# Patient Record
Sex: Female | Born: 1965
Health system: Southern US, Community
[De-identification: ages and names within clinical notes are randomized; demographics above are authoritative.]

## PROBLEM LIST (undated history)

## (undated) DIAGNOSIS — I10 Essential (primary) hypertension: Secondary | ICD-10-CM

## (undated) DIAGNOSIS — D573 Sickle-cell trait: Secondary | ICD-10-CM

## (undated) HISTORY — DX: Sickle-cell trait: D57.3

## (undated) HISTORY — PX: DIAGNOSTIC LAPAROSCOPY: SUR761

## (undated) HISTORY — PX: ABDOMINAL HYSTERECTOMY: SHX81

---

## 1993-12-20 HISTORY — PX: TUBAL LIGATION: SHX77

## 2006-12-01 ENCOUNTER — Ambulatory Visit: Payer: Self-pay | Admitting: Obstetrics & Gynecology

## 2006-12-01 ENCOUNTER — Encounter: Payer: Self-pay | Admitting: Obstetrics & Gynecology

## 2006-12-22 ENCOUNTER — Ambulatory Visit: Payer: Self-pay | Admitting: Obstetrics & Gynecology

## 2009-08-10 ENCOUNTER — Emergency Department: Payer: Self-pay | Admitting: Emergency Medicine

## 2011-05-04 NOTE — Assessment & Plan Note (Signed)
NAME:  Stephanie Jacobs, Stephanie Jacobs              ACCOUNT NO.:  [id number]   MEDICAL RECORD NO.:  [id number]          PATIENT TYPE:  POB   LOCATION:  [facility]         FACILITY:  [facility]   PHYSICIAN:  Elsie Lincoln, MD      DATE OF BIRTH:  1966/08/02   DATE OF SERVICE:  12/01/2006                                  CLINIC NOTE   The patient is a 45 year old para 4 female who presents for evaluation  of abnormal uterine bleeding in the past year but it has gotten worse  over the past 2 months.  The patient had regular menses up until this  point and started to have more frequent long periods of bleeding.  The  past 2 months, the patient had worsening of her symptoms.  On 10/10, she  had a period from the 10th to approximately the 14th.  In November, she  started early November and ended early December.  The patient does not  state any cramping, nausea, vomiting or change in urinary habits.   PAST MEDICAL HISTORY:  1. Hypertension but the patient does not take her medications for it.      She used to be on Toprol XL 50 mg a day; however, when she started      to feel better and after she had a negative stress test and MRI of      her heart, she decided she was not taking it.  Today, her blood      pressure is 170/106 and a recheck was 160/108.  The patient needs      to see her MD immediately for this and we will give her a      prescription for Toprol XL 50 to take until she talks to her      doctor.  2. She also as history of borderline diabetes but she lost some weight      and she said her last test was normal.   PAST SURGICAL HISTORY:  Bilateral tubal ligation.   GYN HISTORY:  Four vaginal deliveries.  No STDs.  No fibroids.  No  cysts.  Never had a transvaginal ultrasound.  She had __________  13  years ago for abnormal Pap smear and Pap smears have been normal since.   FAMILY HISTORY:  Negative for familial GYN cancer or breast cancer.   ALLERGIES:  Denies latex or medication  allergies.   REVIEW OF SYSTEMS:  Negative.   LABS TODAY:  UBT is negative.   PHYSICAL EXAMINATION:  VITAL SIGNS:  Blood pressure 170/106, recheck  160/108, weight 201, pulse 71.  GENERAL:  Well developed, well nourished adult in no apparent distress.  ABDOMEN:  Soft, slightly obese, nontender.  No rebound or guarding.  GENITALIA:  Tanner 5.  Vagina pink.  Normal rugae.  No blood or  discharge.  Bladder and urethra well-suspended and supported.  Cervix  closed, nontender.  Uterus approximately 8 weeks size, mobile, anterior.  Adnexa:  No masses nontender.  Perineum intact.   ASSESSMENT/PLAN:  A 45 year old female with menorrhagia.  1. The patient is at an age where pre-cancer and cancer of the uterus      need to  be ruled out, so endometrial biopsy was done today.  Good      tissue was seen in the pathology specimen.  UBT negative.  2. Uncontrolled hypertension.  Toprol XL given.  The patient to call      her doctor today, immediately.  No refills were given on this      medication.  3. Pap smear done as her last one was done in 11/06.  4. Return to clinic in 2 weeks for results.           ______________________________  Elsie Lincoln, MD     KL/MEDQ  D:  12/01/2006  T:  12/01/2006  Job:  [id number]

## 2011-05-07 NOTE — Assessment & Plan Note (Signed)
Stephanie Jacobs, Stephanie Jacobs              ACCOUNT NO.:  [id number]   MEDICAL RECORD NO.:  [id number]          PATIENT TYPE:  POB   LOCATION:  [facility]         FACILITY:  [facility]   PHYSICIAN:  Carolanne Grumbling, M.D.   DATE OF BIRTH:  07-26-1966   DATE OF SERVICE:  12/22/2006                                  CLINIC NOTE   A 45 year old para 4 presents for results of workup for abnormal uterine  bleeding.  She had an endometrial biopsy done at her last office visit  that showed benign proliferative endometrium, no hyperplasia or  malignancy.  Pap smear was also negative.  Since her office visit, she  had another period, December 26th through December 31st, that was  normal, but it was her second one of the month.   PHYSICAL EXAMINATION:  VITAL SIGNS:  Today on exam, patient was found to  have a blood pressure of 156/106, pulse 74.  GENERAL:  She is a well-developed, well-nourished female in no apparent  distress.  ABDOMEN:  Obese.  GU:  Normal external genitalia, cervix multiparous.  GC/CT collected.   IMPRESSION:  1. Menorrhagia with normal endometrial biopsy.  Patient was counseled      that her options included nonestrogen methods, including Depo-      Provera or a Mirena IUD.  Patient would like to proceed with      getting the Mirena.  She is to come back in approximately 2 weeks      for the insertion.  Handout was given and all her questions were      answered.  2. Hypertension.  Patient is being seen by a primary care physician.      She is on lisinopril and hydrochlorothiazide.  3. Patient was discussed with Dr. Penne Lash who agrees with the      assessment and plan.           ______________________________  Carolanne Grumbling, M.D.     TW/MEDQ  D:  12/22/2006  T:  12/22/2006  Job:  [id number]

## 2012-03-05 ENCOUNTER — Emergency Department: Payer: Self-pay | Admitting: Emergency Medicine

## 2012-03-05 LAB — TSH: Thyroid Stimulating Horm: 5.01 u[IU]/mL — ABNORMAL HIGH

## 2012-03-05 LAB — URINALYSIS, COMPLETE
Bacteria: NONE SEEN
Bilirubin,UR: NEGATIVE
Glucose,UR: NEGATIVE mg/dL (ref 0–75)
Ketone: NEGATIVE
Leukocyte Esterase: NEGATIVE
RBC,UR: <1 /HPF (ref 0–5)
Squamous Epithelial: NONE SEEN

## 2012-03-05 LAB — CK TOTAL AND CKMB (NOT AT ARMC): CK, Total: 239 U/L — ABNORMAL HIGH (ref 21–215)

## 2012-03-05 LAB — PROTIME-INR
INR: 0.9
Prothrombin Time: 12.1 secs (ref 11.5–14.7)

## 2012-03-05 LAB — COMPREHENSIVE METABOLIC PANEL
Albumin: 4.7 g/dL (ref 3.4–5.0)
Alkaline Phosphatase: 66 U/L (ref 50–136)
Calcium, Total: 8.9 mg/dL (ref 8.5–10.1)
Co2: 23 mmol/L (ref 21–32)
EGFR (Non-African Amer.): >60
Glucose: 90 mg/dL (ref 65–99)
SGPT (ALT): 44 U/L

## 2012-03-05 LAB — PREGNANCY, URINE: Pregnancy Test, Urine: NEGATIVE m[IU]/mL

## 2014-11-18 ENCOUNTER — Ambulatory Visit: Payer: Self-pay | Admitting: Family Medicine

## 2015-03-11 LAB — CBC AND DIFFERENTIAL
HCT: 42 % (ref 36–46)
Hemoglobin: 14.7 g/dL (ref 12.0–16.0)
PLATELETS: 303 10*3/uL (ref 150–399)
WBC: 10 10^3/mL

## 2015-03-11 LAB — HEMOGLOBIN A1C: HEMOGLOBIN A1C: 5.2 % (ref 4.0–6.0)

## 2015-03-11 LAB — HEPATIC FUNCTION PANEL
ALT: 30 U/L (ref 7–35)
AST: 35 U/L (ref 13–35)

## 2015-04-08 LAB — BASIC METABOLIC PANEL
BUN: 7 mg/dL (ref 4–21)
CREATININE: 0.7 mg/dL (ref <=1.1)
GLUCOSE: 94 mg/dL
Potassium: 3.9 mmol/L (ref 3.4–5.3)
Sodium: 137 mmol/L (ref 137–147)

## 2015-05-07 DIAGNOSIS — R739 Hyperglycemia, unspecified: Secondary | ICD-10-CM | POA: Insufficient documentation

## 2015-05-07 DIAGNOSIS — R945 Abnormal results of liver function studies: Secondary | ICD-10-CM | POA: Insufficient documentation

## 2015-05-07 DIAGNOSIS — F419 Anxiety disorder, unspecified: Secondary | ICD-10-CM | POA: Insufficient documentation

## 2015-05-07 DIAGNOSIS — R7989 Other specified abnormal findings of blood chemistry: Secondary | ICD-10-CM | POA: Insufficient documentation

## 2015-05-07 DIAGNOSIS — Z7189 Other specified counseling: Secondary | ICD-10-CM | POA: Insufficient documentation

## 2015-05-07 DIAGNOSIS — I10 Essential (primary) hypertension: Secondary | ICD-10-CM | POA: Insufficient documentation

## 2015-05-23 ENCOUNTER — Other Ambulatory Visit: Payer: Self-pay | Admitting: Family Medicine

## 2015-05-23 DIAGNOSIS — I10 Essential (primary) hypertension: Secondary | ICD-10-CM

## 2015-07-09 ENCOUNTER — Other Ambulatory Visit: Payer: Self-pay | Admitting: Family Medicine

## 2015-07-09 ENCOUNTER — Ambulatory Visit: Payer: Self-pay | Admitting: Family Medicine

## 2015-07-09 DIAGNOSIS — I10 Essential (primary) hypertension: Secondary | ICD-10-CM

## 2015-07-09 NOTE — Telephone Encounter (Signed)
Last OV 03/2015;  She missed today's appointment.   Thanks,   -Mickel Baas

## 2015-08-20 ENCOUNTER — Emergency Department
Admission: EM | Admit: 2015-08-20 | Discharge: 2015-08-20 | Discharge status: Against Medical Advice | Payer: BLUE CROSS/BLUE SHIELD | Attending: Emergency Medicine | Admitting: Emergency Medicine

## 2015-08-20 ENCOUNTER — Encounter: Payer: Self-pay | Admitting: *Deleted

## 2015-08-20 DIAGNOSIS — R040 Epistaxis: Secondary | ICD-10-CM | POA: Diagnosis present

## 2015-08-20 NOTE — ED Notes (Addendum)
Pt states while in the bathroom tonight, her nose started bleeding from both nares.  Pt reports sinus pressure and congestion this week.  cig smoker.   Pt has nose clip in place.  No bleeding presently in triage.   Blood pressure elevated.  Pt reports no bp meds for past 2 days.  No headache.  Alert.  Speech clear.

## 2015-08-20 NOTE — ED Notes (Signed)
Pt ambulatory to STAT without difficulty or distress; reports nosebleed today; currently bleeding for 15min, bilat; st recent ?sinus congestion/allergies; noseclamp applied

## 2015-09-12 ENCOUNTER — Telehealth: Payer: Self-pay | Admitting: Family Medicine

## 2015-09-12 NOTE — Telephone Encounter (Signed)
Left message requesting pt make an OV. Renaldo Fiddler, CMA

## 2015-09-12 NOTE — Telephone Encounter (Signed)
Pt is calling to request a lab slip.  Pt states she is having cramps in her leg, toes and sides. Pt also states her urine is dark. Pt is asking if this is coming from her medication? FD#[phone]/[id number]

## 2018-01-19 ENCOUNTER — Ambulatory Visit (INDEPENDENT_AMBULATORY_CARE_PROVIDER_SITE_OTHER): Payer: BLUE CROSS/BLUE SHIELD | Admitting: Physician Assistant

## 2018-01-19 ENCOUNTER — Other Ambulatory Visit: Payer: Self-pay | Admitting: Physician Assistant

## 2018-01-19 ENCOUNTER — Encounter: Payer: Self-pay | Admitting: Physician Assistant

## 2018-01-19 VITALS — BP 164/102 | HR 72 | Temp 98.5°F | Resp 16 | Ht 65.0 in | Wt 206.0 lb

## 2018-01-19 DIAGNOSIS — Z13220 Encounter for screening for lipoid disorders: Secondary | ICD-10-CM

## 2018-01-19 DIAGNOSIS — I10 Essential (primary) hypertension: Secondary | ICD-10-CM

## 2018-01-19 DIAGNOSIS — Z130 Encounter for screening for diseases of the blood and blood-forming organs and certain disorders involving the immune mechanism: Secondary | ICD-10-CM | POA: Diagnosis not present

## 2018-01-19 DIAGNOSIS — R739 Hyperglycemia, unspecified: Secondary | ICD-10-CM

## 2018-01-19 DIAGNOSIS — Z1329 Encounter for screening for other suspected endocrine disorder: Secondary | ICD-10-CM

## 2018-01-19 MED ORDER — AMLODIPINE BESYLATE 5 MG PO TABS: 5.0000 mg | ORAL_TABLET | Freq: Every day | ORAL | 0 refills | Status: DC

## 2018-01-19 NOTE — Patient Instructions (Signed)

## 2018-01-19 NOTE — Progress Notes (Signed)
Patient: Stephanie Jacobs Female    DOB: 29-May-1966   52 y.o.   MRN: [id number] Visit Date: 01/20/2018  Today's Provider: Trinna Post, PA-C   Chief Complaint  Patient presents with  . Hypertension   Subjective:    Stephanie Jacobs is a 52 y/o woman here today for follow up. Last visit was 03/2015 with Dr. Venia Minks. She is living in [place] with her granddaughter and younger son. She has four kids, all grown. One daughter with MS.  Works as Marine scientist at [employer].  She has smoked 1 pack of cigarettes per day since age 52, total 70 years. Drinks glass of wine per night.   Last PAP listed in chart as 2015 though I cannot find lab report.   She has a history of HTN. She was previously on 10 mg amlodipine QD and Lisinopril-HCTZ 12-25 mg QD for this. Last visit 03/2015 where, per chart review in [place], Dr. Venia Minks advised her to continue medications. The patient relates that she "resolved" her hypertension and was discontinued from her medications which is why she hasn't been taking them.  She reports regular but heavy periods. Had ultrasound in 2008 to check for fibroids and there were none.  Hypertension  This is a chronic problem. The problem has been resolved since onset. The problem is controlled (Pt reports her BP is usually 120's/80's at home.). Pertinent negatives include no anxiety, blurred vision, chest pain, headaches, malaise/fatigue, neck pain, orthopnea, palpitations, peripheral edema, PND, shortness of breath or sweats.       Allergies  Allergen Reactions  . Tape      Current Outpatient Medications:  .  albuterol (PROVENTIL HFA;VENTOLIN HFA) 108 (90 BASE) MCG/ACT inhaler, Inhale into the lungs., Disp: , Rfl:  .  amLODipine (NORVASC) 5 MG tablet, Take 1 tablet (5 mg total) by mouth daily., Disp: 90 tablet, Rfl: 0 .  aspirin 81 MG tablet, Take by mouth., Disp: , Rfl:  .  lisinopril-hydrochlorothiazide (PRINZIDE,ZESTORETIC) 10-12.5 MG per  tablet, Take by mouth., Disp: , Rfl:   Review of Systems  Constitutional: Negative.  Negative for malaise/fatigue.  Eyes: Negative for blurred vision.  Respiratory: Negative for shortness of breath.   Cardiovascular: Negative for chest pain, palpitations, orthopnea and PND.  Musculoskeletal: Negative for neck pain.  Neurological: Negative for dizziness, light-headedness and headaches.   Family History  Problem Relation Age of Onset  . Hypertension Mother   . Diabetes Mother   . Sickle cell anemia Brother   . Sickle cell trait Brother    Past Surgical History:  Procedure Laterality Date  . TUBAL LIGATION  1995    Social History   Tobacco Use  . Smoking status: Current Every Day Smoker    Packs/day: 0.50    Types: Cigarettes  . Smokeless tobacco: Never Used  Substance Use Topics  . Alcohol use: Yes    Alcohol/week: 4.2 oz    Types: 7 Glasses of wine per week    Comment: One glass of red wine in the evenings.   Objective:   BP (!) 164/102 (BP Location: Right Arm, Patient Position: Sitting, Cuff Size: Normal)   Pulse 72   Temp 98.5 F (36.9 C) (Oral)   Resp 16   Ht 5\' 5"  (1.651 m)   Wt 206 lb (93.4 kg)   BMI 34.28 kg/m  Vitals:   01/19/18 1529  BP: (!) 164/102  Pulse: 72  Resp: 16  Temp: 98.5  F (36.9 C)  TempSrc: Oral  Weight: 206 lb (93.4 kg)  Height: 5\' 5"  (1.651 m)     Physical Exam  Constitutional: She appears well-developed and well-nourished.  Cardiovascular: Normal rate and regular rhythm.  Pulmonary/Chest: Effort normal and breath sounds normal.  Abdominal: Soft. Bowel sounds are normal.  Skin: Skin is warm and dry.  Psychiatric: She has a normal mood and affect. Her behavior is normal.        Assessment & Plan:     1. Hypertension, unspecified type  Hypertensive today. Last visit with Dr. Venia Minks 03/2015 BP was 140/100 and instructions were to continue medications and follow up. Patient not released from care for hypertension. Recommend  restarting medications and getting labwork. Will likely need multiple medications to control. Needs to stop smoking, increase exercise and focus on weight reduction. Return 1 mo to check BP, complete CPE w/ PAP.  - Comprehensive Metabolic Panel (CMET) - amLODipine (NORVASC) 5 MG tablet; Take 1 tablet (5 mg total) by mouth daily.  Dispense: 90 tablet; Refill: 0  2. Blood glucose elevated   3. Thyroid disorder screening  - TSH  4. Screening for deficiency anemia  - CBC with Differential  5. Lipid screening  - Lipid Profile  6. Hyperglycemia  - HgB A1c  Return in about 1 month (around 02/16/2018) for CPE w PAP.  The entirety of the information documented in the History of Present Illness, Review of Systems and Physical Exam were personally obtained by me. Portions of this information were initially documented by Ashley Royalty, CMA and reviewed by me for thoroughness and accuracy.        Trinna Post, PA-C  [facility]

## 2018-01-20 ENCOUNTER — Telehealth: Payer: Self-pay | Admitting: Emergency Medicine

## 2018-01-20 LAB — CBC WITH DIFFERENTIAL/PLATELET
Basophils Absolute: 0 10*3/uL (ref 0.0–0.2)
Basos: 0 %
EOS (ABSOLUTE): 0.2 10*3/uL (ref 0.0–0.4)
Eos: 2 %
Hematocrit: 43.3 % (ref 34.0–46.6)
Hemoglobin: 14.3 g/dL (ref 11.1–15.9)
Immature Grans (Abs): 0 10*3/uL (ref 0.0–0.1)
Immature Granulocytes: 0 %
Lymphocytes Absolute: 2.4 10*3/uL (ref 0.7–3.1)
Lymphs: 26 %
MCH: 30.1 pg (ref 26.6–33.0)
MCHC: 33 g/dL (ref 31.5–35.7)
MCV: 91 fL (ref 79–97)
Monocytes Absolute: 0.7 10*3/uL (ref 0.1–0.9)
Monocytes: 7 %
Neutrophils Absolute: 6.2 10*3/uL (ref 1.4–7.0)
Neutrophils: 65 %
Platelets: 293 10*3/uL (ref 150–379)
RBC: 4.75 x10E6/uL (ref 3.77–5.28)
RDW: 15.4 % (ref 12.3–15.4)
WBC: 9.4 10*3/uL (ref 3.4–10.8)

## 2018-01-20 LAB — COMPREHENSIVE METABOLIC PANEL
ALT: 38 IU/L — ABNORMAL HIGH (ref 0–32)
AST: 37 IU/L (ref 0–40)
Albumin/Globulin Ratio: 1.7 (ref 1.2–2.2)
Albumin: 4.8 g/dL (ref 3.5–5.5)
Alkaline Phosphatase: 61 IU/L (ref 39–117)
BUN/Creatinine Ratio: 11 (ref 9–23)
BUN: 8 mg/dL (ref 6–24)
Bilirubin Total: 0.7 mg/dL (ref 0.0–1.2)
CO2: 23 mmol/L (ref 20–29)
Calcium: 9.3 mg/dL (ref 8.7–10.2)
Chloride: 101 mmol/L (ref 96–106)
Creatinine, Ser: 0.73 mg/dL (ref 0.57–1.00)
GFR calc Af Amer: 112 mL/min/{1.73_m2} (ref >59)
GFR calc non Af Amer: 97 mL/min/{1.73_m2} (ref >59)
Globulin, Total: 2.9 g/dL (ref 1.5–4.5)
Glucose: 90 mg/dL (ref 65–99)
Potassium: 3.8 mmol/L (ref 3.5–5.2)
Sodium: 139 mmol/L (ref 134–144)
Total Protein: 7.7 g/dL (ref 6.0–8.5)

## 2018-01-20 LAB — LIPID PANEL
Chol/HDL Ratio: 2.8 ratio (ref 0.0–4.4)
Cholesterol, Total: 182 mg/dL (ref 100–199)
HDL: 65 mg/dL (ref >39)
LDL Calculated: 101 mg/dL — ABNORMAL HIGH (ref 0–99)
Triglycerides: 82 mg/dL (ref 0–149)
VLDL Cholesterol Cal: 16 mg/dL (ref 5–40)

## 2018-01-20 LAB — TSH: TSH: 1.62 u[IU]/mL (ref 0.450–4.500)

## 2018-01-20 LAB — HEMOGLOBIN A1C
Est. average glucose Bld gHb Est-mCnc: 105 mg/dL
Hgb A1c MFr Bld: 5.3 % (ref 4.8–5.6)

## 2018-01-20 NOTE — Telephone Encounter (Signed)
LMTCB

## 2018-01-20 NOTE — Telephone Encounter (Signed)
-----   Message from Trinna Post, [place] sent at 01/20/2018  1:26 PM EST ----- CBC and CMET normal. Cholesterol well controlled, A1c normal. TSH normal.

## 2018-01-23 ENCOUNTER — Telehealth: Payer: Self-pay

## 2018-01-23 NOTE — Telephone Encounter (Signed)
Tried calling patient and number was busy. Will try again later.

## 2018-01-23 NOTE — Telephone Encounter (Signed)
Advised patient of results.  

## 2018-01-23 NOTE — Telephone Encounter (Signed)
-----   Message from Trinna Post, [place] sent at 01/20/2018  1:26 PM EST ----- CBC and CMET normal. Cholesterol well controlled, A1c normal. TSH normal.

## 2018-02-22 ENCOUNTER — Ambulatory Visit (INDEPENDENT_AMBULATORY_CARE_PROVIDER_SITE_OTHER): Payer: BLUE CROSS/BLUE SHIELD | Admitting: Physician Assistant

## 2018-02-22 ENCOUNTER — Encounter: Payer: Self-pay | Admitting: Physician Assistant

## 2018-02-22 VITALS — BP 162/92 | HR 90 | Temp 98.6°F | Resp 16 | Ht 63.0 in | Wt 205.0 lb

## 2018-02-22 DIAGNOSIS — Z1239 Encounter for other screening for malignant neoplasm of breast: Secondary | ICD-10-CM

## 2018-02-22 DIAGNOSIS — I10 Essential (primary) hypertension: Secondary | ICD-10-CM

## 2018-02-22 DIAGNOSIS — Z0001 Encounter for general adult medical examination with abnormal findings: Secondary | ICD-10-CM

## 2018-02-22 DIAGNOSIS — Z720 Tobacco use: Secondary | ICD-10-CM

## 2018-02-22 DIAGNOSIS — Z0000 Encounter for general adult medical examination without abnormal findings: Secondary | ICD-10-CM

## 2018-02-22 DIAGNOSIS — Z1211 Encounter for screening for malignant neoplasm of colon: Secondary | ICD-10-CM | POA: Diagnosis not present

## 2018-02-22 DIAGNOSIS — Z124 Encounter for screening for malignant neoplasm of cervix: Secondary | ICD-10-CM

## 2018-02-22 DIAGNOSIS — F1010 Alcohol abuse, uncomplicated: Secondary | ICD-10-CM

## 2018-02-22 MED ORDER — BUPROPION HCL ER (SR) 150 MG PO TB12: ORAL_TABLET | ORAL | 0 refills | Status: DC

## 2018-02-22 MED ORDER — AMLODIPINE BESYLATE 10 MG PO TABS: 10.0000 mg | ORAL_TABLET | Freq: Every day | ORAL | 3 refills | Status: DC

## 2018-02-22 NOTE — Progress Notes (Signed)
Patient: Stephanie Jacobs, Female    DOB: August 04, 1966, 52 y.o.   MRN: [id number] Visit Date: 02/23/2018  Today's Provider: Trinna Post, PA-C   Chief Complaint  Patient presents with  . Annual Exam   Subjective:     Annual physical exam Stephanie Jacobs is a 52 y.o. female who presents today for health maintenance and complete physical. She feels well. She reports exercising not regularly. She reports she is sleeping well.  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 She is in a relationship. She has grown children.  She is smoking currently and is interested in quitting. Has tried the patch and Chantix previously without success. She is interested in Wellbutrin today.   She is due for a mammogram and PAP today but she has started her period.   She is wondering about 81 mg ASA daily.   She drinks three glasses of wine daily.   Hypertension, follow-up:  BP Readings from Last 3 Encounters:  02/22/18 (!) 162/92  01/19/18 (!) 164/102  08/20/15 (!) 176/110    She was last seen for hypertension 2 months ago.  BP at that visit was 164/102. Management since that visit includes restarting amlodipine 5mg  daily. She reports good compliance with treatment. She is not having side effects.  She is not exercising. She is adherent to low salt diet.   Outside blood pressures are not being checked. She is experiencing none.  Patient denies exertional chest pressure/discomfort, lower extremity edema and palpitations.   Cardiovascular risk factors include obesity (BMI >= 30 kg/m2) and smoking/ tobacco exposure.      Weight trend: stable Wt Readings from Last 3 Encounters:  02/22/18 205 lb (93 kg)  01/19/18 206 lb (93.4 kg)  08/20/15 193 lb (87.5 kg)    Current diet: well balanced    Review of Systems  Constitutional: Negative.   HENT: Negative.   Eyes: Negative.   Respiratory: Negative.   Cardiovascular: Negative.     Gastrointestinal: Negative.   Endocrine: Negative.   Genitourinary: Negative.   Musculoskeletal: Negative.   Skin: Negative.   Allergic/Immunologic: Negative.   Neurological: Negative.   Hematological: Negative.   Psychiatric/Behavioral: Negative.     Social History      She  reports that she has been smoking cigarettes.  She has been smoking about 0.50 packs per day. she has never used smokeless tobacco. She reports that she drinks about 4.2 oz of alcohol per week. She reports that she does not use drugs.       Social History   Socioeconomic History  . Marital status: Single    Spouse name: None  . Number of children: None  . Years of education: None  . Highest education level: None  Social Needs  . Financial resource strain: None  . Food insecurity - worry: None  . Food insecurity - inability: None  . Transportation needs - medical: None  . Transportation needs - non-medical: None  Occupational History  . None  Tobacco Use  . Smoking status: Current Every Day Smoker    Packs/day: 0.50    Types: Cigarettes  . Smokeless tobacco: Never Used  Substance and Sexual Activity  . Alcohol use: Yes    Alcohol/week: 4.2 oz    Types: 7 Glasses of wine per week    Comment: One glass of red wine in the evenings.  . Drug use: No  .  Sexual activity: No  Other Topics Concern  . None  Social History Narrative  . None    Past Medical History:  Diagnosis Date  . Sickle cell trait [facility])      Patient Active Problem List   Diagnosis Date Noted  . Anxiety 05/07/2015  . Abnormal LFTs 05/07/2015  . Essential (primary) hypertension 05/07/2015  . Blood glucose elevated 05/07/2015  . Counseling on substance use and abuse 05/07/2015    Past Surgical History:  Procedure Laterality Date  . TUBAL LIGATION  1995    Family History        Family Status  Relation Name Status  . Mother  Alive  . Father  Alive  . Brother  Deceased  . Brother  (Not Specified)        Her family  history includes Diabetes in her mother; Hypertension in her mother; Sickle cell anemia in her brother; Sickle cell trait in her brother.      Allergies  Allergen Reactions  . Tape      Current Outpatient Medications:  .  albuterol (PROVENTIL HFA;VENTOLIN HFA) 108 (90 BASE) MCG/ACT inhaler, Inhale into the lungs., Disp: , Rfl:  .  aspirin 81 MG tablet, Take by mouth., Disp: , Rfl:  .  amLODipine (NORVASC) 10 MG tablet, Take 1 tablet (10 mg total) by mouth daily., Disp: 90 tablet, Rfl: 3 .  buPROPion (WELLBUTRIN SR) 150 MG 12 hr tablet, Take one pill daily for three days. On day 4, take one pill twice daily for remainder of treatment, Disp: 180 tablet, Rfl: 0 .  lisinopril-hydrochlorothiazide (PRINZIDE,ZESTORETIC) 10-12.5 MG per tablet, Take by mouth., Disp: , Rfl:    Patient Care Team: Paulene Floor as PCP - General (Physician Assistant)      Objective:   Vitals: BP (!) 162/92 (BP Location: Right Arm, Patient Position: Sitting, Cuff Size: Large)   Pulse 90   Temp 98.6 F (37 C)   Resp 16   Ht 5\' 3"  (1.6 m)   Wt 205 lb (93 kg)   SpO2 96%   BMI 36.31 kg/m    Vitals:   02/22/18 1408  BP: (!) 162/92  Pulse: 90  Resp: 16  Temp: 98.6 F (37 C)  SpO2: 96%  Weight: 205 lb (93 kg)  Height: 5\' 3"  (1.6 m)     Physical Exam  Constitutional: She is oriented to person, place, and time. She appears well-developed and well-nourished.  Cardiovascular: Normal rate and regular rhythm.  Pulmonary/Chest: Effort normal and breath sounds normal.  Abdominal: Soft. Bowel sounds are normal.  Neurological: She is alert and oriented to person, place, and time.  Skin: Skin is warm and dry.  Psychiatric: She has a normal mood and affect. Her behavior is normal.     Depression Screen PHQ 2/9 Scores 02/22/2018  PHQ - 2 Score 0      Assessment & Plan:     Routine Health Maintenance and Physical Exam  Exercise Activities and Dietary recommendations Goals    None        There is no immunization history on file for this patient.  Health Maintenance  Topic Date Due  . HIV Screening  12/07/1983  . TETANUS/TDAP  12/07/1987  . INFLUENZA VACCINE  07/20/2018 (Originally 07/20/2017)  . PAP SMEAR  01/18/2019     Discussed health benefits of physical activity, and encouraged her to engage in regular exercise appropriate for her age and condition.    1. Annual physical exam  2. Breast cancer screening  Deferred breast and PAP to follow up visit due to menstrual cycle.  - MM Digital Screening; Future  3. Cervical cancer screening  See #2  4. Hypertension, unspecified type  BP slightly decreased on amlodipine. Will increase to 10 mg and see back next week. Anticipate having to add back Lisinopril- HCTZ.  - amLODipine (NORVASC) 10 MG tablet; Take 1 tablet (10 mg total) by mouth daily.  Dispense: 90 tablet; Refill: 3  5. Tobacco abuse  I have counseled >74min on smoking cessation.  - buPROPion (WELLBUTRIN SR) 150 MG 12 hr tablet; Take one pill daily for three days. On day 4, take one pill twice daily for remainder of treatment  Dispense: 180 tablet; Refill: 0  6. Excessive drinking alcohol  Counseled that serving of alcohol is 5 oz wine daily for women.  7. Colon cancer screening  She will be 52 in December.  - Ambulatory referral to Gastroenterology  Return in about 2 weeks (around 03/08/2018) for HTN, breast exam, PAP.  The entirety of the information documented in the History of Present Illness, Review of Systems and Physical Exam were personally obtained by me. Portions of this information were initially documented by Jacqualine Code, CMA and reviewed by me for thoroughness and accuracy.      Trinna Post, PA-C  [facility]

## 2018-02-23 NOTE — Patient Instructions (Signed)

## 2018-03-10 ENCOUNTER — Encounter: Payer: Self-pay | Admitting: Physician Assistant

## 2018-03-10 ENCOUNTER — Ambulatory Visit (INDEPENDENT_AMBULATORY_CARE_PROVIDER_SITE_OTHER): Payer: BLUE CROSS/BLUE SHIELD | Admitting: Physician Assistant

## 2018-03-10 VITALS — BP 144/96 | HR 84 | Temp 98.3°F | Resp 16 | Wt 208.0 lb

## 2018-03-10 DIAGNOSIS — Z124 Encounter for screening for malignant neoplasm of cervix: Secondary | ICD-10-CM

## 2018-03-10 DIAGNOSIS — I10 Essential (primary) hypertension: Secondary | ICD-10-CM

## 2018-03-10 MED ORDER — LISINOPRIL-HYDROCHLOROTHIAZIDE 10-12.5 MG PO TABS: 1.0000 | ORAL_TABLET | Freq: Every day | ORAL | 3 refills | Status: DC

## 2018-03-10 NOTE — Patient Instructions (Signed)

## 2018-03-10 NOTE — Progress Notes (Signed)
Patient: Stephanie Jacobs Female    DOB: 03/10/1966   52 y.o.   MRN: [id number] Visit Date: 03/10/2018  Today's Provider: Trinna Post, PA-C   Chief Complaint  Patient presents with  . Hypertension   Subjective:    Hypertension  This is a chronic problem. Pertinent negatives include no anxiety, blurred vision, chest pain, headaches, malaise/fatigue, neck pain, orthopnea, palpitations, peripheral edema, PND, shortness of breath or sweats. There are no associated agents to hypertension.       Allergies  Allergen Reactions  . Tape      Current Outpatient Medications:  .  amLODipine (NORVASC) 10 MG tablet, Take 1 tablet (10 mg total) by mouth daily., Disp: 90 tablet, Rfl: 3 .  albuterol (PROVENTIL HFA;VENTOLIN HFA) 108 (90 BASE) MCG/ACT inhaler, Inhale into the lungs., Disp: , Rfl:  .  aspirin 81 MG tablet, Take by mouth., Disp: , Rfl:  .  buPROPion (WELLBUTRIN SR) 150 MG 12 hr tablet, Take one pill daily for three days. On day 4, take one pill twice daily for remainder of treatment (Patient not taking: Reported on 03/10/2018), Disp: 180 tablet, Rfl: 0 .  lisinopril-hydrochlorothiazide (PRINZIDE,ZESTORETIC) 10-12.5 MG per tablet, Take by mouth., Disp: , Rfl:   Review of Systems  Constitutional: Negative for malaise/fatigue.  Eyes: Negative for blurred vision.  Respiratory: Negative for shortness of breath.   Cardiovascular: Negative for chest pain, palpitations, orthopnea and PND.  Musculoskeletal: Negative for neck pain.  Neurological: Negative for headaches.    Social History   Tobacco Use  . Smoking status: Current Every Day Smoker    Packs/day: 0.50    Types: Cigarettes  . Smokeless tobacco: Never Used  Substance Use Topics  . Alcohol use: Yes    Alcohol/week: 4.2 oz    Types: 7 Glasses of wine per week    Comment: One glass of red wine in the evenings.   Objective:   BP (!) 144/96 (BP Location: Right Arm, Patient Position: Sitting, Cuff Size: Large)    Pulse 84   Temp 98.3 F (36.8 C) (Oral)   Resp 16   Wt 208 lb (94.3 kg)   LMP 02/24/2018   BMI 36.85 kg/m  Vitals:   03/10/18 1408  BP: (!) 144/96  Pulse: 84  Resp: 16  Temp: 98.3 F (36.8 C)  TempSrc: Oral  Weight: 208 lb (94.3 kg)     Physical Exam  Cardiovascular: Normal rate and regular rhythm.  Pulmonary/Chest: Breath sounds normal. Right breast exhibits no inverted nipple, no mass, no nipple discharge, no skin change and no tenderness. Left breast exhibits no inverted nipple, no mass, no nipple discharge, no skin change and no tenderness. No breast swelling, tenderness, discharge or bleeding. Breasts are symmetrical.  Genitourinary: No labial fusion. There is no rash, tenderness, lesion or injury on the right labia. There is no rash, tenderness, lesion or injury on the left labia. Uterus is not deviated, not enlarged, not fixed and not tender. Cervix exhibits no motion tenderness, no discharge and no friability. Right adnexum displays no mass, no tenderness and no fullness. Left adnexum displays no mass, no tenderness and no fullness. No erythema, tenderness or bleeding in the vagina. No foreign body in the vagina. No signs of injury around the vagina. No vaginal discharge found.        Assessment & Plan:     1. Essential (primary) hypertension  HTN improved but still uncontrolled. Will have her add medication below,  which she was previously on. F/u 1 mo for BP check and labs.  - lisinopril-hydrochlorothiazide (PRINZIDE,ZESTORETIC) 10-12.5 MG tablet; Take 1 tablet by mouth daily.  Dispense: 90 tablet; Refill: 3  2. Cervical cancer screening  Repeat 5 years if normal. Reminded to schedule mammogram.  - Pap IG and HPV (high risk) DNA detection  Return in about 1 month (around 04/10/2018) for HTN.  The entirety of the information documented in the History of Present Illness, Review of Systems and Physical Exam were personally obtained by me. Portions of this information  were initially documented by Ashley Royalty, CMA and reviewed by me for thoroughness and accuracy.        Trinna Post, PA-C  [facility]

## 2018-03-15 LAB — PAP IG AND HPV HIGH-RISK
HPV, high-risk: NEGATIVE
PAP Smear Comment: 0

## 2018-03-16 ENCOUNTER — Telehealth: Payer: Self-pay

## 2018-03-16 NOTE — Telephone Encounter (Signed)
Pt advised.   Thanks,   -Laura  

## 2018-03-16 NOTE — Telephone Encounter (Signed)
-----   Message from Trinna Post, [place] sent at 03/15/2018  4:42 PM EDT ----- PAP normal, HPV negative. Repeat 5 years.

## 2018-03-16 NOTE — Telephone Encounter (Signed)
LMTCB 030/28/2019  Thanks,   -Mickel Baas

## 2018-04-05 ENCOUNTER — Emergency Department: Payer: BLUE CROSS/BLUE SHIELD

## 2018-04-05 ENCOUNTER — Telehealth: Payer: Self-pay | Admitting: Physician Assistant

## 2018-04-05 ENCOUNTER — Emergency Department
Admission: EM | Admit: 2018-04-05 | Discharge: 2018-04-05 | Disposition: A | Discharge status: Home / Self Care | Payer: BLUE CROSS/BLUE SHIELD | Attending: Emergency Medicine | Admitting: Emergency Medicine

## 2018-04-05 ENCOUNTER — Encounter: Payer: Self-pay | Admitting: Physician Assistant

## 2018-04-05 ENCOUNTER — Encounter: Payer: Self-pay | Admitting: Emergency Medicine

## 2018-04-05 ENCOUNTER — Telehealth: Payer: Self-pay

## 2018-04-05 ENCOUNTER — Other Ambulatory Visit: Payer: Self-pay

## 2018-04-05 DIAGNOSIS — R6884 Jaw pain: Secondary | ICD-10-CM | POA: Diagnosis not present

## 2018-04-05 DIAGNOSIS — Z5321 Procedure and treatment not carried out due to patient leaving prior to being seen by health care provider: Secondary | ICD-10-CM | POA: Insufficient documentation

## 2018-04-05 DIAGNOSIS — R0789 Other chest pain: Secondary | ICD-10-CM | POA: Insufficient documentation

## 2018-04-05 DIAGNOSIS — M79602 Pain in left arm: Secondary | ICD-10-CM | POA: Diagnosis not present

## 2018-04-05 DIAGNOSIS — R079 Chest pain, unspecified: Secondary | ICD-10-CM | POA: Diagnosis not present

## 2018-04-05 HISTORY — DX: Essential (primary) hypertension: I10

## 2018-04-05 LAB — BASIC METABOLIC PANEL
Anion gap: 9 (ref 5–15)
BUN: 10 mg/dL (ref 6–20)
CHLORIDE: 99 mmol/L — AB (ref 101–111)
CO2: 27 mmol/L (ref 22–32)
Calcium: 9.3 mg/dL (ref 8.9–10.3)
Creatinine, Ser: 0.58 mg/dL (ref 0.44–1.00)
GFR calc non Af Amer: >60 mL/min (ref >60)
Glucose, Bld: 99 mg/dL (ref 65–99)
POTASSIUM: 3.5 mmol/L (ref 3.5–5.1)
Sodium: 135 mmol/L (ref 135–145)

## 2018-04-05 LAB — CBC
HEMATOCRIT: 41.8 % (ref 35.0–47.0)
Hemoglobin: 14.4 g/dL (ref 12.0–16.0)
MCH: 30.9 pg (ref 26.0–34.0)
MCHC: 34.5 g/dL (ref 32.0–36.0)
MCV: 89.6 fL (ref 80.0–100.0)
Platelets: 284 10*3/uL (ref 150–440)
RBC: 4.66 MIL/uL (ref 3.80–5.20)
RDW: 14.5 % (ref 11.5–14.5)
WBC: 8.2 10*3/uL (ref 3.6–11.0)

## 2018-04-05 LAB — TROPONIN I: Troponin I: <0.03 ng/mL (ref <0.03)

## 2018-04-05 NOTE — ED Triage Notes (Signed)
Pt here with c/o left sided jaw pain that woke her up last pm, pain radiated down her left arm and into her left breast, denies cardiac history, appears in no distress at this time.

## 2018-04-05 NOTE — Telephone Encounter (Signed)
error 

## 2018-04-05 NOTE — ED Notes (Signed)
Pt reports that she needs to leave to pick up her granddaughter up from school. Pt encouraged to stay. Pt states that she is not able to because she needs to pick up her granddaughter from school.

## 2018-04-05 NOTE — ED Triage Notes (Signed)
Pt denies any chest pain at this time, denies jaw or arm pain. Pt states she took 325mg  asa last night with episode, and pain subsided, does have hx of htn.

## 2018-04-05 NOTE — Telephone Encounter (Signed)
Patient called saying that she woke up with pain in her left jaw early this morning that radiates down the left side of her neck and to her left arm. She reports that she took an aspirin around 3am, went back to sleep and reports that she had chest pain when she woke up. She reports that she checked her BP and it was 156/113 in her right arm, and 151/111 in her left arm. Patient reports that she did take her BP medication this morning. Per our provider, patient needs to go to the ER for evaluation. Patient verbalized understanding.

## 2018-04-12 ENCOUNTER — Ambulatory Visit
Admission: RE | Admit: 2018-04-12 | Discharge: 2018-04-12 | Disposition: A | Discharge status: Home / Self Care | Payer: BLUE CROSS/BLUE SHIELD | Source: Ambulatory Visit | Attending: Physician Assistant | Admitting: Physician Assistant

## 2018-04-12 DIAGNOSIS — Z1231 Encounter for screening mammogram for malignant neoplasm of breast: Secondary | ICD-10-CM | POA: Insufficient documentation

## 2018-04-12 DIAGNOSIS — Z1239 Encounter for other screening for malignant neoplasm of breast: Secondary | ICD-10-CM

## 2018-04-14 ENCOUNTER — Ambulatory Visit (INDEPENDENT_AMBULATORY_CARE_PROVIDER_SITE_OTHER): Payer: BLUE CROSS/BLUE SHIELD | Admitting: Physician Assistant

## 2018-04-14 ENCOUNTER — Other Ambulatory Visit: Payer: Self-pay | Admitting: Physician Assistant

## 2018-04-14 VITALS — BP 146/86 | HR 86 | Temp 98.7°F | Resp 16 | Wt 205.0 lb

## 2018-04-14 DIAGNOSIS — I10 Essential (primary) hypertension: Secondary | ICD-10-CM | POA: Diagnosis not present

## 2018-04-14 DIAGNOSIS — N6489 Other specified disorders of breast: Secondary | ICD-10-CM

## 2018-04-14 DIAGNOSIS — R928 Other abnormal and inconclusive findings on diagnostic imaging of breast: Secondary | ICD-10-CM

## 2018-04-14 MED ORDER — LISINOPRIL-HYDROCHLOROTHIAZIDE 20-12.5 MG PO TABS: 1.0000 | ORAL_TABLET | Freq: Every day | ORAL | 0 refills | Status: DC

## 2018-04-14 NOTE — Patient Instructions (Signed)

## 2018-04-14 NOTE — Progress Notes (Signed)
Patient: Stephanie Jacobs Female    DOB: August 03, 1966   52 y.o.   MRN: [id number] Visit Date: 04/14/2018  Today's Provider: Trinna Post, PA-C   Chief Complaint  Patient presents with  . Hypertension   Subjective:    HPI  Hypertension, follow-up:  BP Readings from Last 3 Encounters:  04/14/18 (!) 146/86  03/10/18 (!) 144/96  02/22/18 (!) 162/92    She was last seen for hypertension 1 months ago.  BP at that visit was 144/96. Management since that visit includes add lisinopril/Hctz 10/12.5 . She reports good compliance with treatment. She is not having side effects.  She is not exercising. She is adherent to low salt diet.   Outside blood pressures are not being checked.  Patient denies chest pain, chest pressure/discomfort, claudication, dyspnea, exertional chest pressure/discomfort, fatigue, irregular heart beat, lower extremity edema, near-syncope, orthopnea, palpitations, paroxysmal nocturnal dyspnea, syncope and tachypnea.   Wt Readings from Last 3 Encounters:  04/14/18 205 lb (93 kg)  03/10/18 208 lb (94.3 kg)  02/22/18 205 lb (93 kg)   She was seen in the ER recently for jaw pain but left before treatment was completed. EKG shows some slight LVH and atrial enlargement. CMET unremarkable but for slightly low chloride at 99.  ------------------------------------------------------------------------    Allergies  Allergen Reactions  . Tape      Current Outpatient Medications:  .  amLODipine (NORVASC) 10 MG tablet, Take 1 tablet (10 mg total) by mouth daily., Disp: 90 tablet, Rfl: 3 .  aspirin 81 MG tablet, Take by mouth., Disp: , Rfl:  .  lisinopril-hydrochlorothiazide (PRINZIDE,ZESTORETIC) 10-12.5 MG tablet, Take 1 tablet by mouth daily., Disp: 90 tablet, Rfl: 3 .  Multiple Vitamin (MULTIVITAMIN) tablet, Take 1 tablet by mouth daily., Disp: , Rfl:  .  albuterol (PROVENTIL HFA;VENTOLIN HFA) 108 (90 BASE) MCG/ACT inhaler, Inhale into the lungs., Disp: ,  Rfl:  .  buPROPion (WELLBUTRIN SR) 150 MG 12 hr tablet, Take one pill daily for three days. On day 4, take one pill twice daily for remainder of treatment (Patient not taking: Reported on 03/10/2018), Disp: 180 tablet, Rfl: 0  Review of Systems  Constitutional: Negative.   HENT: Negative.   Eyes: Negative.   Respiratory: Negative.   Cardiovascular: Negative.   Gastrointestinal: Negative.   Endocrine: Negative.   Genitourinary: Negative.   Musculoskeletal: Positive for arthralgias.  Skin: Negative.   Allergic/Immunologic: Negative.   Neurological: Negative.   Hematological: Negative.   Psychiatric/Behavioral: Negative.     Social History   Tobacco Use  . Smoking status: Current Every Day Smoker    Packs/day: 0.50    Types: Cigarettes  . Smokeless tobacco: Never Used  Substance Use Topics  . Alcohol use: Yes    Alcohol/week: 4.2 oz    Types: 7 Glasses of wine per week    Comment: One glass of red wine in the evenings.   Objective:   BP (!) 146/86 (BP Location: Left Arm, Patient Position: Sitting, Cuff Size: Large)   Pulse 86   Temp 98.7 F (37.1 C) (Oral)   Resp 16   Wt 205 lb (93 kg)   LMP 03/22/2018 (Exact Date)   BMI 36.31 kg/m  Vitals:   04/14/18 1416  BP: (!) 146/86  Pulse: 86  Resp: 16  Temp: 98.7 F (37.1 C)  TempSrc: Oral  Weight: 205 lb (93 kg)     Physical Exam  Constitutional: She is oriented to person, place,  and time. She appears well-developed and well-nourished.  Cardiovascular: Normal rate and regular rhythm.  Pulmonary/Chest: Effort normal and breath sounds normal.  Neurological: She is alert and oriented to person, place, and time.  Skin: Skin is warm and dry.  Psychiatric: She has a normal mood and affect. Her behavior is normal.        Assessment & Plan:     1. Essential (primary) hypertension  Uncontrolled HTN today, though it is improving. EKG reviewed with patient in office, counseled about changes to heart and importance of  controlling high blood pressure. Will increase Lisinopril to 20 mg but she does not want to increase HCTZ because she thinks she may be having cramping due to it. Cmet 1 wk ago normal but for chloride of 99.   - lisinopril-hydrochlorothiazide (ZESTORETIC) 20-12.5 MG tablet; Take 1 tablet by mouth daily.  Dispense: 90 tablet; Refill: 0  Return in about 2 months (around 06/14/2018) for HTN.  The entirety of the information documented in the History of Present Illness, Review of Systems and Physical Exam were personally obtained by me. Portions of this information were initially documented by [place], [place] and reviewed by me for thoroughness and accuracy.            Trinna Post, PA-C  [facility]

## 2018-04-19 ENCOUNTER — Ambulatory Visit
Admission: RE | Admit: 2018-04-19 | Discharge: 2018-04-19 | Disposition: A | Discharge status: Home / Self Care | Payer: BLUE CROSS/BLUE SHIELD | Source: Ambulatory Visit | Attending: Physician Assistant | Admitting: Physician Assistant

## 2018-04-19 DIAGNOSIS — R928 Other abnormal and inconclusive findings on diagnostic imaging of breast: Secondary | ICD-10-CM | POA: Diagnosis not present

## 2018-04-19 DIAGNOSIS — N6489 Other specified disorders of breast: Secondary | ICD-10-CM | POA: Diagnosis not present

## 2018-04-20 ENCOUNTER — Other Ambulatory Visit: Payer: Self-pay | Admitting: Physician Assistant

## 2018-04-20 DIAGNOSIS — R928 Other abnormal and inconclusive findings on diagnostic imaging of breast: Secondary | ICD-10-CM | POA: Diagnosis not present

## 2018-04-20 DIAGNOSIS — N6489 Other specified disorders of breast: Secondary | ICD-10-CM | POA: Diagnosis not present

## 2018-04-26 DIAGNOSIS — H40003 Preglaucoma, unspecified, bilateral: Secondary | ICD-10-CM | POA: Diagnosis not present

## 2018-05-02 ENCOUNTER — Ambulatory Visit
Admission: RE | Admit: 2018-05-02 | Discharge: 2018-05-02 | Disposition: A | Discharge status: Home / Self Care | Payer: BLUE CROSS/BLUE SHIELD | Source: Ambulatory Visit | Attending: Physician Assistant | Admitting: Physician Assistant

## 2018-05-02 DIAGNOSIS — N6489 Other specified disorders of breast: Secondary | ICD-10-CM | POA: Diagnosis not present

## 2018-05-02 DIAGNOSIS — R928 Other abnormal and inconclusive findings on diagnostic imaging of breast: Secondary | ICD-10-CM | POA: Diagnosis not present

## 2018-05-02 DIAGNOSIS — N6031 Fibrosclerosis of right breast: Secondary | ICD-10-CM | POA: Insufficient documentation

## 2018-05-02 HISTORY — PX: BREAST BIOPSY: SHX20

## 2018-05-03 LAB — SURGICAL PATHOLOGY

## 2018-05-16 ENCOUNTER — Telehealth: Payer: Self-pay | Admitting: Diagnostic Radiology

## 2018-05-16 NOTE — Progress Notes (Signed)
canceled

## 2018-05-26 ENCOUNTER — Ambulatory Visit (INDEPENDENT_AMBULATORY_CARE_PROVIDER_SITE_OTHER): Payer: BLUE CROSS/BLUE SHIELD | Admitting: Physician Assistant

## 2018-05-26 ENCOUNTER — Encounter: Payer: Self-pay | Admitting: Physician Assistant

## 2018-05-26 VITALS — BP 136/88 | HR 76 | Temp 98.3°F | Resp 16 | Wt 205.0 lb

## 2018-05-26 DIAGNOSIS — Z720 Tobacco use: Secondary | ICD-10-CM

## 2018-05-26 DIAGNOSIS — I10 Essential (primary) hypertension: Secondary | ICD-10-CM

## 2018-05-26 DIAGNOSIS — Z23 Encounter for immunization: Secondary | ICD-10-CM

## 2018-05-26 MED ORDER — VARENICLINE TARTRATE 0.5 MG X 11 & 1 MG X 42 PO MISC: ORAL | 0 refills | Status: DC

## 2018-05-26 MED ORDER — VARENICLINE TARTRATE 1 MG PO TABS: 1.0000 mg | ORAL_TABLET | Freq: Two times a day (BID) | ORAL | 0 refills | Status: AC

## 2018-05-26 MED ORDER — LOSARTAN POTASSIUM 50 MG PO TABS
50.0000 mg | ORAL_TABLET | Freq: Every day | ORAL | 0 refills | Status: DC
Start: 2018-05-26 — End: 2018-08-25

## 2018-05-26 NOTE — Patient Instructions (Signed)

## 2018-05-26 NOTE — Progress Notes (Signed)
Patient: Stephanie Jacobs Female    DOB: 05/03/1966   52 y.o.   MRN: [id number] Visit Date: 05/26/2018 Today's Provider: Trinna Post, PA-C   Chief Complaint  Patient presents with  . Hypertension    Two month follow up   Subjective:   Continues to have leg cramping. Has dry cough at night. Continues to smoke a pack a day. Did not finish wellbutrin prescription.   Hypertension  This is a chronic problem. The problem has been gradually improving since onset. The problem is controlled. Pertinent negatives include no anxiety, blurred vision, chest pain, headaches, malaise/fatigue, neck pain, orthopnea, palpitations, peripheral edema, PND, shortness of breath or sweats. There are no associated agents to hypertension. Past treatments include diuretics and ACE inhibitors. The current treatment provides moderate improvement. There are no compliance problems.    BP Readings from Last 3 Encounters:  05/26/18 136/88  04/14/18 (!) 146/86  03/10/18 (!) 144/96        Allergies  Allergen Reactions  . Tape      Current Outpatient Medications:  .  albuterol (PROVENTIL HFA;VENTOLIN HFA) 108 (90 BASE) MCG/ACT inhaler, Inhale into the lungs., Disp: , Rfl:  .  amLODipine (NORVASC) 10 MG tablet, Take 1 tablet (10 mg total) by mouth daily., Disp: 90 tablet, Rfl: 3 .  aspirin 81 MG tablet, Take by mouth., Disp: , Rfl:  .  lisinopril-hydrochlorothiazide (ZESTORETIC) 20-12.5 MG tablet, Take 1 tablet by mouth daily., Disp: 90 tablet, Rfl: 0 .  Multiple Vitamin (MULTIVITAMIN) tablet, Take 1 tablet by mouth daily., Disp: , Rfl:  .  buPROPion (WELLBUTRIN SR) 150 MG 12 hr tablet, Take one pill daily for three days. On day 4, take one pill twice daily for remainder of treatment (Patient not taking: Reported on 05/26/2018), Disp: 180 tablet, Rfl: 0  Review of Systems  Constitutional: Negative.  Negative for malaise/fatigue.  Eyes: Negative for blurred vision.  Respiratory: Positive for cough (Dry  cough at night.). Negative for apnea, choking, chest tightness, shortness of breath, wheezing and stridor.   Cardiovascular: Negative for chest pain, palpitations, orthopnea, leg swelling and PND.  Gastrointestinal: Negative.   Musculoskeletal: Positive for myalgias (Pt report having muscle cramping occasionally. ). Negative for arthralgias, back pain, gait problem, joint swelling, neck pain and neck stiffness.  Neurological: Negative for dizziness, light-headedness and headaches.    Social History   Tobacco Use  . Smoking status: Current Every Day Smoker    Packs/day: 0.50    Types: Cigarettes  . Smokeless tobacco: Never Used  Substance Use Topics  . Alcohol use: Yes    Alcohol/week: 4.2 oz    Types: 7 Glasses of wine per week    Comment: One glass of red wine in the evenings.   Objective:   BP 136/88 (BP Location: Left Arm, Patient Position: Sitting, Cuff Size: Large)   Pulse 76   Temp 98.3 F (36.8 C) (Oral)   Resp 16   Wt 205 lb (93 kg)   LMP 05/22/2018   BMI 36.31 kg/m  Vitals:   05/26/18 0849  BP: 136/88  Pulse: 76  Resp: 16  Temp: 98.3 F (36.8 C)  TempSrc: Oral  Weight: 205 lb (93 kg)     Physical Exam  Constitutional: She is oriented to person, place, and time. She appears well-developed and well-nourished.  Cardiovascular: Normal rate and regular rhythm.  Pulmonary/Chest: Effort normal and breath sounds normal.  Neurological: She is alert and oriented to person,  place, and time.  Skin: Skin is warm and dry.  Psychiatric: She has a normal mood and affect. Her behavior is normal.        Assessment & Plan:     1. Essential (primary) hypertension  Will switch to losartan.  - losartan (COZAAR) 50 MG tablet; Take 1 tablet (50 mg total) by mouth daily.  Dispense: 90 tablet; Refill: 0  2. Tobacco abuse  Have counseled patient >3 min on smoking cessation. Will prescribe Chantix.   - varenicline (CHANTIX PAK) 0.5 MG X 11 & 1 MG X 42 tablet; One 0.5 mg  tablet by mouth 1x daily for 3 days, then one 0.5 mg tablet twice daily for 4 days, then increase to one 1 mg tablet 2x daily.  Dispense: 53 tablet; Refill: 0 - varenicline (CHANTIX CONTINUING MONTH PAK) 1 MG tablet; Take 1 tablet (1 mg total) by mouth 2 (two) times daily.  Dispense: 120 tablet; Refill: [place], PA-C  [facility]

## 2018-05-26 NOTE — Addendum Note (Signed)
Addended by: Ashley Royalty E on: 05/26/2018 10:45 AM   Modules accepted: Orders

## 2018-08-25 ENCOUNTER — Ambulatory Visit: Payer: BLUE CROSS/BLUE SHIELD | Admitting: Physician Assistant

## 2018-08-25 ENCOUNTER — Encounter: Payer: Self-pay | Admitting: Physician Assistant

## 2018-08-25 VITALS — BP 140/100 | HR 76 | Temp 98.6°F | Resp 16 | Wt 203.0 lb

## 2018-08-25 DIAGNOSIS — Z720 Tobacco use: Secondary | ICD-10-CM | POA: Diagnosis not present

## 2018-08-25 DIAGNOSIS — I10 Essential (primary) hypertension: Secondary | ICD-10-CM

## 2018-08-25 MED ORDER — VARENICLINE TARTRATE 0.5 MG X 11 & 1 MG X 42 PO MISC: ORAL | 0 refills | Status: DC

## 2018-08-25 MED ORDER — LOSARTAN POTASSIUM 100 MG PO TABS: 100.0000 mg | ORAL_TABLET | Freq: Every day | ORAL | 0 refills | Status: DC

## 2018-08-25 NOTE — Patient Instructions (Signed)

## 2018-08-25 NOTE — Progress Notes (Signed)
Patient: Stephanie Jacobs Female    DOB: [birth year]   52 y.o.   MRN: [id number] Visit Date: 08/25/2018  Today's Provider: Trinna Post, PA-C   Chief Complaint  Patient presents with  . Follow-up    hypertension   Subjective:    HPI  Hypertension, follow-up:  BP Readings from Last 3 Encounters:  08/25/18 (!) 140/100  05/26/18 136/88  04/14/18 (!) 146/86    She was last seen for hypertension 3 months ago.  BP at that visit was 136/88. Management changes since that visit include changing from Lisinopril-HCTZ to Losartan due to muscle cramps.  She reports excellent compliance with treatment. She is not having side effects.  She is not exercising. She is adherent to low salt diet.   Outside blood pressures are not checked. She is experiencing none.  Patient denies chest pain and lower extremity edema.   Cardiovascular risk factors include hypertension and smoking/ tobacco exposure.  Use of agents associated with hypertension: none. Continues to smoke 1/2 pack per day. Did not fill chantix because her insurance did not pay for it. She has failed wellbutrin in the past.   REFUSES flu shot today.  Weight trend: stable Wt Readings from Last 3 Encounters:  08/25/18 203 lb (92.1 kg)  05/26/18 205 lb (93 kg)  04/14/18 205 lb (93 kg)    Current diet: in general, a "healthy" diet    ------------------------------------------------------------------------      Allergies  Allergen Reactions  . Tape      Current Outpatient Medications:  .  albuterol (PROVENTIL HFA;VENTOLIN HFA) 108 (90 BASE) MCG/ACT inhaler, Inhale into the lungs., Disp: , Rfl:  .  aspirin 81 MG tablet, Take by mouth., Disp: , Rfl:  .  Multiple Vitamin (MULTIVITAMIN) tablet, Take 1 tablet by mouth daily., Disp: , Rfl:  .  losartan (COZAAR) 100 MG tablet, Take 1 tablet (100 mg total) by mouth daily., Disp: 90 tablet, Rfl: 0 .  varenicline (CHANTIX PAK) 0.5 MG X 11 & 1 MG X 42 tablet, One 0.5  mg tablet by mouth 1x daily for 3 days, then one 0.5 mg tablet twice daily for 4 days, then increase to one 1 mg tablet 2x daily., Disp: 53 tablet, Rfl: 0  Review of Systems  Constitutional: Negative.   HENT: Negative.   Respiratory: Negative.   Cardiovascular: Negative.     Social History   Tobacco Use  . Smoking status: Current Every Day Smoker    Packs/day: 0.50    Types: Cigarettes  . Smokeless tobacco: Never Used  Substance Use Topics  . Alcohol use: Yes    Alcohol/week: 7.0 standard drinks    Types: 7 Glasses of wine per week    Comment: One glass of red wine in the evenings.   Objective:   BP (!) 140/100 (BP Location: Left Arm, Patient Position: Sitting, Cuff Size: Large)   Pulse 76   Temp 98.6 F (37 C) (Oral)   Resp 16   Wt 203 lb (92.1 kg)   SpO2 97%   BMI 35.96 kg/m  Vitals:   08/25/18 0848  BP: (!) 140/100  Pulse: 76  Resp: 16  Temp: 98.6 F (37 C)  TempSrc: Oral  SpO2: 97%  Weight: 203 lb (92.1 kg)     Physical Exam  Constitutional: She is oriented to person, place, and time. She appears well-developed and well-nourished.  Cardiovascular: Normal rate and regular rhythm.  Pulmonary/Chest: Effort normal and breath sounds  normal.  Neurological: She is alert and oriented to person, place, and time.  Skin: Skin is warm and dry.  Psychiatric: She has a normal mood and affect. Her behavior is normal.        Assessment & Plan:     1. Essential (primary) hypertension  Increase from 50 mg to 100 mg as below. Follow up 3 months with labwork at that time. Has lost some weight by modifying her diet.   - losartan (COZAAR) 100 MG tablet; Take 1 tablet (100 mg total) by mouth daily.  Dispense: 90 tablet; Refill: 0  2. Tobacco abuse  I have counseled pt 3-10 minutes about tobacco cessation and methods to do so. Agreeable to chantix, this will likely require a PA which pharmacy should send to [place]. Have emphasized the importance of smoking cessation in  controlling her blood pressure.   - varenicline (CHANTIX PAK) 0.5 MG X 11 & 1 MG X 42 tablet; One 0.5 mg tablet by mouth 1x daily for 3 days, then one 0.5 mg tablet twice daily for 4 days, then increase to one 1 mg tablet 2x daily.  Dispense: 53 tablet; Refill: 0  Return in about 3 months (around 11/24/2018) for HTn.  The entirety of the information documented in the History of Present Illness, Review of Systems and Physical Exam were personally obtained by me. Portions of this information were initially documented by Lynford Humphrey, CMA and reviewed by me for thoroughness and accuracy.         Trinna Post, PA-C  [facility]

## 2018-08-30 ENCOUNTER — Telehealth: Payer: Self-pay

## 2018-08-30 MED ORDER — AMLODIPINE BESYLATE 5 MG PO TABS: 5.0000 mg | ORAL_TABLET | Freq: Every day | ORAL | 1 refills | Status: DC

## 2018-08-30 NOTE — Telephone Encounter (Signed)
Add amlodipine 5mg  once a day #30,rf x 1. Schedule follow up Adriana 4 weeks.

## 2018-08-30 NOTE — Telephone Encounter (Signed)
  Pt called stating she has seen Adriana 08/25/2018 for a blood pressure follow up.  Pt's losartan was increased to 100mg  a day.  She reports her blood pressures have been running high:    182/124 08/30/2018 180/120 08/29/2018 203/124 08/28/2018 184/97  08/27/2018  She denies chest pain, shortness of breath, confusion, dizziness.  She does complain of a "Slight" headache today pt says it was about a 4/10.  She took some Asprin and her headache is completely gone now.    Pharmacy [place] Contact Number: [phone]  Thanks,   -Mickel Baas

## 2018-08-30 NOTE — Telephone Encounter (Signed)
Pt advised.  RX sent to [facility] [address] scheduled for 10/13/2018  Thanks,   -Mickel Baas

## 2018-09-04 ENCOUNTER — Emergency Department
Admission: EM | Admit: 2018-09-04 | Discharge: 2018-09-04 | Disposition: A | Discharge status: Home / Self Care | Payer: BLUE CROSS/BLUE SHIELD | Attending: Emergency Medicine | Admitting: Emergency Medicine

## 2018-09-04 ENCOUNTER — Emergency Department: Payer: BLUE CROSS/BLUE SHIELD

## 2018-09-04 ENCOUNTER — Encounter: Payer: Self-pay | Admitting: Emergency Medicine

## 2018-09-04 DIAGNOSIS — I10 Essential (primary) hypertension: Secondary | ICD-10-CM | POA: Insufficient documentation

## 2018-09-04 DIAGNOSIS — R079 Chest pain, unspecified: Secondary | ICD-10-CM | POA: Diagnosis not present

## 2018-09-04 DIAGNOSIS — R0789 Other chest pain: Secondary | ICD-10-CM | POA: Diagnosis not present

## 2018-09-04 DIAGNOSIS — Z79899 Other long term (current) drug therapy: Secondary | ICD-10-CM | POA: Insufficient documentation

## 2018-09-04 DIAGNOSIS — F17210 Nicotine dependence, cigarettes, uncomplicated: Secondary | ICD-10-CM | POA: Insufficient documentation

## 2018-09-04 LAB — BASIC METABOLIC PANEL
Anion gap: 12 (ref 5–15)
BUN: 8 mg/dL (ref 6–20)
CALCIUM: 9.2 mg/dL (ref 8.9–10.3)
CO2: 26 mmol/L (ref 22–32)
Chloride: 97 mmol/L — ABNORMAL LOW (ref 98–111)
Creatinine, Ser: 0.69 mg/dL (ref 0.44–1.00)
Glucose, Bld: 106 mg/dL — ABNORMAL HIGH (ref 70–99)
POTASSIUM: 4 mmol/L (ref 3.5–5.1)
SODIUM: 135 mmol/L (ref 135–145)

## 2018-09-04 LAB — CBC
HEMATOCRIT: 45.1 % (ref 35.0–47.0)
HEMOGLOBIN: 15.7 g/dL (ref 12.0–16.0)
MCH: 31.6 pg (ref 26.0–34.0)
MCHC: 34.8 g/dL (ref 32.0–36.0)
MCV: 90.9 fL (ref 80.0–100.0)
Platelets: 318 10*3/uL (ref 150–440)
RBC: 4.96 MIL/uL (ref 3.80–5.20)
RDW: 13.3 % (ref 11.5–14.5)
WBC: 11.5 10*3/uL — AB (ref 3.6–11.0)

## 2018-09-04 LAB — TROPONIN I

## 2018-09-04 NOTE — Discharge Instructions (Addendum)
Please seek medical attention for any high fevers, chest pain, shortness of breath, change in behavior, persistent vomiting, bloody stool or any other new or concerning symptoms.  

## 2018-09-04 NOTE — ED Triage Notes (Signed)
Patient presents to the ED with mild persistent left sided chest pressure that began around 9am.  Patient reports taking aspirin pta.  Patient states her doctor switched her medications from lisinopril/hctz to losartan due to cough.  Patient's blood pressure was very high over the weekend.

## 2018-09-04 NOTE — ED Provider Notes (Signed)
[facility] Emergency Department Provider Note    ____________________________________________   I have reviewed the triage vital signs and the nursing notes.   HISTORY  Chief Complaint Chest Pain   History limited by: Not Limited   HPI Stephanie Jacobs is a 52 y.o. female who presents to the emergency department today because of concerns for chest pain.  It started this morning.  Located on the left side.  She describes it as a mild pressure.  It did not radiate.  It was not accompanied by shortness of breath or diaphoresis.  The patient states that the time of my examination it has improved.  She denies any unusual activity.  She does say that she has been working with her primary care doctor to get her blood pressure under control.  Primary care doctor has added on a new medication.   Per medical record review patient has a history of HTN  Past Medical History:  Diagnosis Date  . Hypertension   . Sickle cell trait [facility])     Patient Active Problem List   Diagnosis Date Noted  . Anxiety 05/07/2015  . Abnormal LFTs 05/07/2015  . Essential (primary) hypertension 05/07/2015  . Blood glucose elevated 05/07/2015  . Counseling on substance use and abuse 05/07/2015    Past Surgical History:  Procedure Laterality Date  . BREAST BIOPSY Right 05/02/2018   Affirm Bx- path pending - coil clip  . TUBAL LIGATION  1995    Prior to Admission medications   Medication Sig Start Date End Date Taking? Authorizing Provider  albuterol (PROVENTIL HFA;VENTOLIN HFA) 108 (90 BASE) MCG/ACT inhaler Inhale into the lungs. 11/18/14   [provider]  amLODipine (NORVASC) 5 MG tablet Take 1 tablet (5 mg total) by mouth daily. 08/30/18   Birdie Sons, MD  aspirin 81 MG tablet Take by mouth.    [provider]  losartan (COZAAR) 100 MG tablet Take 1 tablet (100 mg total) by mouth daily. 08/25/18   Trinna Post, PA-C  Multiple Vitamin (MULTIVITAMIN)  tablet Take 1 tablet by mouth daily.    [provider]  varenicline (CHANTIX PAK) 0.5 MG X 11 & 1 MG X 42 tablet One 0.5 mg tablet by mouth 1x daily for 3 days, then one 0.5 mg tablet twice daily for 4 days, then increase to one 1 mg tablet 2x daily. 08/25/18   Trinna Post, PA-C    Allergies Tape  Family History  Problem Relation Age of Onset  . Hypertension Mother   . Diabetes Mother   . Sickle cell anemia Brother   . Sickle cell trait Brother   . Breast cancer Neg Hx     Social History Social History   Tobacco Use  . Smoking status: Current Every Day Smoker    Packs/day: 0.50    Types: Cigarettes  . Smokeless tobacco: Never Used  Substance Use Topics  . Alcohol use: Yes    Alcohol/week: 7.0 standard drinks    Types: 7 Glasses of wine per week    Comment: One glass of red wine in the evenings.  . Drug use: No    Review of Systems Constitutional: No fever/chills Eyes: No visual changes. ENT: No sore throat. Cardiovascular: Positive for chest pain. Respiratory: Denies shortness of breath. Gastrointestinal: No abdominal pain.  No nausea, no vomiting.  No diarrhea.   Genitourinary: Negative for dysuria. Musculoskeletal: Negative for back pain. Skin: Negative for rash. Neurological: Negative for headaches, focal weakness  or numbness.  ____________________________________________   PHYSICAL EXAM:  VITAL SIGNS: ED Triage Vitals  Enc Vitals Group     BP 09/04/18 1020 (!) 164/97     Pulse Rate 09/04/18 1020 (!) 101     Resp 09/04/18 1020 18     Temp 09/04/18 1020 98.7 F (37.1 C)     Temp Source 09/04/18 1020 Oral     SpO2 09/04/18 1020 96 %     Weight 09/04/18 1022 203 lb (92.1 kg)     Height 09/04/18 1022 5\' 3"  (1.6 m)     Head Circumference --      Peak Flow --      Pain Score 09/04/18 1022 3   Constitutional: Alert and oriented.  Eyes: Conjunctivae are normal.  ENT      Head: Normocephalic and atraumatic.      Nose: No  congestion/rhinnorhea.      Mouth/Throat: Mucous membranes are moist.      Neck: No stridor. Hematological/Lymphatic/Immunilogical: No cervical lymphadenopathy. Cardiovascular: Normal rate, regular rhythm.  No murmurs, rubs, or gallops.  Respiratory: Normal respiratory effort without tachypnea nor retractions. Breath sounds are clear and equal bilaterally. No wheezes/rales/rhonchi. Gastrointestinal: Soft and non tender. No rebound. No guarding.  Genitourinary: Deferred Musculoskeletal: Normal range of motion in all extremities. No lower extremity edema. Neurologic:  Normal speech and language. No gross focal neurologic deficits are appreciated.  Skin:  Skin is warm, dry and intact. No rash noted. Psychiatric: Mood and affect are normal. Speech and behavior are normal. Patient exhibits appropriate insight and judgment.  ____________________________________________    LABS (pertinent positives/negatives)  Trop <0.03 CBC wbc 11.5, hgb 15.7, plt 318 BMP wnl except cl 97, glu 106  ____________________________________________   EKG  I, Nance Pear, attending physician, personally viewed and interpreted this EKG  EKG Time: 1018 Rate: 99 Rhythm: normal sinus rhythm Axis: left axis deviation Intervals: qtc 456 QRS: LVH, narrow ST changes: no st elevation Impression: normal ekg   ____________________________________________    RADIOLOGY  CXR No acute disease  ____________________________________________   PROCEDURES  Procedures  ____________________________________________   INITIAL IMPRESSION / ASSESSMENT AND PLAN / ED COURSE  Pertinent labs & imaging results that were available during my care of the patient were reviewed by me and considered in my medical decision making (see chart for details).   Patient presents to the emergency department today because of concerns for chest pain.  The time my exam the patient felt better and physical exam was benign.   Differential would include pneumonia, pneumothorax, ACS, PE, dissection, esophagitis, costochondritis amongst other etiologies.  Patient's troponin was negative.  Patient is a low risk heart score.  At this point I doubt PE given lack of respiratory symptoms or leg swelling.  Doubt dissection given improvement.  Did discuss with patient results of testing.  Discussed importance of primary care follow-up. __________________________________________   FINAL CLINICAL IMPRESSION(S) / ED DIAGNOSES  Final diagnoses:  Nonspecific chest pain  Hypertension, unspecified type     Note: This dictation was prepared with Dragon dictation. Any transcriptional errors that result from this process are unintentional     Nance Pear, MD 09/04/18 1431

## 2018-09-10 ENCOUNTER — Other Ambulatory Visit: Payer: Self-pay | Admitting: Physician Assistant

## 2018-09-10 DIAGNOSIS — I10 Essential (primary) hypertension: Secondary | ICD-10-CM

## 2018-09-11 NOTE — Telephone Encounter (Signed)
[facility] day supply was [facility]

## 2018-10-13 ENCOUNTER — Encounter: Payer: Self-pay | Admitting: Physician Assistant

## 2018-10-13 ENCOUNTER — Ambulatory Visit (INDEPENDENT_AMBULATORY_CARE_PROVIDER_SITE_OTHER): Payer: BLUE CROSS/BLUE SHIELD | Admitting: Physician Assistant

## 2018-10-13 ENCOUNTER — Other Ambulatory Visit: Payer: Self-pay

## 2018-10-13 VITALS — BP 169/104 | HR 74 | Temp 98.0°F | Ht 63.0 in | Wt 199.6 lb

## 2018-10-13 DIAGNOSIS — I10 Essential (primary) hypertension: Secondary | ICD-10-CM | POA: Diagnosis not present

## 2018-10-13 MED ORDER — AMLODIPINE BESYLATE 10 MG PO TABS: 10.0000 mg | ORAL_TABLET | Freq: Every day | ORAL | 0 refills | Status: DC

## 2018-10-13 NOTE — Progress Notes (Signed)
Patient: Stephanie Jacobs Female    DOB: January 17, 1966   52 y.o.   MRN: [id number] Visit Date: 10/13/2018  Today's Provider: Trinna Post, PA-C   Chief Complaint  Patient presents with  . Follow-up    elevasted blood pressure  . Gastroesophageal Reflux   Subjective:       Hypertension, follow-up:  BP Readings from Last 3 Encounters:  10/13/18 (!) 169/104  09/04/18 (!) 142/98  08/25/18 (!) 140/100    She was last seen for hypertension 3 months ago. At that time, she was on 10 mg amlodipine QD and losartan 50 mg QD. Her Losartan was increased to 100 mg QD and she was instructed to follow up in one month. In the interim, she had been checking her blood pressure one week after her increase in losartan and reported her blood pressure had not come down. She works at [employer] and reports a Designer, jewellery there told her that Lisinopril was the best medication for African Americans with HTN and that she should go back to taking her Lisinopril-HCTZ 20-12.5 mg daily. So the patient reports she stops BOTH of her amlodipine and her losartatn and is currently taking the Lisinopril-HCTZ 20-12.5mg  which had previously been discontinued. Then, the nurse practitioner sent her to the emergency room because she was having chest pain and her BP was high. Her workup was negative but for some LVH on EKG which is likely due to her HTN. She also reports using Robitussin D in the past few days.   She continues to smoke. She is currently taking Chantix and tolerating it well but reports not quitting on her target quit date. Reports next target quit date is 10/21/2018.    Weight trend: decreasing steadily Wt Readings from Last 3 Encounters:  10/13/18 199 lb 9.6 oz (90.5 kg)  09/04/18 203 lb (92.1 kg)  08/25/18 203 lb (92.1 kg)   ------------------------------------------------------------------------      Allergies  Allergen Reactions  . Tape      Current Outpatient  Medications:  .  albuterol (PROVENTIL HFA;VENTOLIN HFA) 108 (90 BASE) MCG/ACT inhaler, Inhale into the lungs., Disp: , Rfl:  .  aspirin 81 MG tablet, Take by mouth., Disp: , Rfl:  .  lisinopril-hydrochlorothiazide (PRINZIDE,ZESTORETIC) 20-12.5 MG tablet, Take 1 tablet by mouth daily., Disp: , Rfl:  .  Multiple Vitamin (MULTIVITAMIN) tablet, Take 1 tablet by mouth daily., Disp: , Rfl:  .  varenicline (CHANTIX PAK) 0.5 MG X 11 & 1 MG X 42 tablet, One 0.5 mg tablet by mouth 1x daily for 3 days, then one 0.5 mg tablet twice daily for 4 days, then increase to one 1 mg tablet 2x daily., Disp: 53 tablet, Rfl: 0 .  amLODipine (NORVASC) 5 MG tablet, Take 1 tablet (5 mg total) by mouth daily. (Patient not taking: Reported on 10/13/2018), Disp: 30 tablet, Rfl: 1 .  losartan (COZAAR) 100 MG tablet, Take 1 tablet (100 mg total) by mouth daily. (Patient not taking: Reported on 10/13/2018), Disp: 90 tablet, Rfl: 3  Review of Systems   ROS negative except for pertinent in HPI.   Social History   Tobacco Use  . Smoking status: Current Every Day Smoker    Packs/day: 0.50    Types: Cigarettes  . Smokeless tobacco: Never Used  Substance Use Topics  . Alcohol use: Yes    Alcohol/week: 7.0 standard drinks    Types: 7 Glasses of wine per week    Comment:  One glass of red wine in the evenings.   Objective:   BP (!) 169/104 (BP Location: Right Arm, Patient Position: Sitting, Cuff Size: Large)   Pulse 74   Temp 98 F (36.7 C)   Ht 5\' 3"  (1.6 m)   Wt 199 lb 9.6 oz (90.5 kg)   SpO2 98%   BMI 35.36 kg/m  Vitals:   10/13/18 1403  BP: (!) 169/104  Pulse: 74  Temp: 98 F (36.7 C)  SpO2: 98%  Weight: 199 lb 9.6 oz (90.5 kg)  Height: 5\' 3"  (1.6 m)     Physical Exam  Constitutional: She is oriented to person, place, and time. She appears well-developed and well-nourished.  Cardiovascular: Normal rate and regular rhythm.  Pulmonary/Chest: Effort normal and breath sounds normal.  Neurological: She is  alert and oriented to person, place, and time.  Skin: Skin is warm and dry.  Psychiatric: She has a normal mood and affect. Her behavior is normal.        Assessment & Plan:     1. Essential (primary) hypertension  Her HTN is much worse than last time. Counseled that one week was likely not enough time to allow losartan to work. Additionally, unsure why she discontinued BOTH of her hypertensive medications to switch to Lisinopril-HCTZ but it is clearly not effective for her. Will have her do 5 mg amlodipine daily for one week and then 10 mg daily onward. Will have her do 50 mg losartan daily for one week in addition to amlodipine and then 100 mg losartan daily onward. Her final regimen should include amlodipine 10 mg daily and losartan 100 mg daily. She should be cautious about taking medical advice from people who are not actively involved in her treatment plan. See her in one month.   - amLODipine (NORVASC) 10 MG tablet; Take 1 tablet (10 mg total) by mouth daily.  Dispense: 90 tablet; Refill: 0  The entirety of the information documented in the History of Present Illness, Review of Systems and Physical Exam were personally obtained by me. Portions of this information were initially documented by Lynford Humphrey, CMA and reviewed by me for thoroughness and accuracy.        Trinna Post, PA-C  [facility]

## 2018-10-13 NOTE — Patient Instructions (Signed)
Take 5 mg amlodipine daily for one week, then 10 mg amlodipine daily onward Take 50 mg Losartan daily for one week, then 100 mg losartan daily onward   Hypertension Hypertension is another name for high blood pressure. High blood pressure forces your heart to work harder to pump blood. This can cause problems over time. There are two numbers in a blood pressure reading. There is a top number (systolic) over a bottom number (diastolic). It is best to have a blood pressure below 120/80. Healthy choices can help lower your blood pressure. You may need medicine to help lower your blood pressure if:  Your blood pressure cannot be lowered with healthy choices.  Your blood pressure is higher than 130/80.  Follow these instructions at home: Eating and drinking  If directed, follow the DASH eating plan. This diet includes: ? Filling half of your plate at each meal with fruits and vegetables. ? Filling one quarter of your plate at each meal with whole grains. Whole grains include whole wheat pasta, brown rice, and whole grain bread. ? Eating or drinking low-fat dairy products, such as skim milk or low-fat yogurt. ? Filling one quarter of your plate at each meal with low-fat (lean) proteins. Low-fat proteins include fish, skinless chicken, eggs, beans, and tofu. ? Avoiding fatty meat, cured and processed meat, or chicken with skin. ? Avoiding premade or processed food.  Eat less than 1,500 mg of salt (sodium) a day.  Limit alcohol use to no more than 1 drink a day for nonpregnant women and 2 drinks a day for men. One drink equals 12 oz of beer, 5 oz of wine, or 1 oz of hard liquor. Lifestyle  Work with your doctor to stay at a healthy weight or to lose weight. Ask your doctor what the best weight is for you.  Get at least 30 minutes of exercise that causes your heart to beat faster (aerobic exercise) most days of the week. This may include walking, swimming, or biking.  Get at least 30 minutes  of exercise that strengthens your muscles (resistance exercise) at least 3 days a week. This may include lifting weights or pilates.  Do not use any products that contain nicotine or tobacco. This includes cigarettes and e-cigarettes. If you need help quitting, ask your doctor.  Check your blood pressure at home as told by your doctor.  Keep all follow-up visits as told by your doctor. This is important. Medicines  Take over-the-counter and prescription medicines only as told by your doctor. Follow directions carefully.  Do not skip doses of blood pressure medicine. The medicine does not work as well if you skip doses. Skipping doses also puts you at risk for problems.  Ask your doctor about side effects or reactions to medicines that you should watch for. Contact a doctor if:  You think you are having a reaction to the medicine you are taking.  You have headaches that keep coming back (recurring).  You feel dizzy.  You have swelling in your ankles.  You have trouble with your vision. Get help right away if:  You get a very bad headache.  You start to feel confused.  You feel weak or numb.  You feel faint.  You get very bad pain in your: ? Chest. ? Belly (abdomen).  You throw up (vomit) more than once.  You have trouble breathing. Summary  Hypertension is another name for high blood pressure.  Making healthy choices can help lower blood pressure. If  your blood pressure cannot be controlled with healthy choices, you may need to take medicine. This information is not intended to replace advice given to you by your health care provider. Make sure you discuss any questions you have with your health care provider. Document Released: 05/24/2008 Document Revised: 11/03/2016 Document Reviewed: 11/03/2016 Elsevier Interactive Patient Education  [employer].

## 2018-11-13 ENCOUNTER — Ambulatory Visit (INDEPENDENT_AMBULATORY_CARE_PROVIDER_SITE_OTHER): Payer: BLUE CROSS/BLUE SHIELD | Admitting: Physician Assistant

## 2018-11-13 ENCOUNTER — Encounter: Payer: Self-pay | Admitting: Physician Assistant

## 2018-11-13 VITALS — BP 151/85 | HR 80 | Temp 98.4°F | Resp 16 | Ht 63.0 in | Wt 198.8 lb

## 2018-11-13 DIAGNOSIS — I10 Essential (primary) hypertension: Secondary | ICD-10-CM | POA: Diagnosis not present

## 2018-11-13 DIAGNOSIS — R29818 Other symptoms and signs involving the nervous system: Secondary | ICD-10-CM | POA: Diagnosis not present

## 2018-11-13 MED ORDER — LOSARTAN POTASSIUM-HCTZ 100-25 MG PO TABS: 1.0000 | ORAL_TABLET | Freq: Every day | ORAL | 0 refills | Status: DC

## 2018-11-13 NOTE — Progress Notes (Signed)
Established Patient Office Visit  Subjective:  Patient ID: Stephanie Jacobs, female    DOB: [birth year]  Age: 51 y.o. MRN: [id number]  CC:  Chief Complaint  Patient presents with  . Follow-up    HPI Stephanie Jacobs presents for   Hypertension, follow-up:  BP Readings from Last 3 Encounters:  11/13/18 (!) 151/85  10/13/18 (!) 169/104  09/04/18 (!) 142/98    She was last seen for hypertension 2 weeks ago.  BP at that visit was medication 169/104. Management changes since that visit include medication change. She is currently taking 10 mg amldoipine and 100 mg losartan daily.  She reports excellent compliance with treatment. She is not having side effects.  She is not exercising. She is adherent to low salt diet.   Outside blood pressures are elevated. She is experiencing none.  Patient denies chest pain and lower extremity edema.   Cardiovascular risk factors include hypertension, obesity (BMI >= 30 kg/m2) and smoking/ tobacco exposure.  Use of agents associated with hypertension: none.  Reports home readings are 140's/90s.  Weight trend: stable Wt Readings from Last 3 Encounters:  11/13/18 198 lb 12.8 oz (90.2 kg)  10/13/18 199 lb 9.6 oz (90.5 kg)  09/04/18 203 lb (92.1 kg)    Current diet: in general, a "healthy" diet    Suspected Sleep Apnea: Reports her daughter tells he she snores. She also has significant daytime fatigue and can doze off at any minute.   Tobacco Abuse: She continues to take chantix.  ------------------------------------------------------------------------    Past Medical History:  Diagnosis Date  . Hypertension   . Sickle cell trait [facility])     Past Surgical History:  Procedure Laterality Date  . BREAST BIOPSY Right 05/02/2018   Affirm Bx- path pending - coil clip  . TUBAL LIGATION  1995    Family History  Problem Relation Age of Onset  . Hypertension Mother   . Diabetes Mother   . Sickle cell anemia Brother   . Sickle cell  trait Brother   . Breast cancer Neg Hx     Social History   Socioeconomic History  . Marital status: Single    Spouse name: Not on file  . Number of children: Not on file  . Years of education: Not on file  . Highest education level: Not on file  Occupational History  . Not on file  Social Needs  . Financial resource strain: Not on file  . Food insecurity:    Worry: Not on file    Inability: Not on file  . Transportation needs:    Medical: Not on file    Non-medical: Not on file  Tobacco Use  . Smoking status: Current Every Day Smoker    Packs/day: 0.50    Types: Cigarettes  . Smokeless tobacco: Never Used  Substance and Sexual Activity  . Alcohol use: Yes    Alcohol/week: 7.0 standard drinks    Types: 7 Glasses of wine per week    Comment: One glass of red wine in the evenings.  . Drug use: No  . Sexual activity: Never  Lifestyle  . Physical activity:    Days per week: Not on file    Minutes per session: Not on file  . Stress: Not on file  Relationships  . Social connections:    Talks on phone: Not on file    Gets together: Not on file    Attends religious service: Not on file    Active  member of club or organization: Not on file    Attends meetings of clubs or organizations: Not on file    Relationship status: Not on file  . Intimate partner violence:    Fear of current or ex partner: Not on file    Emotionally abused: Not on file    Physically abused: Not on file    Forced sexual activity: Not on file  Other Topics Concern  . Not on file  Social History Narrative  . Not on file    Outpatient Medications Prior to Visit  Medication Sig Dispense Refill  . albuterol (PROVENTIL HFA;VENTOLIN HFA) 108 (90 BASE) MCG/ACT inhaler Inhale into the lungs.    [name] amLODipine (NORVASC) 10 MG tablet Take 1 tablet (10 mg total) by mouth daily. 90 tablet 0  . aspirin 81 MG tablet Take by mouth.    . losartan (COZAAR) 100 MG tablet Take 100 mg by mouth daily.  0  .  Multiple Vitamin (MULTIVITAMIN) tablet Take 1 tablet by mouth daily.    . varenicline (CHANTIX PAK) 0.5 MG X 11 & 1 MG X 42 tablet One 0.5 mg tablet by mouth 1x daily for 3 days, then one 0.5 mg tablet twice daily for 4 days, then increase to one 1 mg tablet 2x daily. 53 tablet 0  . lisinopril-hydrochlorothiazide (PRINZIDE,ZESTORETIC) 20-12.5 MG tablet Take 1 tablet by mouth daily.     No facility-administered medications prior to visit.     Allergies  Allergen Reactions  . Tape     ROS Review of Systems  Constitutional: Negative.   Respiratory: Negative.   Cardiovascular: Negative.       Objective:    Physical Exam  Constitutional: She is oriented to person, place, and time. She appears well-developed and well-nourished.  Cardiovascular: Normal rate and regular rhythm.  Pulmonary/Chest: Effort normal and breath sounds normal.  Neurological: She is alert and oriented to person, place, and time.  Skin: Skin is warm and dry.  Psychiatric: She has a normal mood and affect. Her behavior is normal.    BP (!) 151/85   Pulse 80   Temp 98.4 F (36.9 C) (Oral)   Resp 16   Ht 5\' 3"  (1.6 m)   Wt 198 lb 12.8 oz (90.2 kg)   SpO2 99%   BMI 35.22 kg/m  Wt Readings from Last 3 Encounters:  11/13/18 198 lb 12.8 oz (90.2 kg)  10/13/18 199 lb 9.6 oz (90.5 kg)  09/04/18 203 lb (92.1 kg)   Results of the Epworth flowsheet 11/13/2018  Sitting and reading 3  Watching TV 3  Sitting, inactive in a public place (e.g. a theatre or a meeting) 3  As a passenger in a car for an hour without a break 0  Lying down to rest in the afternoon when circumstances permit 3  Sitting and talking to someone 0  Sitting quietly after a lunch without alcohol 0  In a car, while stopped for a few minutes in traffic 0  Total score 12     Health Maintenance Due  Topic Date Due  . HIV Screening  12/07/1983    There are no preventive care reminders to display for this patient.  Lab Results  Component  Value Date   TSH 1.620 01/19/2018   Lab Results  Component Value Date   WBC 11.5 (H) 09/04/2018   HGB 15.7 09/04/2018   HCT 45.1 09/04/2018   MCV 90.9 09/04/2018   PLT 318 09/04/2018   Lab  Results  Component Value Date   NA 135 09/04/2018   K 4.0 09/04/2018   CO2 26 09/04/2018   GLUCOSE 106 (H) 09/04/2018   BUN 8 09/04/2018   CREATININE 0.69 09/04/2018   BILITOT 0.7 01/19/2018   ALKPHOS 61 01/19/2018   AST 37 01/19/2018   ALT 38 (H) 01/19/2018   PROT 7.7 01/19/2018   ALBUMIN 4.8 01/19/2018   CALCIUM 9.2 09/04/2018   ANIONGAP 12 09/04/2018   Lab Results  Component Value Date   CHOL 182 01/19/2018   Lab Results  Component Value Date   HDL 65 01/19/2018   Lab Results  Component Value Date   LDLCALC 101 (H) 01/19/2018   Lab Results  Component Value Date   TRIG 82 01/19/2018   Lab Results  Component Value Date   CHOLHDL 2.8 01/19/2018   Lab Results  Component Value Date   HGBA1C 5.3 01/19/2018      Assessment & Plan:  1. Essential (primary) hypertension  There has been a lot of back and forth with her medications. Prior to recent change in medications, she was historically on Lisinoprl 10-12.5 mg daily and amlodipine 10 mg daily and her BP was the lowest it has been to date. She reported she had leg cramps and thus the HCTZ was stopped, though there was no documented electrolyte imbalance during that time. Suggest adding HCTZ 25 mg to her Losartan 100 mg daily, in addition to amlodipine 10 mg daily. Will also have her do sleep study as I suspect she has sleep apnea which may be impeding control of BP.  - losartan-hydrochlorothiazide (HYZAAR) 100-25 MG tablet; Take 1 tablet by mouth daily.  Dispense: 90 tablet; Refill: 0  2. Suspected sleep apnea  - Split night study; Future   Follow-up: Return in about 6 weeks (around 12/25/2018).   The entirety of the information documented in the History of Present Illness, Review of Systems and Physical Exam were  personally obtained by me. Portions of this information were initially documented by Lynford Humphrey, CMA and reviewed by me for thoroughness and accuracy.   Trinna Post, PA-C

## 2018-11-13 NOTE — Patient Instructions (Signed)

## 2018-11-28 ENCOUNTER — Telehealth: Payer: Self-pay | Admitting: Physician Assistant

## 2018-11-28 NOTE — Telephone Encounter (Signed)
Order for split night sleep study faxed to [facility] Phone [phone]

## 2018-11-29 ENCOUNTER — Telehealth: Payer: Self-pay | Admitting: Physician Assistant

## 2018-11-29 DIAGNOSIS — I10 Essential (primary) hypertension: Secondary | ICD-10-CM

## 2018-11-29 MED ORDER — HYDROCHLOROTHIAZIDE 25 MG PO TABS: 25.0000 mg | ORAL_TABLET | Freq: Every day | ORAL | 0 refills | Status: DC

## 2018-11-29 MED ORDER — LOSARTAN POTASSIUM 100 MG PO TABS: 100.0000 mg | ORAL_TABLET | Freq: Every day | ORAL | 0 refills | Status: DC

## 2018-11-29 NOTE — Telephone Encounter (Signed)
Can you call and let patient know I sent in Rx separately due to back order?

## 2018-11-29 NOTE — Telephone Encounter (Signed)
[facility] sent a fax stating that losartan-hydrochlorothiazide (HYZAAR) 100-25 MG tablet is on back order.  If appropriate, please sent prescription as two separate Rx of losartan 100mg  tablet daily and HCTZ 25mg  tablet daily.  Please advise

## 2018-11-30 NOTE — Telephone Encounter (Signed)
Patient was advised.  

## 2018-12-05 ENCOUNTER — Other Ambulatory Visit: Payer: Self-pay | Admitting: Physician Assistant

## 2018-12-05 DIAGNOSIS — I10 Essential (primary) hypertension: Secondary | ICD-10-CM

## 2018-12-08 ENCOUNTER — Ambulatory Visit: Payer: Self-pay | Admitting: Physician Assistant

## 2018-12-16 ENCOUNTER — Other Ambulatory Visit: Payer: Self-pay | Admitting: Physician Assistant

## 2018-12-16 DIAGNOSIS — I10 Essential (primary) hypertension: Secondary | ICD-10-CM

## 2018-12-18 MED ORDER — HYDROCHLOROTHIAZIDE 25 MG PO TABS: 25.0000 mg | ORAL_TABLET | Freq: Every day | ORAL | 0 refills | Status: DC

## 2018-12-18 NOTE — Telephone Encounter (Signed)
PCP will be in this afternoon and can review this request

## 2019-01-15 ENCOUNTER — Ambulatory Visit: Payer: Self-pay | Admitting: Physician Assistant

## 2019-01-26 ENCOUNTER — Ambulatory Visit (INDEPENDENT_AMBULATORY_CARE_PROVIDER_SITE_OTHER): Payer: BLUE CROSS/BLUE SHIELD | Admitting: Physician Assistant

## 2019-01-26 ENCOUNTER — Encounter: Payer: Self-pay | Admitting: Physician Assistant

## 2019-01-26 VITALS — BP 142/82 | HR 72 | Temp 98.7°F | Resp 16 | Ht 63.0 in | Wt 199.0 lb

## 2019-01-26 DIAGNOSIS — Z1211 Encounter for screening for malignant neoplasm of colon: Secondary | ICD-10-CM | POA: Diagnosis not present

## 2019-01-26 DIAGNOSIS — M170 Bilateral primary osteoarthritis of knee: Secondary | ICD-10-CM

## 2019-01-26 DIAGNOSIS — R29818 Other symptoms and signs involving the nervous system: Secondary | ICD-10-CM | POA: Diagnosis not present

## 2019-01-26 DIAGNOSIS — B351 Tinea unguium: Secondary | ICD-10-CM

## 2019-01-26 DIAGNOSIS — I10 Essential (primary) hypertension: Secondary | ICD-10-CM | POA: Diagnosis not present

## 2019-01-26 MED ORDER — DICLOFENAC SODIUM 1 % TD GEL: 4.0000 g | Freq: Four times a day (QID) | TRANSDERMAL | 0 refills | Status: DC

## 2019-01-26 NOTE — Progress Notes (Signed)
Patient: Stephanie Jacobs Female    DOB: 05-May-1966   53 y.o.   MRN: [id number] Visit Date: 02/01/2019  Today's Provider: Trinna Post, PA-C   Chief Complaint  Patient presents with  . Hypertension   Subjective:     HPI  Hypertension, follow-up:  BP Readings from Last 3 Encounters:  01/26/19 (!) 142/82  11/13/18 (!) 151/85  10/13/18 (!) 169/104    She was last seen for hypertension 2 months ago.  BP at that visit was 151/85. Management since that visit includes starting Losartan 100mg  and HCTZ 25mg , in addition to amlodipine 10mg  daily . She reports good compliance with treatment. She is not having side effects.  She is not exercising. She is adherent to low salt diet.   Outside blood pressures are not being checked. She is experiencing none.  Patient denies exertional chest pressure/discomfort, lower extremity edema and palpitations.    Weight trend: stable Wt Readings from Last 3 Encounters:  01/26/19 199 lb (90.3 kg)  11/13/18 198 lb 12.8 oz (90.2 kg)  10/13/18 199 lb 9.6 oz (90.5 kg)    Current diet: well balanced  Tobacco Abuse: Continue to smoke 0.5 pack daily. She has tried and failed wellbutrin. She has been using chantix intermittently. Has reduced somewhat from 1 pack to 0.5 packs.   Toenail Fungus: Reports here toenails are hardened and yellowed. She has tried OTC antifungal cream for this without success.  Suspected Sleep Apnea: She was unable to schedule this due to location.   Allergies  Allergen Reactions  . Tape      Current Outpatient Medications:  .  albuterol (PROVENTIL HFA;VENTOLIN HFA) 108 (90 BASE) MCG/ACT inhaler, Inhale into the lungs., Disp: , Rfl:  .  amLODipine (NORVASC) 10 MG tablet, Take 1 tablet (10 mg total) by mouth daily., Disp: 90 tablet, Rfl: 0 .  aspirin 81 MG tablet, Take by mouth., Disp: , Rfl:  .  hydrochlorothiazide (HYDRODIURIL) 25 MG tablet, Take 1 tablet (25 mg total) by mouth daily., Disp: 90 tablet,  Rfl: 0 .  losartan (COZAAR) 100 MG tablet, Take 100 mg by mouth daily., Disp: , Rfl: 0 .  Multiple Vitamin (MULTIVITAMIN) tablet, Take 1 tablet by mouth daily., Disp: , Rfl:  .  varenicline (CHANTIX PAK) 0.5 MG X 11 & 1 MG X 42 tablet, One 0.5 mg tablet by mouth 1x daily for 3 days, then one 0.5 mg tablet twice daily for 4 days, then increase to one 1 mg tablet 2x daily., Disp: 53 tablet, Rfl: 0 .  diclofenac sodium (VOLTAREN) 1 % GEL, Apply 4 g topically 4 (four) times daily., Disp: 100 g, Rfl: 0  Review of Systems  Constitutional: Negative.   HENT: Negative.   Respiratory: Negative for cough.   Cardiovascular: Negative for chest pain, palpitations and leg swelling.  Musculoskeletal: Negative for arthralgias and joint swelling.  Allergic/Immunologic: Negative for environmental allergies.  Neurological: Negative for dizziness, tremors, syncope, weakness, light-headedness and headaches.    Social History   Tobacco Use  . Smoking status: Current Every Day Smoker    Packs/day: 0.50    Types: Cigarettes  . Smokeless tobacco: Never Used  Substance Use Topics  . Alcohol use: Yes    Alcohol/week: 7.0 standard drinks    Types: 7 Glasses of wine per week    Comment: One glass of red wine in the evenings.      Objective:   BP (!) 142/82 (BP Location: Left Arm,  Patient Position: Sitting, Cuff Size: Normal)   Pulse 72   Temp 98.7 F (37.1 C)   Resp 16   Ht 5\' 3"  (1.6 m)   Wt 199 lb (90.3 kg)   SpO2 97%   BMI 35.25 kg/m  Vitals:   01/26/19 1557  BP: (!) 142/82  Pulse: 72  Resp: 16  Temp: 98.7 F (37.1 C)  SpO2: 97%  Weight: 199 lb (90.3 kg)  Height: 5\' 3"  (1.6 m)     Physical Exam Constitutional:      Appearance: Normal appearance.  Cardiovascular:     Rate and Rhythm: Normal rate and regular rhythm.     Heart sounds: Normal heart sounds.  Pulmonary:     Effort: Pulmonary effort is normal.     Breath sounds: Normal breath sounds.  Skin:    General: Skin is warm and  dry.  Neurological:     Mental Status: She is alert and oriented to person, place, and time. Mental status is at baseline.  Psychiatric:        Mood and Affect: Mood normal.        Behavior: Behavior normal.         Assessment & Plan    1. Essential (primary) hypertension  Her blood pressure is better though not controlled. She wants to work on lifestyle and reducing smoking before medication.   - CBC with Differential - Comprehensive Metabolic Panel (CMET)  2. Colon cancer screening  - Cologuard  3. Suspected sleep apnea  Will try home sleep study.   - Home sleep test  4. Onychomycosis  Her toenail are hypertrophic. I have checked her liver enzymes which are elevated and so I will defer antifungals until her enzymes have resolved. I want her to repeat her cmet and eliminate alcohol and tylenol. Consider antifungal if enzymes resolve.   5. Osteoarthritis of both knees, unspecified osteoarthritis type  - diclofenac sodium (VOLTAREN) 1 % GEL; Apply 4 g topically 4 (four) times daily.  Dispense: 100 g; Refill: 0  The entirety of the information documented in the History of Present Illness, Review of Systems and Physical Exam were personally obtained by me. Portions of this information were initially documented by Lynford Humphrey, CMA and reviewed by me for thoroughness and accuracy.   Return in about 3 months (around 04/26/2019) for htn .  I have spent 25 minutes with this patient, >50% of which was spent on counseling and coordination of care.      Trinna Post, PA-C  [facility]

## 2019-01-27 LAB — CBC WITH DIFFERENTIAL/PLATELET
Basophils Absolute: 0 10*3/uL (ref 0.0–0.2)
Basos: 0 %
EOS (ABSOLUTE): 0.2 10*3/uL (ref 0.0–0.4)
Eos: 2 %
Hematocrit: 41.6 % (ref 34.0–46.6)
Hemoglobin: 14.6 g/dL (ref 11.1–15.9)
Immature Grans (Abs): 0 10*3/uL (ref 0.0–0.1)
Immature Granulocytes: 0 %
Lymphocytes Absolute: 2.7 10*3/uL (ref 0.7–3.1)
Lymphs: 27 %
MCH: 30.7 pg (ref 26.6–33.0)
MCHC: 35.1 g/dL (ref 31.5–35.7)
MCV: 87 fL (ref 79–97)
Monocytes Absolute: 0.7 10*3/uL (ref 0.1–0.9)
Monocytes: 7 %
Neutrophils Absolute: 6.5 10*3/uL (ref 1.4–7.0)
Neutrophils: 64 %
Platelets: 352 10*3/uL (ref 150–450)
RBC: 4.76 x10E6/uL (ref 3.77–5.28)
RDW: 13.3 % (ref 11.7–15.4)
WBC: 10.1 10*3/uL (ref 3.4–10.8)

## 2019-01-27 LAB — COMPREHENSIVE METABOLIC PANEL
ALT: 95 IU/L — ABNORMAL HIGH (ref 0–32)
AST: 86 IU/L — ABNORMAL HIGH (ref 0–40)
Albumin/Globulin Ratio: 1.7 (ref 1.2–2.2)
Albumin: 4.8 g/dL (ref 3.8–4.8)
Alkaline Phosphatase: 60 IU/L (ref 39–117)
BUN/Creatinine Ratio: 20 (ref 9–23)
BUN: 15 mg/dL (ref 6–24)
Bilirubin Total: 0.7 mg/dL (ref 0.0–1.2)
CO2: 22 mmol/L (ref 20–29)
Calcium: 9.7 mg/dL (ref 8.7–10.2)
Chloride: 96 mmol/L (ref 96–106)
Creatinine, Ser: 0.76 mg/dL (ref 0.57–1.00)
GFR calc Af Amer: 106 mL/min/{1.73_m2} (ref >59)
GFR calc non Af Amer: 92 mL/min/{1.73_m2} (ref >59)
Globulin, Total: 2.8 g/dL (ref 1.5–4.5)
Glucose: 82 mg/dL (ref 65–99)
Potassium: 3.8 mmol/L (ref 3.5–5.2)
Sodium: 137 mmol/L (ref 134–144)
Total Protein: 7.6 g/dL (ref 6.0–8.5)

## 2019-01-29 ENCOUNTER — Telehealth: Payer: Self-pay

## 2019-01-29 DIAGNOSIS — R748 Abnormal levels of other serum enzymes: Secondary | ICD-10-CM

## 2019-01-29 NOTE — Telephone Encounter (Signed)
Patient advised as below. Patient verbalizes understanding and is in agreement with treatment plan.  

## 2019-01-29 NOTE — Telephone Encounter (Signed)
-----   Message from Trinna Post, [place] sent at 01/27/2019  9:16 AM EST ----- CBC normal but liver enzymes elevated. Would not want to start anti-fungal until liver enzymes return to normal. She should eliminate any alcohol and tylenol and recheck in one month. If at that time enzymes are normal, we can start medication. Please order future CMET under dx code elevated liver enzymes. She can come in for lab only visit. She can also cancel her 6 week follow up as that was for antifungal.

## 2019-02-01 NOTE — Patient Instructions (Signed)

## 2019-02-08 ENCOUNTER — Telehealth: Payer: Self-pay | Admitting: Physician Assistant

## 2019-02-08 NOTE — Telephone Encounter (Signed)
Order for home sleep study faxed to [facility]

## 2019-02-15 ENCOUNTER — Encounter: Payer: Self-pay | Admitting: Physician Assistant

## 2019-02-15 LAB — PULMONARY FUNCTION TEST

## 2019-02-16 ENCOUNTER — Encounter: Payer: Self-pay | Admitting: Physician Assistant

## 2019-02-21 ENCOUNTER — Telehealth: Payer: Self-pay

## 2019-02-21 NOTE — Telephone Encounter (Signed)
No answer/unable to LM-Just wanted to let patient know Rx for cpap machine was signed and faxed today.

## 2019-03-06 ENCOUNTER — Encounter: Payer: Self-pay | Admitting: Physician Assistant

## 2019-03-06 ENCOUNTER — Other Ambulatory Visit: Payer: Self-pay

## 2019-03-06 ENCOUNTER — Ambulatory Visit (INDEPENDENT_AMBULATORY_CARE_PROVIDER_SITE_OTHER): Payer: BLUE CROSS/BLUE SHIELD | Admitting: Physician Assistant

## 2019-03-06 VITALS — BP 146/101 | HR 84 | Temp 98.5°F | Resp 16 | Wt 199.8 lb

## 2019-03-06 DIAGNOSIS — R945 Abnormal results of liver function studies: Secondary | ICD-10-CM

## 2019-03-06 DIAGNOSIS — F1010 Alcohol abuse, uncomplicated: Secondary | ICD-10-CM

## 2019-03-06 DIAGNOSIS — I10 Essential (primary) hypertension: Secondary | ICD-10-CM

## 2019-03-06 DIAGNOSIS — R7989 Other specified abnormal findings of blood chemistry: Secondary | ICD-10-CM

## 2019-03-06 NOTE — Progress Notes (Signed)
Patient: Stephanie Jacobs Female    DOB: May 20, 1966   53 y.o.   MRN: [id number] Visit Date: 03/09/2019  Today's Provider: Trinna Post, PA-C   Chief Complaint  Patient presents with  . Hypertension   Subjective:     HPI   Hypertension, follow-up:  BP Readings from Last 3 Encounters:  03/06/19 (!) 146/101  01/26/19 (!) 142/82  11/13/18 (!) 151/85    She was last seen for hypertension 1 months ago.  BP at that visit was 142/82. Management changes since that visit include none. She reports excellent compliance with treatment. Reports losartan was on back order so she ran out for two weeks and just started taking this over the past three days. She is also taking HCTZ 25 mg daily and amlodipine 10 mg daily.  She is not having side effects.  She is exercising. She is adherent to low salt diet.   Outside blood pressures are systolic ranging from 811-914 and diastolic ranging from 78-29. She is experiencing none.  Patient denies chest pain, chest pressure/discomfort, claudication, dyspnea, exertional chest pressure/discomfort, fatigue, irregular heart beat, lower extremity edema, near-syncope, orthopnea, palpitations, paroxysmal nocturnal dyspnea, syncope and tachypnea.   Cardiovascular risk factors include hypertension and tobacco use Use of agents associated with hypertension:aspirin 81mg .     Weight trend: stable Wt Readings from Last 3 Encounters:  03/06/19 199 lb 12.8 oz (90.6 kg)  01/26/19 199 lb (90.3 kg)  11/13/18 198 lb 12.8 oz (90.2 kg)    Current diet: in general, a "healthy" diet    Tobacco Abuse:   Tried chantix, this has not worked for her.   Alcohol Abuse  Last visit, patient expressed concern about toenail fungus. We checked liver function prior to initiating antifungal and liver enzymes were elevated 2-3 X's upper limit of normal. Labs as below. Patient was instructed to stop alcohol use and Tylenol use. Today patient reports she has not  eliminated alcohol consumption but has limited drinks to 2 a day. On 02/22/2018 patient reported her alcohol consumption was three glasses per day. She was counseled that this is excessive and that one glass a day is the limit for women. Patient reports today that she has actually been drinking one bottle of wine daily since 2011. She relates this to anxiety caused by her children. Expresses shame in relation to her alcohol consumption. Says she did not want to be labeled an alcoholic. Reports she has spoken with her friends who said she should cut down but did not deem her alcohol consumption excessive. 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 Denies history of DUI or falls related to intoxication.   CMP Latest Ref Rng & Units 03/06/2019 01/26/2019 09/04/2018  Glucose 65 - 99 mg/dL 89 82 106(H)  BUN 6 - 24 mg/dL 7 15 8   Creatinine 0.57 - 1.00 mg/dL 0.63 0.76 0.69  Sodium 134 - 144 mmol/L 135 137 135  Potassium 3.5 - 5.2 mmol/L 4.1 3.8 4.0  Chloride 96 - 106 mmol/L 95(L) 96 97(L)  CO2 20 - 29 mmol/L 23 22 26   Calcium 8.7 - 10.2 mg/dL 9.6 9.7 9.2  Total Protein 6.0 - 8.5 g/dL 7.7 7.6 -  Total Bilirubin 0.0 - 1.2 mg/dL 0.9 0.7 -  Alkaline Phos 39 - 117 IU/L 61 60 -  AST 0 - 40 IU/L 68(H) 86(H) -  ALT 0 - 32 IU/L 50(H) 95(H) -    1 bottle of  wine a day since 2011   ------------------------------------------------------------------------   Allergies  Allergen Reactions  . Tape      Current Outpatient Medications:  .  albuterol (PROVENTIL HFA;VENTOLIN HFA) 108 (90 BASE) MCG/ACT inhaler, Inhale into the lungs., Disp: , Rfl:  .  amLODipine (NORVASC) 10 MG tablet, Take 1 tablet (10 mg total) by mouth daily., Disp: 90 tablet, Rfl: 0 .  aspirin 81 MG tablet, Take by mouth., Disp: , Rfl:  .  diclofenac sodium (VOLTAREN) 1 % GEL, Apply 4 g topically 4 (four) times daily., Disp: 100 g, Rfl: 0 .  hydrochlorothiazide (HYDRODIURIL) 25 MG tablet, Take 1 tablet (25 mg total) by mouth  daily., Disp: 90 tablet, Rfl: 0 .  losartan (COZAAR) 100 MG tablet, Take 100 mg by mouth daily., Disp: , Rfl: 0 .  Multiple Vitamin (MULTIVITAMIN) tablet, Take 1 tablet by mouth daily., Disp: , Rfl:   Review of Systems  Social History   Tobacco Use  . Smoking status: Current Every Day Smoker    Packs/day: 0.50    Types: Cigarettes  . Smokeless tobacco: Never Used  Substance Use Topics  . Alcohol use: Yes    Alcohol/week: 7.0 standard drinks    Types: 7 Glasses of wine per week    Comment: One glass of red wine in the evenings.      Objective:   BP (!) 146/101   Pulse 84   Temp 98.5 F (36.9 C) (Oral)   Resp 16   Wt 199 lb 12.8 oz (90.6 kg)   BMI 35.39 kg/m  Vitals:   03/06/19 1548  BP: (!) 146/101  Pulse: 84  Resp: 16  Temp: 98.5 F (36.9 C)  TempSrc: Oral  Weight: 199 lb 12.8 oz (90.6 kg)     Physical Exam Constitutional:      Appearance: Normal appearance.  Cardiovascular:     Rate and Rhythm: Normal rate and regular rhythm.     Heart sounds: Normal heart sounds.  Pulmonary:     Effort: Pulmonary effort is normal.     Breath sounds: Normal breath sounds.  Skin:    General: Skin is warm and dry.  Neurological:     Mental Status: She is alert and oriented to person, place, and time. Mental status is at baseline.  Psychiatric:        Mood and Affect: Mood normal.        Behavior: Behavior normal.         Assessment & Plan    1. Essential (primary) hypertension  Uncontrolled. Patient would not like to adjust medication at this time. Patient still smokes. F/u 2 months.   2. Abnormal LFTs  On recheck, have trended down but remain elevated. Suspect this is secondary to alcohol use. Defer antifungal until enzymes have normalized.   - Comprehensive Metabolic Panel (CMET)  3. Alcohol abuse  Have had frank discussion with patient about her alcohol consumption, which I have advised constitutes alcohol abuse. Previously she had reported drinking three  glasses daily but today reports drinking one bottle of wine daily for the past 9 years. Have again advised that this is alcohol abuse. The fact that she was ashamed to admit this as well as the fact that she has reduced her intake to still an excessive level but not eliminated her drinking in light of demonstrated harmful effects, her liver enzymes, aligns with addictive behavior. Advised that she needs to eliminate alcohol completely and she should not be drinking. She does  not appear accepting of this. Suggest other strategies such as counseling to manage anxiety and she does not feel she needs this.  The entirety of the information documented in the History of Present Illness, Review of Systems and Physical Exam were personally obtained by me. Portions of this information were initially documented by Lyndel Pleasure, CMA and reviewed by me for thoroughness and accuracy.   I have spent 25 minutes with this patient, >50% of which was spent on counseling and coordination of care.        Trinna Post, PA-C  [facility]

## 2019-03-07 ENCOUNTER — Telehealth: Payer: Self-pay

## 2019-03-07 LAB — COMPREHENSIVE METABOLIC PANEL
ALT: 50 IU/L — ABNORMAL HIGH (ref 0–32)
AST: 68 IU/L — ABNORMAL HIGH (ref 0–40)
Albumin/Globulin Ratio: 1.7 (ref 1.2–2.2)
Albumin: 4.8 g/dL (ref 3.8–4.8)
Alkaline Phosphatase: 61 IU/L (ref 39–117)
BUN/Creatinine Ratio: 11 (ref 9–23)
BUN: 7 mg/dL (ref 6–24)
Bilirubin Total: 0.9 mg/dL (ref 0.0–1.2)
CO2: 23 mmol/L (ref 20–29)
Calcium: 9.6 mg/dL (ref 8.7–10.2)
Chloride: 95 mmol/L — ABNORMAL LOW (ref 96–106)
Creatinine, Ser: 0.63 mg/dL (ref 0.57–1.00)
GFR calc Af Amer: 121 mL/min/{1.73_m2} (ref >59)
GFR calc non Af Amer: 105 mL/min/{1.73_m2} (ref >59)
Globulin, Total: 2.9 g/dL (ref 1.5–4.5)
Glucose: 89 mg/dL (ref 65–99)
Potassium: 4.1 mmol/L (ref 3.5–5.2)
Sodium: 135 mmol/L (ref 134–144)
Total Protein: 7.7 g/dL (ref 6.0–8.5)

## 2019-03-07 NOTE — Telephone Encounter (Signed)
-----   Message from Trinna Post, [place] sent at 03/07/2019  8:16 AM EDT ----- Liver enzymes improved but not back to normal. Needs to eliminate alcohol. See her in a couple of months for BP.

## 2019-03-07 NOTE — Telephone Encounter (Signed)
Patient advised as directed below. 

## 2019-03-09 DIAGNOSIS — F1010 Alcohol abuse, uncomplicated: Secondary | ICD-10-CM | POA: Insufficient documentation

## 2019-03-09 NOTE — Patient Instructions (Signed)
Alcoholic Liver Disease  Alcoholic liver disease is liver damage that is caused by drinking a lot of alcohol for a long time. If you have this disease, you must stop drinking alcohol. Follow these instructions at home:   Do not drink alcohol. Follow your treatment plan. Work with your doctor if you need help.  Think about joining an alcohol support group.  Take over-the-counter and prescription medicines only as told by your doctor. These include vitamins.  Do not use medicines or eat foods that have alcohol in them, unless your doctor says that it is safe.  Follow instructions from your doctor about eating a healthy diet.  Keep all follow-up visits as told by your doctor. This is important. Contact a doctor if:  You get a fever.  Your skin starts to look more yellow, pale, or dark.  You get headaches. Get help right away if:  You throw up (vomit) blood.  You have bright red blood in your poop (stool).  Your poop looks black or looks like tar.  You have trouble: ? Thinking. ? Walking. ? Balancing. ? Breathing. Summary  Alcoholic liver disease is liver damage that is caused by drinking a lot of alcohol for a long time.  If you have this disease, you must stop drinking alcohol. Follow your treatment plan, and work with your doctor as needed.  Think about joining an alcohol support group. This information is not intended to replace advice given to you by your health care provider. Make sure you discuss any questions you have with your health care provider. Document Released: 10/03/2009 Document Revised: 08/26/2017 Document Reviewed: 08/26/2017 Elsevier Interactive Patient Education  2019 Elsevier Inc.  

## 2019-03-23 ENCOUNTER — Encounter: Payer: Self-pay | Admitting: Physician Assistant

## 2019-04-19 ENCOUNTER — Other Ambulatory Visit: Payer: Self-pay | Admitting: Physician Assistant

## 2019-04-19 DIAGNOSIS — M170 Bilateral primary osteoarthritis of knee: Secondary | ICD-10-CM

## 2019-04-19 DIAGNOSIS — I10 Essential (primary) hypertension: Secondary | ICD-10-CM

## 2019-04-20 NOTE — Telephone Encounter (Signed)
Please review

## 2019-05-07 ENCOUNTER — Other Ambulatory Visit: Payer: Self-pay | Admitting: Physician Assistant

## 2019-05-07 DIAGNOSIS — M170 Bilateral primary osteoarthritis of knee: Secondary | ICD-10-CM

## 2019-05-08 ENCOUNTER — Ambulatory Visit: Payer: BLUE CROSS/BLUE SHIELD | Admitting: Physician Assistant

## 2019-05-08 NOTE — Telephone Encounter (Signed)
Have you seen this patient before somewhere else/

## 2019-05-08 NOTE — Telephone Encounter (Signed)
No, I don't think so.

## 2019-05-15 DIAGNOSIS — Z03818 Encounter for observation for suspected exposure to other biological agents ruled out: Secondary | ICD-10-CM | POA: Diagnosis not present

## 2019-06-19 ENCOUNTER — Encounter: Payer: Self-pay | Admitting: Physician Assistant

## 2019-06-20 ENCOUNTER — Encounter: Payer: Self-pay | Admitting: Physician Assistant

## 2019-06-20 DIAGNOSIS — N939 Abnormal uterine and vaginal bleeding, unspecified: Secondary | ICD-10-CM

## 2019-06-21 ENCOUNTER — Telehealth: Payer: Self-pay | Admitting: Obstetrics & Gynecology

## 2019-06-21 NOTE — Telephone Encounter (Signed)
[facility] referring for Episode of heavy vaginal bleeding. Called and left voicemail for patient to call back to be schedule

## 2019-06-28 ENCOUNTER — Other Ambulatory Visit: Payer: Self-pay | Admitting: Physician Assistant

## 2019-07-03 ENCOUNTER — Ambulatory Visit: Payer: BC Managed Care – PPO | Admitting: Obstetrics & Gynecology

## 2019-07-03 ENCOUNTER — Other Ambulatory Visit: Payer: Self-pay

## 2019-07-03 ENCOUNTER — Encounter: Payer: Self-pay | Admitting: Obstetrics & Gynecology

## 2019-07-03 VITALS — BP 130/90 | Ht 65.0 in | Wt 206.0 lb

## 2019-07-03 DIAGNOSIS — N921 Excessive and frequent menstruation with irregular cycle: Secondary | ICD-10-CM | POA: Insufficient documentation

## 2019-07-03 DIAGNOSIS — D219 Benign neoplasm of connective and other soft tissue, unspecified: Secondary | ICD-10-CM | POA: Diagnosis not present

## 2019-07-03 DIAGNOSIS — N951 Menopausal and female climacteric states: Secondary | ICD-10-CM

## 2019-07-03 MED ORDER — MEDROXYPROGESTERONE ACETATE 10 MG PO TABS: 10.0000 mg | ORAL_TABLET | Freq: Every day | ORAL | 0 refills | Status: DC

## 2019-07-03 NOTE — Progress Notes (Signed)
Consultant: [facility]; Carles Collet, PA-C Consulting Reason- Excessive Bleeding  HPI:      Ms. Stephanie Jacobs is a 53 y.o. G6P4 who LMP was Patient's last menstrual period was 06/04/2019., presents today for a problem visit.  She complains of menometrorrhagia that  began several months ago and its severity is described as severe.  She has had regular periods every 28 days and they are associated with mild menstrual cramping.  However, in March she had a month long period w many heavy days; April and May only spotting; and June again a prolonged period still bleeding today.  Associated w more cramping than before.  She has used the following for attempts at control: maxi pad.  Has soiled clothes and interferes w work.  FH Fibroids and Uterine cancer.  Previous evaluation: none. Prior Diagnosis: dysfunctional uterine bleeding. Previous Treatment: none.  She is single partner, contraception - tubal ligation.  Hx of STDs: none. She is perimenopausal.  PMHx: She  has a past medical history of Hypertension and Sickle cell trait ([place]). Also,  has a past surgical history that includes Tubal ligation (1995) and Breast biopsy (Right, 05/02/2018)., family history includes Diabetes in her mother; Hypertension in her mother; Sickle cell anemia in her brother; Sickle cell trait in her brother.,  reports that she has been smoking cigarettes. She has been smoking about 0.50 packs per day. She has never used smokeless tobacco. She reports current alcohol use of about 7.0 standard drinks of alcohol per week. She reports that she does not use drugs.  She  Current Outpatient Medications:  .  albuterol (PROVENTIL HFA;VENTOLIN HFA) 108 (90 BASE) MCG/ACT inhaler, Inhale into the lungs., Disp: , Rfl:  .  amLODipine (NORVASC) 10 MG tablet, Take 1 tablet (10 mg total) by mouth daily., Disp: 90 tablet, Rfl: 0 .  aspirin 81 MG tablet, Take by mouth., Disp: , Rfl:  .  diclofenac sodium (VOLTAREN) 1 %  GEL, Apply 4 g topically 4 (four) times daily., Disp: 100 g, Rfl: 0 .  hydrochlorothiazide (HYDRODIURIL) 25 MG tablet, Take 1 tablet (25 mg total) by mouth daily., Disp: 90 tablet, Rfl: 0 .  losartan (COZAAR) 100 MG tablet, Take 100 mg by mouth daily., Disp: , Rfl: 0 .  Multiple Vitamin (MULTIVITAMIN) tablet, Take 1 tablet by mouth daily., Disp: , Rfl:  .  medroxyPROGESTERone (PROVERA) 10 MG tablet, Take 1 tablet (10 mg total) by mouth daily for 10 days., Disp: 10 tablet, Rfl: 0  Also, is allergic to tape.  Review of Systems  Constitutional: Negative for chills, fever and malaise/fatigue.  HENT: Negative for congestion, sinus pain and sore throat.   Eyes: Negative for blurred vision and pain.  Respiratory: Negative for cough and wheezing.   Cardiovascular: Negative for chest pain and leg swelling.  Gastrointestinal: Negative for abdominal pain, constipation, diarrhea, heartburn, nausea and vomiting.  Genitourinary: Negative for dysuria, frequency, hematuria and urgency.  Musculoskeletal: Negative for back pain, joint pain, myalgias and neck pain.  Skin: Negative for itching and rash.  Neurological: Negative for dizziness, tremors and weakness.  Endo/Heme/Allergies: Does not bruise/bleed easily.  Psychiatric/Behavioral: Negative for depression. The patient is not nervous/anxious and does not have insomnia.     Objective: BP 130/90   Ht 5\' 5"  (1.651 m)   Wt 206 lb (93.4 kg)   LMP 06/04/2019   BMI 34.28 kg/m  Physical Exam Constitutional:      General: She is not in acute distress.  Appearance: She is well-developed.  Genitourinary:     Pelvic exam was performed with patient supine.     Vulva, inguinal canal, urethra, bladder, vagina, cervix, right adnexa, left adnexa and rectum normal.     No lesions in the vagina.     No vaginal bleeding.     No cervical polyp.     Uterus is enlarged, irregular and mobile.     No uterine mass detected.    Uterus is midaxial and globular.      No right or left adnexal mass present.     Right adnexa not tender.     Left adnexa not tender.  HENT:     Head: Normocephalic and atraumatic. No laceration.     Right Ear: Hearing normal.     Left Ear: Hearing normal.     Mouth/Throat:     Pharynx: Uvula midline.  Eyes:     Pupils: Pupils are equal, round, and reactive to light.  Neck:     Musculoskeletal: Normal range of motion and neck supple.     Thyroid: No thyromegaly.  Cardiovascular:     Rate and Rhythm: Normal rate and regular rhythm.     Heart sounds: No murmur. No friction rub. No gallop.   Pulmonary:     Effort: Pulmonary effort is normal. No respiratory distress.     Breath sounds: Normal breath sounds. No wheezing.  Abdominal:     General: Bowel sounds are normal. There is no distension.     Palpations: Abdomen is soft.     Tenderness: There is no abdominal tenderness. There is no rebound.  Musculoskeletal: Normal range of motion.  Neurological:     Mental Status: She is alert and oriented to person, place, and time.     Cranial Nerves: No cranial nerve deficit.  Skin:    General: Skin is warm and dry.  Psychiatric:        Judgment: Judgment normal.  Vitals signs reviewed.     ASSESSMENT/PLAN:  menometrorrhagia  Problem List Items Addressed This Visit      Other   Menometrorrhagia - Primary   Relevant Medications   medroxyPROGESTERone (PROVERA) 10 MG tablet   Other Relevant Orders   US PELVIC COMPLETE WITH TRANSVAGINAL   TSH   FSH/LH   Estradiol    Other Visit Diagnoses    Peri-menopausal       Relevant Orders   TSH   FSH/LH   Estradiol   Fibroids        Provera for short term control of current prolonged bleeding [place] assessment of fibroids; also see how respond to hormones No estrogen planned due to h/o HTN, smoking history (risk for DVT or worsening HTN)  Patient has abnormal uterine bleeding . She has a normal exam with evidence for fibroid uterus today, with no evidence of lesions.   Evaluation includes the following: exam, labs such as hormonal testing, and pelvic ultrasound to evaluate for any structural gynecologic abnormalities.  Patient to follow up after testing.  Treatment option for menorrhagia or menometrorrhagia discussed in great detail with the patient.  Options include hormonal therapy, IUD therapy such as Mirena, D&C, Ablation, and Hysterectomy.  The pros and cons of each option discussed with patient.   Barnett Applebaum, MD, [facility], [facility] 07/03/2019  9:37 AM

## 2019-07-03 NOTE — Patient Instructions (Signed)
Abnormal Uterine Bleeding Abnormal uterine bleeding is unusual bleeding from the uterus. It includes:  Bleeding or spotting between periods.  Bleeding after sex.  Bleeding that is heavier than normal.  Periods that last longer than usual.  Bleeding after menopause. Abnormal uterine bleeding can affect women at various stages in life, including teenagers, women in their reproductive years, pregnant women, and women who have reached menopause. Common causes of abnormal uterine bleeding include:  Pregnancy.  Growths of tissue (polyps).  A noncancerous tumor in the uterus (fibroid).  Infection.  Cancer.  Hormonal imbalances. Any type of abnormal bleeding should be evaluated by a health care provider. Many cases are minor and simple to treat, while others are more serious. Treatment will depend on the cause of the bleeding. Follow these instructions at home:  Monitor your condition for any changes.  Do not use tampons, douche, or have sex if told by your health care provider.  Change your pads often.  Get regular exams that include pelvic exams and cervical cancer screening.  Keep all follow-up visits as told by your health care provider. This is important. Contact a health care provider if:  Your bleeding lasts for more than one week.  You feel dizzy at times.  You feel nauseous or you vomit. Get help right away if:  You pass out.  Your bleeding soaks through a pad every hour.  You have abdominal pain.  You have a fever.  You become sweaty or weak.  You pass large blood clots from your vagina. Summary  Abnormal uterine bleeding is unusual bleeding from the uterus.  Any type of abnormal bleeding should be evaluated by a health care provider. Many cases are minor and simple to treat, while others are more serious.  Treatment will depend on the cause of the bleeding. This information is not intended to replace advice given to you by your health care provider.  Make sure you discuss any questions you have with your health care provider. Document Released: 12/06/2005 Document Revised: 03/15/2018 Document Reviewed: 01/07/2017 Elsevier Patient Education  2020 Elsevier Inc.  

## 2019-07-04 LAB — ESTRADIOL: Estradiol: 40.4 pg/mL

## 2019-07-04 LAB — FSH/LH
FSH: 26 m[IU]/mL
LH: 18.4 m[IU]/mL

## 2019-07-04 LAB — TSH: TSH: 2.69 u[IU]/mL (ref 0.450–4.500)

## 2019-07-05 ENCOUNTER — Other Ambulatory Visit: Payer: Self-pay | Admitting: Physician Assistant

## 2019-07-05 DIAGNOSIS — M170 Bilateral primary osteoarthritis of knee: Secondary | ICD-10-CM

## 2019-07-05 MED ORDER — DICLOFENAC SODIUM 1 % TD GEL: 4.0000 g | Freq: Four times a day (QID) | TRANSDERMAL | 0 refills | Status: DC

## 2019-07-05 NOTE — Telephone Encounter (Signed)
[facility] faxed refill request for the following medications:  diclofenac sodium (VOLTAREN) 1 % GEL   Please advise.

## 2019-07-16 IMAGING — MG MM DIGITAL DIAGNOSTIC UNILAT*R* W/ TOMO W/ CAD
6 of 12 series · 6 of 36 positions shown · non-contrast
Comparison: 04/12/2018

CLINICAL DATA: The patient returns after new baseline exam for
evaluation of a possible RIGHT breast asymmetry.

EXAM:
DIGITAL DIAGNOSTIC RIGHT MAMMOGRAM WITH CAD AND TOMO
ULTRASOUND RIGHT BREAST

[R CC synth-2D (1 of 2)]
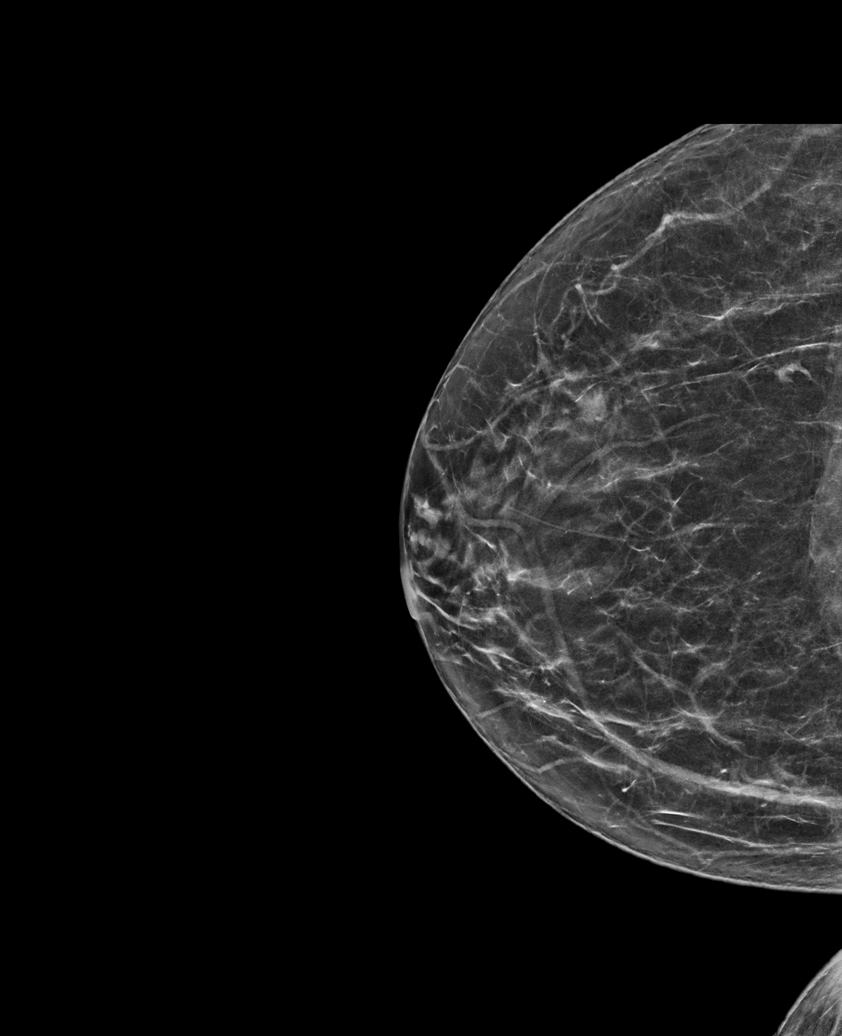

[R CC synth-2D (2 of 2)]
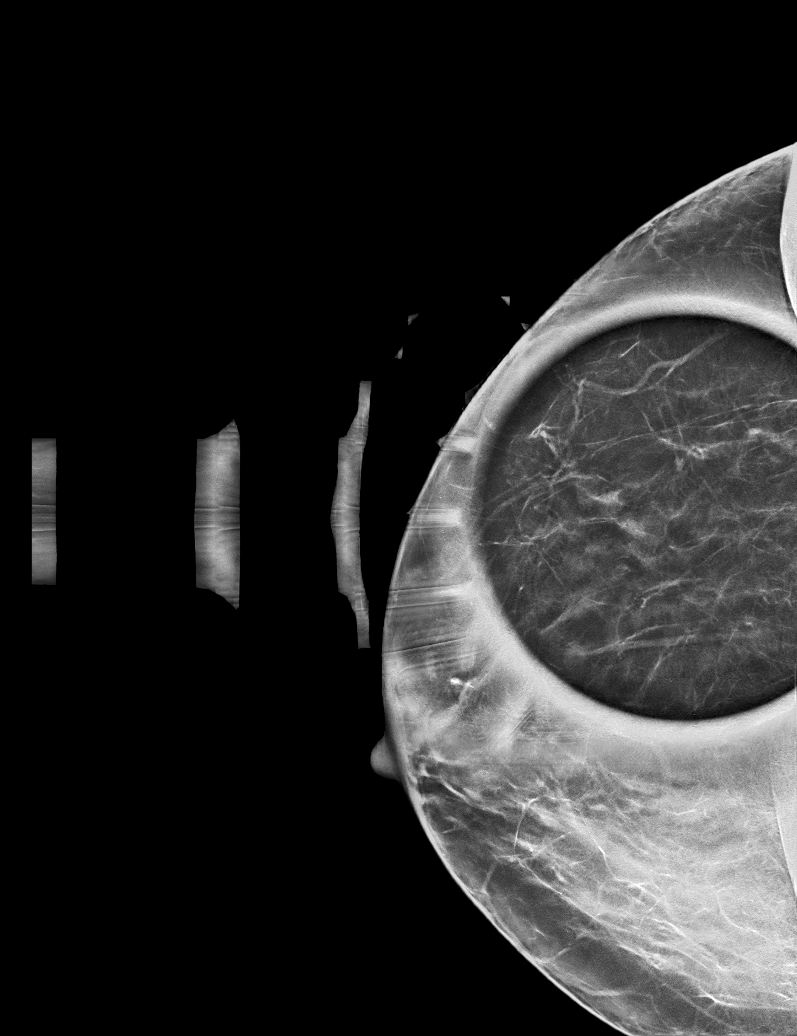

[R MLO synth-2D (1 of 4)]
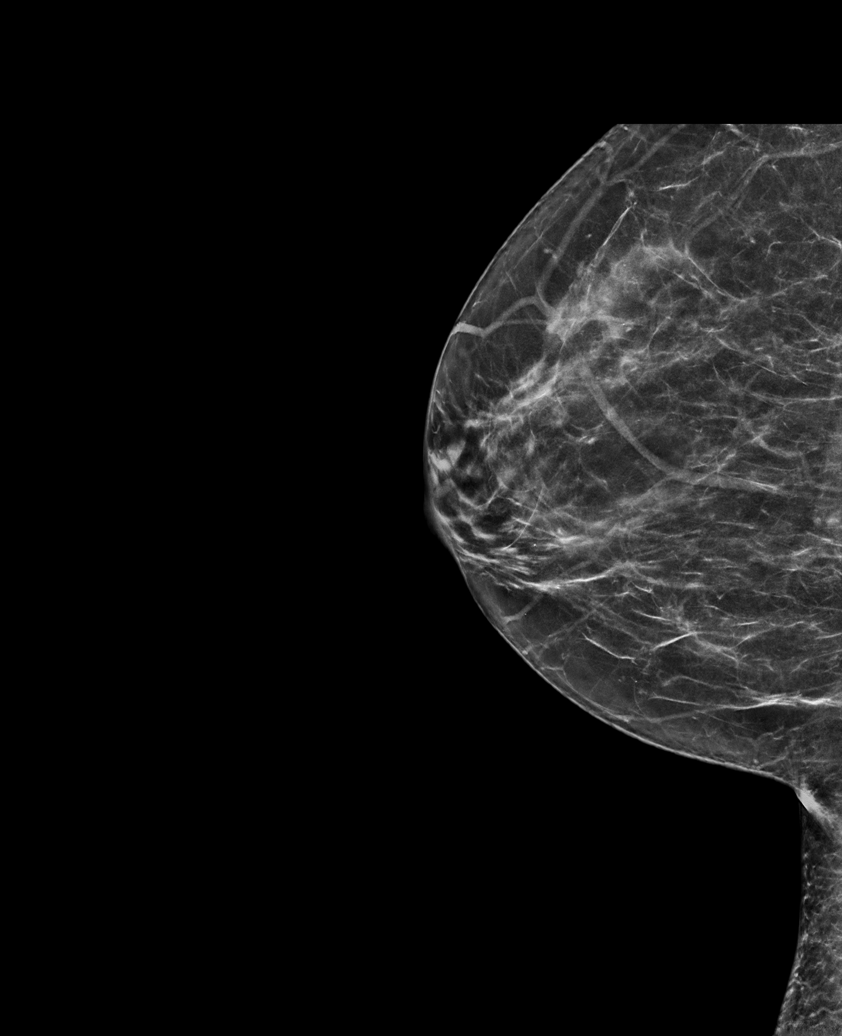

[R MLO synth-2D (2 of 4)]
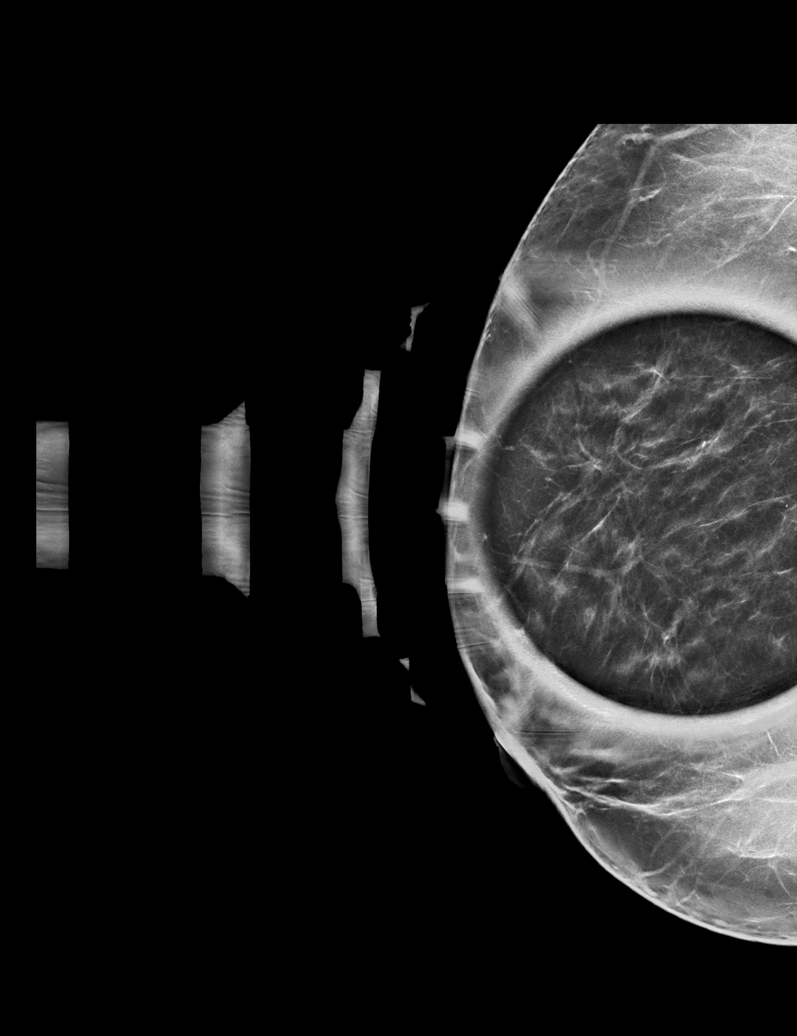

[R MLO synth-2D (3 of 4)]
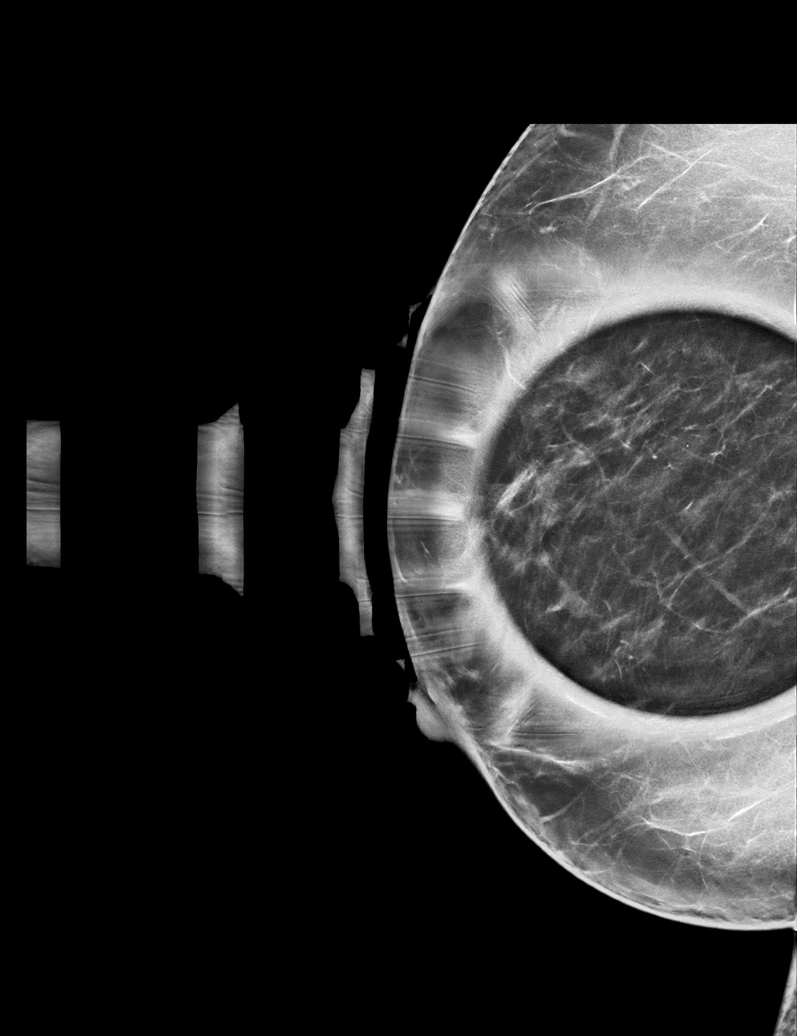

[R MLO synth-2D (4 of 4)]
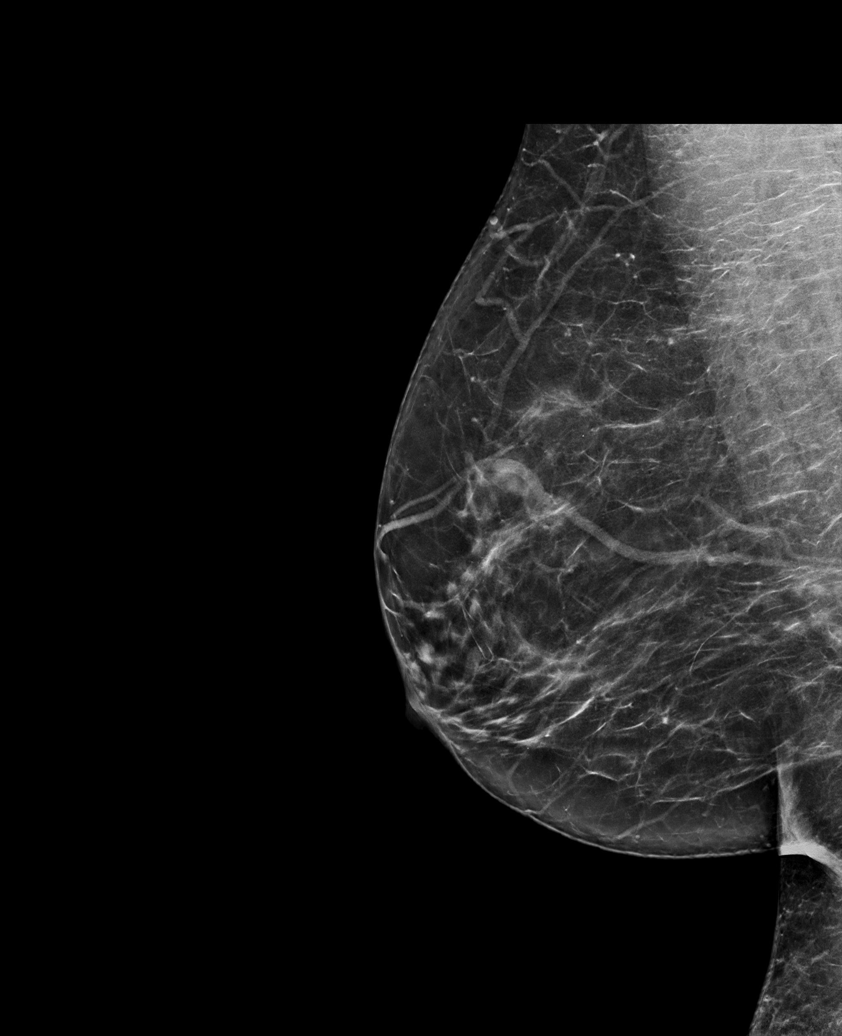

[6 of 36 positions shown; findings below may reference images not displayed]

ACR Breast Density Category b: There are scattered areas of
fibroglandular density.
FINDINGS: Full paddle tomosynthesis images confirm presence of a mass
associated with distortion in the UPPER-OUTER QUADRANT of the RIGHT
breast.

Mammographic images were processed with CAD.

On physical exam, I palpate no abnormality in the UPPER-OUTER
QUADRANT of the RIGHT breast.

Targeted ultrasound is performed, showing normal appearing
fibroglandular tissue throughout the UPPER OUTER QUADRANT of the
RIGHT breast. Evaluation of the RIGHT axilla is negative.

Follow-up spot 2D/3D images are performed of the UPPER-OUTER
QUADRANT of the RIGHT breast, showing persistent mild distortion in
the UPPER-OUTER QUADRANT warranting tissue diagnosis.
IMPRESSION: Distortion without Sonographic correlate in the UPPER OUTER QUADRANT
of the RIGHT breast.

RECOMMENDATION:
Stereotactic guided core biopsy of RIGHT breast distortion.

I have discussed the findings and recommendations with the patient.
Results were also provided in writing at the conclusion of the
visit. If applicable, a reminder letter will be sent to the patient
regarding the next appointment.

BI-RADS CATEGORY  4: Suspicious.

## 2019-07-18 ENCOUNTER — Encounter: Payer: Self-pay | Admitting: Obstetrics & Gynecology

## 2019-07-18 ENCOUNTER — Ambulatory Visit (INDEPENDENT_AMBULATORY_CARE_PROVIDER_SITE_OTHER): Payer: BC Managed Care – PPO | Admitting: Obstetrics & Gynecology

## 2019-07-18 ENCOUNTER — Other Ambulatory Visit: Payer: Self-pay

## 2019-07-18 ENCOUNTER — Other Ambulatory Visit: Payer: Self-pay | Admitting: Obstetrics & Gynecology

## 2019-07-18 ENCOUNTER — Ambulatory Visit (INDEPENDENT_AMBULATORY_CARE_PROVIDER_SITE_OTHER): Payer: BC Managed Care – PPO

## 2019-07-18 VITALS — BP 130/80 | Ht 65.0 in | Wt 209.0 lb

## 2019-07-18 DIAGNOSIS — N921 Excessive and frequent menstruation with irregular cycle: Secondary | ICD-10-CM | POA: Diagnosis not present

## 2019-07-18 DIAGNOSIS — D250 Submucous leiomyoma of uterus: Secondary | ICD-10-CM | POA: Diagnosis not present

## 2019-07-18 DIAGNOSIS — D252 Subserosal leiomyoma of uterus: Secondary | ICD-10-CM | POA: Diagnosis not present

## 2019-07-18 DIAGNOSIS — D219 Benign neoplasm of connective and other soft tissue, unspecified: Secondary | ICD-10-CM

## 2019-07-18 NOTE — Progress Notes (Signed)
  HPI: Pt has cointinued heavy periods.  She took Provera which helped for 5 days but bleeding restarted even while still taking.  Some pain. Also reports bladder frequency and stress incontinence, which may be also related to fibroids.  Ultrasound demonstrates 2 fibroids, see below  PMHx: She  has a past medical history of Hypertension and Sickle cell trait ([place]). Also,  has a past surgical history that includes Tubal ligation (1995) and Breast biopsy (Right, 05/02/2018)., family history includes Diabetes in her mother; Hypertension in her mother; Sickle cell anemia in her brother; Sickle cell trait in her brother.,  reports that she has been smoking cigarettes. She has been smoking about 0.50 packs per day. She has never used smokeless tobacco. She reports current alcohol use of about 7.0 standard drinks of alcohol per week. She reports that she does not use drugs.  She has a current medication list which includes the following prescription(s): albuterol, amlodipine, aspirin, diclofenac sodium, hydrochlorothiazide, losartan, multivitamin, and medroxyprogesterone. Also, is allergic to tape.  Review of Systems  All other systems reviewed and are negative.   Objective: BP 130/80   Ht 5\' 5"  (1.651 m)   Wt 209 lb (94.8 kg)   BMI 34.78 kg/m   Physical examination Constitutional NAD, Conversant  Skin No rashes, lesions or ulceration.   Extremities: Moves all appropriately.  Normal ROM for age. No lymphadenopathy.  Neuro: Grossly intact  Psych: Oriented to PPT.  Normal mood. Normal affect.   US Pelvis Transvaginal Non-ob (tv Only)  Result Date: 07/18/2019 Patient Name: Stephanie Jacobs DOB: 1966-12-18 MRN: [id number] ULTRASOUND REPORT Location: [facility] Date of Service: 07/18/2019 Indications:Abnormal Uterine Bleeding Findings: The uterus is anteverted and measures 11.8 x 8.7 x 6.4 cm. Echo texture is heterogenous with evidence of focal masses. Within the uterus are multiple suspected  fibroids measuring: Fibroid 1:55.6 x 59.1 x 56.8 mm possibly less than 50% submucosal, fundal right Fibroid 2:23.7 x 18.9 x 28.9 mm subserosal posterior left The Endometrium measures 7.9 mm. Right Ovary measures 4.3 x 3.1 x 2.6 cm. It is normal in appearance. Left Ovary measures 4.1 x 2.0 x 2.1 cm. It is normal in appearance. Survey of the adnexa demonstrates no adnexal masses. There is no free fluid in the cul de sac. Impression: 1. There are two uterine fibroids. The largest one may be less than 50% submucosal. 2. Normal ovaries. Recommendations: 1.Clinical correlation with the patient's History and Physical Exam. Gweneth Dimitri, RT Review of ULTRASOUND.    I have personally reviewed images and report of recent ultrasound done at [facility].    Plan of management to be discussed with patient. Barnett Applebaum, MD, [facility], [facility] 07/18/2019  3:52 PM   Assessment:    ICD-10-CM   1. Fibroids  D21.9   2. Menometrorrhagia  N92.1   Options discussed, prefers hysterectomy, preservation of ovaries Info provided To schedule Aug  A total of 15 minutes were spent face-to-face with the patient during this encounter and over half of that time dealt with counseling and coordination of care.  Barnett Applebaum, MD, [facility], [facility] 07/18/2019  4:12 PM

## 2019-07-18 NOTE — Patient Instructions (Signed)
Total Laparoscopic Hysterectomy A total laparoscopic hysterectomy is a minimally invasive surgery to remove the uterus and cervix. The fallopian tubes and ovaries can also be removed (bilateral salpingo-oophorectomy) during this surgery, if necessary. This procedure may be done to treat problems such as:  Noncancerous growths in the uterus (uterine fibroids) that cause symptoms.  A condition that causes the lining of the uterus (endometrium) to grow in other areas (endometriosis).  Problems with pelvic support. This is caused by weakened muscles of the pelvis following vaginal childbirth or menopause.  Cancer of the cervix, ovaries, uterus, or endometrium.  Excessive (dysfunctional) uterine bleeding. This surgery is performed by inserting a thin, lighted tube (laparoscope) and surgical instruments into small incisions in the abdomen. The laparoscope sends images to a monitor. The images help the health care provider perform the procedure. After this procedure, you will no longer be able to have a baby, and you will no longer have a menstrual period. Tell a health care provider about:  Any allergies you have.  All medicines you are taking, including vitamins, herbs, eye drops, creams, and over-the-counter medicines.  Any problems you or family members have had with anesthetic medicines.  Any blood disorders you have.  Any surgeries you have had.  Any medical conditions you have.  Whether you are pregnant or may be pregnant. What are the risks? Generally, this is a safe procedure. However, problems may occur, including:  Infection.  Bleeding.  Blood clots in the legs or lungs.  Allergic reactions to medicines.  Damage to other structures or organs.  The risk that the surgery may have to be switched to the regular one in which a large incision is made in the abdomen (abdominal hysterectomy). What happens before the procedure? Staying hydrated Follow instructions from your  health care provider about hydration, which may include:  Up to 2 hours before the procedure - you may continue to drink clear liquids, such as water, clear fruit juice, black coffee, and plain tea Eating and drinking restrictions Follow instructions from your health care provider about eating and drinking, which may include:  8 hours before the procedure - stop eating heavy meals or foods such as meat, fried foods, or fatty foods.  6 hours before the procedure - stop eating light meals or foods, such as toast or cereal.  6 hours before the procedure - stop drinking milk or drinks that contain milk.  2 hours before the procedure - stop drinking clear liquids. Medicines  Ask your health care provider about: ? Changing or stopping your regular medicines. This is especially important if you are taking diabetes medicines or blood thinners. ? Taking over-the-counter medicines, vitamins, herbs, and supplements. ? Taking medicines such as aspirin and ibuprofen. These medicines can thin your blood. Do not take these medicines unless your health care provider tells you to take them.  You may be given antibiotic medicine to help prevent infection.  You may be asked to take laxatives.  You may be given medicines to help prevent nausea and vomiting after the procedure. General instructions  Ask your health care provider how your surgical site will be marked or identified.  You may be asked to shower with a germ-killing soap.  Do not use any products that contain nicotine or tobacco, such as cigarettes and e-cigarettes. If you need help quitting, ask your health care provider.  You may have an exam or testing, such as an ultrasound to determine the size and shape of your pelvic organs.    You may have a blood or urine sample taken.  This procedure can affect the way you feel about yourself. Talk with your health care provider about the physical and emotional changes hysterectomy may  cause.  Plan to have someone take you home from the hospital or clinic.  Plan to have a responsible adult care for you for at least 24 hours after you leave the hospital or clinic. This is important. What happens during the procedure?  To lower your risk of infection: ? Your health care team will wash or sanitize their hands. ? Your skin will be washed with soap. ? Hair may be removed from the surgical area.  An IV will be inserted into one of your veins.  You will be given one or more of the following: ? A medicine to help you relax (sedative). ? A medicine to make you fall asleep (general anesthetic).  You will be given antibiotic medicine through your IV.  A tube may be inserted down your throat to help you breathe during the procedure.  A gas (carbon dioxide) will be used to inflate your abdomen to allow your surgeon to see inside of your abdomen.  Three or four small incisions will be made in your abdomen.  A laparoscope will be inserted into one of your incisions. Surgical instruments will be inserted through the other incisions in order to perform the procedure.  Your uterus and cervix may be removed through your vagina or cut into small pieces and removed through the small incisions. Any other organs that need to be removed will also be removed this way.  Carbon dioxide will be released from inside of your abdomen.  Your incisions will be closed with stitches (sutures).  A bandage (dressing) may be placed over your incisions. The procedure may vary among health care providers and hospitals. What happens after the procedure?  Your blood pressure, heart rate, breathing rate, and blood oxygen level will be monitored until the medicines you were given have worn off.  You will be given medicine for pain and nausea as needed.  Do not drive for 24 hours if you received a sedative. Summary  Total Laparoscopic hysterectomy is a procedure to remove your uterus, cervix and  sometimes the fallopian tubes and ovaries.  This procedure can affect the way you feel about yourself. Talk with your health care provider about the physical and emotional changes hysterectomy may cause.  After this procedure, you will no longer be able to have a baby, and you will no longer have a menstrual period.  You will be given pain medicine to control discomfort after this procedure. This information is not intended to replace advice given to you by your health care provider. Make sure you discuss any questions you have with your health care provider. Document Released: 10/03/2007 Document Revised: 11/18/2017 Document Reviewed: 02/16/2017 Elsevier Patient Education  2020 [employer]  

## 2019-07-23 ENCOUNTER — Telehealth: Payer: Self-pay

## 2019-07-23 NOTE — Telephone Encounter (Signed)
Pt calling triage line stating she is waiting to hear back about the surgery she is supposed to be having. Has something been scheduled? CB# [phone]

## 2019-07-23 NOTE — Telephone Encounter (Signed)
[name] will call to arrange soon  Surgery Booking Request Patient Full Name:  Stephanie Jacobs  MRN: [id number]  DOB: 08-04-1966  Surgeon: Hoyt Koch, MD  Requested Surgery Date and Time: august Primary Diagnosis AND Code: Menorrhagia, Fibroids 1. Fibroids  D21.9  2. Menometrorrhagia  N92.1  Surgical Procedure: TLH/BS L&D Notification: No Admission Status: same day surgery Length of Surgery: 60 min Special Case Needs: no H&P: yes (date) Phone Interview???: yes Interpreter: Language:  Medical Clearance: no Special Scheduling Instructions: no Acuity: P3

## 2019-07-25 NOTE — Telephone Encounter (Signed)
Tried to call Pt, phone has a busy signal , every time I call

## 2019-07-25 NOTE — Telephone Encounter (Signed)
Is pt aware?

## 2019-07-26 ENCOUNTER — Other Ambulatory Visit: Payer: Self-pay

## 2019-07-26 ENCOUNTER — Other Ambulatory Visit: Payer: Self-pay | Admitting: Physician Assistant

## 2019-07-26 DIAGNOSIS — I10 Essential (primary) hypertension: Secondary | ICD-10-CM

## 2019-07-26 MED ORDER — HYDROCHLOROTHIAZIDE 25 MG PO TABS: 25.0000 mg | ORAL_TABLET | Freq: Every day | ORAL | 0 refills | Status: DC

## 2019-07-26 MED ORDER — AMLODIPINE BESYLATE 10 MG PO TABS: 10.0000 mg | ORAL_TABLET | Freq: Every day | ORAL | 1 refills | Status: DC

## 2019-07-26 MED ORDER — LOSARTAN POTASSIUM 100 MG PO TABS: 100.0000 mg | ORAL_TABLET | Freq: Every day | ORAL | 1 refills | Status: DC

## 2019-07-26 NOTE — Telephone Encounter (Signed)
This patient has a duplicate chart. I am not sure why, but this chart has none of her medical history in it. Please pull down these refills under her other chart which is populated. I am sending this chart to practice management for reconciliation.

## 2019-07-27 ENCOUNTER — Telehealth: Payer: Self-pay | Admitting: Obstetrics & Gynecology

## 2019-07-27 NOTE — Telephone Encounter (Signed)
-----   Message from Gae Dry, MD sent at 07/18/2019  4:13 PM EDT ----- Regarding: SURGERY Surgery Booking Request Patient Full Name:  Stephanie Jacobs  MRN: [id number]  DOB: Apr 11, 1966  Surgeon: Hoyt Koch, MD  Requested Surgery Date and Time: Aug Primary Diagnosis AND Code: Fibroids, Menometrorrhagia Secondary Diagnosis and Code: D21.9 N92.1  Surgical Procedure: TLH/BS L&D Notification: No Admission Status: same day surgery Length of Surgery: 60 min Special Case Needs: no H&P: yes (date) Phone Interview???: yes Interpreter: Language:  Medical Clearance: no Special Scheduling Instructions: no Acuity: P3

## 2019-07-27 NOTE — Telephone Encounter (Signed)
Patient is aware of H&P on 07/30/19 @ 11:20am w/ Dr. Bevelyn Buckles, Pre-admit testing to be scheduled (patient will watch for notification on MyChart), Covid testing on 08/03/19 @ 8-10:30am, and OR on 08/07/19. Patient is aware she will be asked to quarantine after Covid testing. Patient is aware she may receive calls from the [facility]. Patient confirmed BCBS and no secondary insurance.

## 2019-07-30 ENCOUNTER — Encounter: Payer: Self-pay | Admitting: Obstetrics & Gynecology

## 2019-07-30 ENCOUNTER — Ambulatory Visit (INDEPENDENT_AMBULATORY_CARE_PROVIDER_SITE_OTHER): Payer: BC Managed Care – PPO | Admitting: Obstetrics & Gynecology

## 2019-07-30 ENCOUNTER — Other Ambulatory Visit: Payer: Self-pay

## 2019-07-30 VITALS — BP 150/98 | Ht 65.0 in | Wt 208.0 lb

## 2019-07-30 DIAGNOSIS — N921 Excessive and frequent menstruation with irregular cycle: Secondary | ICD-10-CM | POA: Diagnosis not present

## 2019-07-30 DIAGNOSIS — D219 Benign neoplasm of connective and other soft tissue, unspecified: Secondary | ICD-10-CM | POA: Diagnosis not present

## 2019-07-30 NOTE — H&P (View-Only) (Signed)
PRE-OPERATIVE HISTORY AND PHYSICAL EXAM  HPI:  Stephanie Jacobs is a 53 y.o. G6P4 No LMP recorded.; she is being admitted for surgery related to fibroids.  complains of menometrorrhagia that  began several months ago and its severity is described as severe.  She has had regular periods every 28 days and they are associated with mild menstrual cramping.  However, in March she had a month long period w many heavy days; April and May only spotting; and June again a prolonged period still bleeding today.  Associated w more cramping than before.  She has used the following for attempts at control: maxi pad.  Has soiled clothes and interferes w work.  FH Fibroids and Uterine cancer.  [place]- Fibroid 1:55.6 x 59.1 x 56.8 mm possibly less than 50% submucosal, fundal right Fibroid 2:23.7 x 18.9 x 28.9 mm subserosal posterior left   PMHx: Past Medical History:  Diagnosis Date  . Hypertension   . Sickle cell trait [facility])    Past Surgical History:  Procedure Laterality Date  . BREAST BIOPSY Right 05/02/2018   Affirm Bx- path pending - coil clip  . TUBAL LIGATION  1995   Family History  Problem Relation Age of Onset  . Hypertension Mother   . Diabetes Mother   . Sickle cell anemia Brother   . Sickle cell trait Brother   . Breast cancer Neg Hx    Social History   Tobacco Use  . Smoking status: Current Every Day Smoker    Packs/day: 0.50    Types: Cigarettes  . Smokeless tobacco: Never Used  Substance Use Topics  . Alcohol use: Yes    Alcohol/week: 7.0 standard drinks    Types: 7 Glasses of wine per week    Comment: One glass of red wine in the evenings.  . Drug use: No    Current Outpatient Medications:  .  albuterol (PROVENTIL HFA;VENTOLIN HFA) 108 (90 BASE) MCG/ACT inhaler, Inhale into the lungs., Disp: , Rfl:  .  amLODipine (NORVASC) 10 MG tablet, Take 1 tablet (10 mg total) by mouth daily., Disp: 90 tablet, Rfl: 1 .  aspirin 81 MG tablet, Take by mouth., Disp: , Rfl:  .  diclofenac  sodium (VOLTAREN) 1 % GEL, Apply 4 g topically 4 (four) times daily., Disp: 100 g, Rfl: 0 .  hydrochlorothiazide (HYDRODIURIL) 25 MG tablet, Take 1 tablet (25 mg total) by mouth daily., Disp: 90 tablet, Rfl: 0 .  losartan (COZAAR) 100 MG tablet, Take 1 tablet (100 mg total) by mouth daily., Disp: 90 tablet, Rfl: 1 .  Multiple Vitamin (MULTIVITAMIN) tablet, Take 1 tablet by mouth daily., Disp: , Rfl:  .  medroxyPROGESTERone (PROVERA) 10 MG tablet, Take 1 tablet (10 mg total) by mouth daily for 10 days., Disp: 10 tablet, Rfl: 0 Allergies: Tape  Review of Systems  Constitutional: Negative for chills, fever and malaise/fatigue.  HENT: Negative for congestion, sinus pain and sore throat.   Eyes: Negative for blurred vision and pain.  Respiratory: Negative for cough and wheezing.   Cardiovascular: Negative for chest pain and leg swelling.  Gastrointestinal: Negative for abdominal pain, constipation, diarrhea, heartburn, nausea and vomiting.  Genitourinary: Negative for dysuria, frequency, hematuria and urgency.  Musculoskeletal: Negative for back pain, joint pain, myalgias and neck pain.  Skin: Negative for itching and rash.  Neurological: Negative for dizziness, tremors and weakness.  Endo/Heme/Allergies: Does not bruise/bleed easily.  Psychiatric/Behavioral: Negative for depression. The patient is not nervous/anxious and does not have  insomnia.     Objective: BP (!) 150/98   Ht 5\' 5"  (1.651 m)   Wt 208 lb (94.3 kg)   BMI 34.61 kg/m   Filed Weights   07/30/19 1135  Weight: 208 lb (94.3 kg)   Physical Exam Constitutional:      General: She is not in acute distress.    Appearance: She is well-developed.  HENT:     Head: Normocephalic and atraumatic. No laceration.     Right Ear: Hearing normal.     Left Ear: Hearing normal.     Mouth/Throat:     Pharynx: Uvula midline.  Eyes:     Pupils: Pupils are equal, round, and reactive to light.  Neck:     Musculoskeletal: Normal range of  motion and neck supple.     Thyroid: No thyromegaly.  Cardiovascular:     Rate and Rhythm: Normal rate and regular rhythm.     Heart sounds: No murmur. No friction rub. No gallop.   Pulmonary:     Effort: Pulmonary effort is normal. No respiratory distress.     Breath sounds: Normal breath sounds. No wheezing.  Chest:     Breasts:        Right: No mass, skin change or tenderness.        Left: No mass, skin change or tenderness.  Abdominal:     General: Bowel sounds are normal. There is no distension.     Palpations: Abdomen is soft.     Tenderness: There is no abdominal tenderness. There is no rebound.  Musculoskeletal: Normal range of motion.  Neurological:     Mental Status: She is alert and oriented to person, place, and time.     Cranial Nerves: No cranial nerve deficit.  Skin:    General: Skin is warm and dry.  Psychiatric:        Judgment: Judgment normal.  Vitals signs reviewed.     Assessment: 1. Fibroids   2. Menometrorrhagia   Desire for hysterectomy  I have had a careful discussion with this patient about all the options available and the risk/benefits of each. I have fully informed this patient that surgery may subject her to a variety of discomforts and risks: She understands that most patients have surgery with little difficulty, but problems can happen ranging from minor to fatal. These include nausea, vomiting, pain, bleeding, infection, poor healing, hernia, or formation of adhesions. Unexpected reactions may occur from any drug or anesthetic given. Unintended injury may occur to other pelvic or abdominal structures such as Fallopian tubes, ovaries, bladder, ureter (tube from kidney to bladder), or bowel. Nerves going from the pelvis to the legs may be injured. Any such injury may require immediate or later additional surgery to correct the problem. Excessive blood loss requiring transfusion is very unlikely but possible. Dangerous blood clots may form in the legs or  lungs. Physical and sexual activity will be restricted in varying degrees for an indeterminate period of time but most often 2-6 weeks.  Finally, she understands that it is impossible to list every possible undesirable effect and that the condition for which surgery is done is not always cured or significantly improved, and in rare cases may be even worse.Ample time was given to answer all questions.  Barnett Applebaum, MD, [facility], [facility] 07/30/2019  11:43 AM

## 2019-07-30 NOTE — Progress Notes (Signed)
PRE-OPERATIVE HISTORY AND PHYSICAL EXAM  HPI:  Stephanie Jacobs is a 53 y.o. G6P4 No LMP recorded.; she is being admitted for surgery related to fibroids.  complains of menometrorrhagia that  began several months ago and its severity is described as severe.  She has had regular periods every 28 days and they are associated with mild menstrual cramping.  However, in March she had a month long period w many heavy days; April and May only spotting; and June again a prolonged period still bleeding today.  Associated w more cramping than before.  She has used the following for attempts at control: maxi pad.  Has soiled clothes and interferes w work.  FH Fibroids and Uterine cancer.  [place]- Fibroid 1:55.6 x 59.1 x 56.8 mm possibly less than 50% submucosal, fundal right Fibroid 2:23.7 x 18.9 x 28.9 mm subserosal posterior left   PMHx: Past Medical History:  Diagnosis Date  . Hypertension   . Sickle cell trait [facility])    Past Surgical History:  Procedure Laterality Date  . BREAST BIOPSY Right 05/02/2018   Affirm Bx- path pending - coil clip  . TUBAL LIGATION  1995   Family History  Problem Relation Age of Onset  . Hypertension Mother   . Diabetes Mother   . Sickle cell anemia Brother   . Sickle cell trait Brother   . Breast cancer Neg Hx    Social History   Tobacco Use  . Smoking status: Current Every Day Smoker    Packs/day: 0.50    Types: Cigarettes  . Smokeless tobacco: Never Used  Substance Use Topics  . Alcohol use: Yes    Alcohol/week: 7.0 standard drinks    Types: 7 Glasses of wine per week    Comment: One glass of red wine in the evenings.  . Drug use: No    Current Outpatient Medications:  .  albuterol (PROVENTIL HFA;VENTOLIN HFA) 108 (90 BASE) MCG/ACT inhaler, Inhale into the lungs., Disp: , Rfl:  .  amLODipine (NORVASC) 10 MG tablet, Take 1 tablet (10 mg total) by mouth daily., Disp: 90 tablet, Rfl: 1 .  aspirin 81 MG tablet, Take by mouth., Disp: , Rfl:  .  diclofenac  sodium (VOLTAREN) 1 % GEL, Apply 4 g topically 4 (four) times daily., Disp: 100 g, Rfl: 0 .  hydrochlorothiazide (HYDRODIURIL) 25 MG tablet, Take 1 tablet (25 mg total) by mouth daily., Disp: 90 tablet, Rfl: 0 .  losartan (COZAAR) 100 MG tablet, Take 1 tablet (100 mg total) by mouth daily., Disp: 90 tablet, Rfl: 1 .  Multiple Vitamin (MULTIVITAMIN) tablet, Take 1 tablet by mouth daily., Disp: , Rfl:  .  medroxyPROGESTERone (PROVERA) 10 MG tablet, Take 1 tablet (10 mg total) by mouth daily for 10 days., Disp: 10 tablet, Rfl: 0 Allergies: Tape  Review of Systems  Constitutional: Negative for chills, fever and malaise/fatigue.  HENT: Negative for congestion, sinus pain and sore throat.   Eyes: Negative for blurred vision and pain.  Respiratory: Negative for cough and wheezing.   Cardiovascular: Negative for chest pain and leg swelling.  Gastrointestinal: Negative for abdominal pain, constipation, diarrhea, heartburn, nausea and vomiting.  Genitourinary: Negative for dysuria, frequency, hematuria and urgency.  Musculoskeletal: Negative for back pain, joint pain, myalgias and neck pain.  Skin: Negative for itching and rash.  Neurological: Negative for dizziness, tremors and weakness.  Endo/Heme/Allergies: Does not bruise/bleed easily.  Psychiatric/Behavioral: Negative for depression. The patient is not nervous/anxious and does not have  insomnia.     Objective: BP (!) 150/98   Ht 5\' 5"  (1.651 m)   Wt 208 lb (94.3 kg)   BMI 34.61 kg/m   Filed Weights   07/30/19 1135  Weight: 208 lb (94.3 kg)   Physical Exam Constitutional:      General: She is not in acute distress.    Appearance: She is well-developed.  HENT:     Head: Normocephalic and atraumatic. No laceration.     Right Ear: Hearing normal.     Left Ear: Hearing normal.     Mouth/Throat:     Pharynx: Uvula midline.  Eyes:     Pupils: Pupils are equal, round, and reactive to light.  Neck:     Musculoskeletal: Normal range of  motion and neck supple.     Thyroid: No thyromegaly.  Cardiovascular:     Rate and Rhythm: Normal rate and regular rhythm.     Heart sounds: No murmur. No friction rub. No gallop.   Pulmonary:     Effort: Pulmonary effort is normal. No respiratory distress.     Breath sounds: Normal breath sounds. No wheezing.  Chest:     Breasts:        Right: No mass, skin change or tenderness.        Left: No mass, skin change or tenderness.  Abdominal:     General: Bowel sounds are normal. There is no distension.     Palpations: Abdomen is soft.     Tenderness: There is no abdominal tenderness. There is no rebound.  Musculoskeletal: Normal range of motion.  Neurological:     Mental Status: She is alert and oriented to person, place, and time.     Cranial Nerves: No cranial nerve deficit.  Skin:    General: Skin is warm and dry.  Psychiatric:        Judgment: Judgment normal.  Vitals signs reviewed.     Assessment: 1. Fibroids   2. Menometrorrhagia   Desire for hysterectomy  I have had a careful discussion with this patient about all the options available and the risk/benefits of each. I have fully informed this patient that surgery may subject her to a variety of discomforts and risks: She understands that most patients have surgery with little difficulty, but problems can happen ranging from minor to fatal. These include nausea, vomiting, pain, bleeding, infection, poor healing, hernia, or formation of adhesions. Unexpected reactions may occur from any drug or anesthetic given. Unintended injury may occur to other pelvic or abdominal structures such as Fallopian tubes, ovaries, bladder, ureter (tube from kidney to bladder), or bowel. Nerves going from the pelvis to the legs may be injured. Any such injury may require immediate or later additional surgery to correct the problem. Excessive blood loss requiring transfusion is very unlikely but possible. Dangerous blood clots may form in the legs or  lungs. Physical and sexual activity will be restricted in varying degrees for an indeterminate period of time but most often 2-6 weeks.  Finally, she understands that it is impossible to list every possible undesirable effect and that the condition for which surgery is done is not always cured or significantly improved, and in rare cases may be even worse.Ample time was given to answer all questions.  Barnett Applebaum, MD, [facility], [facility] 07/30/2019  11:43 AM

## 2019-07-30 NOTE — Patient Instructions (Signed)

## 2019-08-02 ENCOUNTER — Other Ambulatory Visit: Payer: Self-pay

## 2019-08-02 ENCOUNTER — Encounter
Admission: RE | Admit: 2019-08-02 | Discharge: 2019-08-02 | Disposition: A | Discharge status: Home / Self Care | Payer: BC Managed Care – PPO | Source: Ambulatory Visit | Attending: Obstetrics & Gynecology | Admitting: Obstetrics & Gynecology

## 2019-08-02 NOTE — Patient Instructions (Signed)
Your procedure is scheduled on: 08/07/19 Report to Day Surgery. [facility]OND FLOOR To find out your arrival time please call [phone] between 1PM - 3PM on 08/06/19.  Remember: Instructions that are not followed completely may result in serious medical risk,  up to and including death, or upon the discretion of your surgeon and anesthesiologist your  surgery may need to be rescheduled.     _X__ 1. Do not eat food after midnight the night before your procedure.                 No gum chewing or hard candies. You may drink clear liquids up to 2 hours                 before you are scheduled to arrive for your surgery- DO not drink clear                 liquids within 2 hours of the start of your surgery.                 Clear Liquids include:  water, apple juice without pulp, clear carbohydrate                 drink such as Clearfast of Gatorade, Black Coffee or Tea (Do not add                 anything to coffee or tea).  __X__2.  On the morning of surgery brush your teeth with toothpaste and water, you                may rinse your mouth with mouthwash if you wish.  Do not swallow any toothpaste of mouthwash.     _X__ 3.  No Alcohol for 24 hours before or after surgery.   _X__ 4.  Do Not Smoke or use e-cigarettes For 24 Hours Prior to Your Surgery.                 Do not use any chewable tobacco products for at least 6 hours prior to                 surgery.  ____  5.  Bring all medications with you on the day of surgery if instructed.   __X__  6.  Notify your doctor if there is any change in your medical condition      (cold, fever, infections).     Do not wear jewelry, make-up, hairpins, clips or nail polish. Do not wear lotions, powders, or perfumes. You may wear deodorant. Do not shave 48 hours prior to surgery. Men may shave face and neck. Do not bring valuables to the hospital.    [facility] is not responsible for any belongings or  valuables.  Contacts, dentures or bridgework may not be worn into surgery. Leave your suitcase in the car. After surgery it may be brought to your room. For patients admitted to the hospital, discharge time is determined by your treatment team.   Patients discharged the day of surgery will not be allowed to drive home.   Please read over the following fact sheets that you were given:   Surgical Site Infection Prevention / SPIROMETRY         __X__ Take these medicines the morning of surgery with A SIP OF WATER:    1. AMLODIPINE  2.   3.   4.  5.  6.  ____ Fleet Enema (as directed)  _X___ Use CHG Soap as directed  ____ Use inhalers on the day of surgery  ____ Stop metformin 2 days prior to surgery    ____ Take 1/2 of usual insulin dose the night before surgery. No insulin the morning          of surgery.   __X__ Stop Coumadin/Plavix/aspirin on  STOP ASPIRIN 08/02/19  ____ Stop Anti-inflammatories on    ____ Stop supplements until after surgery.    ____ Bring C-Pap to the hospital.   PRACTICE WITH SPIROMETRY AND BRING AM OF SURGERY

## 2019-08-03 ENCOUNTER — Encounter
Admission: RE | Admit: 2019-08-03 | Discharge: 2019-08-03 | Disposition: A | Discharge status: Home / Self Care | Payer: BC Managed Care – PPO | Source: Ambulatory Visit | Attending: Obstetrics & Gynecology | Admitting: Obstetrics & Gynecology

## 2019-08-03 ENCOUNTER — Other Ambulatory Visit: Payer: Self-pay

## 2019-08-03 DIAGNOSIS — Z20828 Contact with and (suspected) exposure to other viral communicable diseases: Secondary | ICD-10-CM | POA: Insufficient documentation

## 2019-08-03 DIAGNOSIS — Z01818 Encounter for other preprocedural examination: Secondary | ICD-10-CM | POA: Insufficient documentation

## 2019-08-03 LAB — BASIC METABOLIC PANEL
Anion gap: 13 (ref 5–15)
BUN: 9 mg/dL (ref 6–20)
CO2: 24 mmol/L (ref 22–32)
Calcium: 9.2 mg/dL (ref 8.9–10.3)
Chloride: 98 mmol/L (ref 98–111)
Creatinine, Ser: 0.49 mg/dL (ref 0.44–1.00)
GFR calc Af Amer: >60 mL/min (ref >60)
GFR calc non Af Amer: >60 mL/min (ref >60)
Glucose, Bld: 89 mg/dL (ref 70–99)
Potassium: 3.5 mmol/L (ref 3.5–5.1)
Sodium: 135 mmol/L (ref 135–145)

## 2019-08-03 LAB — TYPE AND SCREEN
ABO/RH(D): A POS
Antibody Screen: NEGATIVE

## 2019-08-03 LAB — CBC
HCT: 43.3 % (ref 36.0–46.0)
Hemoglobin: 15 g/dL (ref 12.0–15.0)
MCH: 30.2 pg (ref 26.0–34.0)
MCHC: 34.6 g/dL (ref 30.0–36.0)
MCV: 87.1 fL (ref 80.0–100.0)
Platelets: 334 10*3/uL (ref 150–400)
RBC: 4.97 MIL/uL (ref 3.87–5.11)
RDW: 14.2 % (ref 11.5–15.5)
WBC: 8.5 10*3/uL (ref 4.0–10.5)
nRBC: 0 % (ref 0.0–0.2)

## 2019-08-03 LAB — SARS CORONAVIRUS 2 (TAT 6-24 HRS): SARS Coronavirus 2: NEGATIVE

## 2019-08-06 MED ORDER — SODIUM CHLORIDE 0.9 % IV SOLN
2.0000 g | INTRAVENOUS | Status: AC
  Administered 2019-08-07: 12:00:00 2 g via INTRAVENOUS

## 2019-08-07 ENCOUNTER — Other Ambulatory Visit: Payer: Self-pay

## 2019-08-07 ENCOUNTER — Telehealth: Payer: Self-pay

## 2019-08-07 ENCOUNTER — Ambulatory Visit
Admission: RE | Admit: 2019-08-07 | Discharge: 2019-08-07 | Disposition: A | Discharge status: Home / Self Care | Payer: BC Managed Care – PPO | Attending: Obstetrics & Gynecology | Admitting: Obstetrics & Gynecology

## 2019-08-07 ENCOUNTER — Ambulatory Visit: Payer: BC Managed Care – PPO | Admitting: Anesthesiology

## 2019-08-07 ENCOUNTER — Encounter: Payer: Self-pay | Admitting: *Deleted

## 2019-08-07 ENCOUNTER — Encounter
Admission: RE | Disposition: A | Discharge status: Home / Self Care | Payer: Self-pay | Source: Home / Self Care | Attending: Obstetrics & Gynecology

## 2019-08-07 DIAGNOSIS — N921 Excessive and frequent menstruation with irregular cycle: Secondary | ICD-10-CM | POA: Diagnosis present

## 2019-08-07 DIAGNOSIS — Z7982 Long term (current) use of aspirin: Secondary | ICD-10-CM | POA: Diagnosis not present

## 2019-08-07 DIAGNOSIS — F17210 Nicotine dependence, cigarettes, uncomplicated: Secondary | ICD-10-CM | POA: Insufficient documentation

## 2019-08-07 DIAGNOSIS — D252 Subserosal leiomyoma of uterus: Secondary | ICD-10-CM | POA: Diagnosis not present

## 2019-08-07 DIAGNOSIS — D219 Benign neoplasm of connective and other soft tissue, unspecified: Secondary | ICD-10-CM | POA: Diagnosis not present

## 2019-08-07 DIAGNOSIS — D573 Sickle-cell trait: Secondary | ICD-10-CM | POA: Insufficient documentation

## 2019-08-07 DIAGNOSIS — Z6834 Body mass index (BMI) 34.0-34.9, adult: Secondary | ICD-10-CM | POA: Insufficient documentation

## 2019-08-07 DIAGNOSIS — E669 Obesity, unspecified: Secondary | ICD-10-CM | POA: Insufficient documentation

## 2019-08-07 DIAGNOSIS — I10 Essential (primary) hypertension: Secondary | ICD-10-CM | POA: Insufficient documentation

## 2019-08-07 DIAGNOSIS — Z79899 Other long term (current) drug therapy: Secondary | ICD-10-CM | POA: Insufficient documentation

## 2019-08-07 DIAGNOSIS — Z791 Long term (current) use of non-steroidal anti-inflammatories (NSAID): Secondary | ICD-10-CM | POA: Insufficient documentation

## 2019-08-07 DIAGNOSIS — N800 Endometriosis of uterus: Secondary | ICD-10-CM | POA: Insufficient documentation

## 2019-08-07 DIAGNOSIS — D259 Leiomyoma of uterus, unspecified: Secondary | ICD-10-CM | POA: Diagnosis not present

## 2019-08-07 HISTORY — PX: TOTAL LAPAROSCOPIC HYSTERECTOMY WITH BILATERAL SALPINGO OOPHORECTOMY: SHX6845

## 2019-08-07 HISTORY — PX: CYSTOSCOPY: SHX5120

## 2019-08-07 LAB — ABO/RH: ABO/RH(D): A POS

## 2019-08-07 LAB — POCT PREGNANCY, URINE: Preg Test, Ur: NEGATIVE

## 2019-08-07 SURGERY — HYSTERECTOMY, TOTAL, LAPAROSCOPIC, WITH BILATERAL SALPINGO-OOPHORECTOMY
Anesthesia: General

## 2019-08-07 MED ORDER — FENTANYL CITRATE (PF) 100 MCG/2ML IJ SOLN
25.0000 ug | INTRAMUSCULAR | Status: AC | PRN
  Administered 2019-08-07 (×6): 25 ug via INTRAVENOUS

## 2019-08-07 MED ORDER — ACETAMINOPHEN 325 MG PO TABS: 650.0000 mg | ORAL_TABLET | ORAL | Status: DC | PRN

## 2019-08-07 MED ORDER — LACTATED RINGERS IV SOLN
INTRAVENOUS | Status: DC
  Administered 2019-08-07 (×2): via INTRAVENOUS

## 2019-08-07 MED ORDER — PROMETHAZINE HCL 25 MG/ML IJ SOLN
INTRAMUSCULAR | Status: AC
  Filled 2019-08-07: qty 1

## 2019-08-07 MED ORDER — PROPOFOL 10 MG/ML IV BOLUS
INTRAVENOUS | Status: AC
  Filled 2019-08-07: qty 20

## 2019-08-07 MED ORDER — ONDANSETRON HCL 4 MG/2ML IJ SOLN
INTRAMUSCULAR | Status: DC | PRN
  Administered 2019-08-07: 4 mg via INTRAVENOUS

## 2019-08-07 MED ORDER — FENTANYL CITRATE (PF) 100 MCG/2ML IJ SOLN: INTRAMUSCULAR | Status: DC | PRN

## 2019-08-07 MED ORDER — FENTANYL CITRATE (PF) 100 MCG/2ML IJ SOLN
INTRAMUSCULAR | Status: AC
  Administered 2019-08-07: 25 ug via INTRAVENOUS
  Filled 2019-08-07: qty 2

## 2019-08-07 MED ORDER — MORPHINE SULFATE (PF) 4 MG/ML IV SOLN: 1.0000 mg | INTRAVENOUS | Status: DC | PRN

## 2019-08-07 MED ORDER — FENTANYL CITRATE (PF) 250 MCG/5ML IJ SOLN
INTRAMUSCULAR | Status: DC | PRN
  Administered 2019-08-07: 100 ug via INTRAVENOUS
  Administered 2019-08-07: 50 ug via INTRAVENOUS

## 2019-08-07 MED ORDER — KETOROLAC TROMETHAMINE 30 MG/ML IJ SOLN
30.0000 mg | Freq: Four times a day (QID) | INTRAMUSCULAR | Status: DC
  Administered 2019-08-07: 30 mg via INTRAVENOUS

## 2019-08-07 MED ORDER — ROCURONIUM BROMIDE 50 MG/5ML IV SOLN
INTRAVENOUS | Status: AC
  Filled 2019-08-07: qty 1

## 2019-08-07 MED ORDER — BUPIVACAINE HCL (PF) 0.5 % IJ SOLN
INTRAMUSCULAR | Status: DC | PRN
  Administered 2019-08-07: 10 mL

## 2019-08-07 MED ORDER — ROCURONIUM BROMIDE 100 MG/10ML IV SOLN
INTRAVENOUS | Status: DC | PRN
  Administered 2019-08-07: 10 mg via INTRAVENOUS
  Administered 2019-08-07: 50 mg via INTRAVENOUS

## 2019-08-07 MED ORDER — LIDOCAINE HCL (PF) 2 % IJ SOLN
INTRAMUSCULAR | Status: AC
  Filled 2019-08-07: qty 10

## 2019-08-07 MED ORDER — SODIUM CHLORIDE 0.9 % IV SOLN
INTRAVENOUS | Status: AC
  Filled 2019-08-07: qty 2

## 2019-08-07 MED ORDER — KETOROLAC TROMETHAMINE 30 MG/ML IJ SOLN
INTRAMUSCULAR | Status: AC
  Filled 2019-08-07: qty 1

## 2019-08-07 MED ORDER — SODIUM CHLORIDE (PF) 0.9 % IJ SOLN
INTRAMUSCULAR | Status: AC
  Filled 2019-08-07: qty 10

## 2019-08-07 MED ORDER — MIDAZOLAM HCL 2 MG/2ML IJ SOLN
INTRAMUSCULAR | Status: AC
  Filled 2019-08-07: qty 2

## 2019-08-07 MED ORDER — PROPOFOL 10 MG/ML IV BOLUS
INTRAVENOUS | Status: DC | PRN
  Administered 2019-08-07: 120 mg via INTRAVENOUS
  Administered 2019-08-07: 50 mg via INTRAVENOUS

## 2019-08-07 MED ORDER — OXYCODONE HCL 5 MG PO TABS
ORAL_TABLET | ORAL | Status: AC
  Administered 2019-08-07: 5 mg via ORAL
  Filled 2019-08-07: qty 1

## 2019-08-07 MED ORDER — PROMETHAZINE HCL 25 MG/ML IJ SOLN
6.2500 mg | INTRAMUSCULAR | Status: DC | PRN
  Administered 2019-08-07: 6.25 mg via INTRAVENOUS

## 2019-08-07 MED ORDER — ALBUTEROL SULFATE HFA 108 (90 BASE) MCG/ACT IN AERS
INHALATION_SPRAY | RESPIRATORY_TRACT | Status: DC | PRN
  Administered 2019-08-07: 3 via RESPIRATORY_TRACT

## 2019-08-07 MED ORDER — SUGAMMADEX SODIUM 200 MG/2ML IV SOLN
INTRAVENOUS | Status: DC | PRN
  Administered 2019-08-07: 188.6 mg via INTRAVENOUS

## 2019-08-07 MED ORDER — FAMOTIDINE 20 MG PO TABS
ORAL_TABLET | ORAL | Status: AC
  Administered 2019-08-07: 20 mg via ORAL
  Filled 2019-08-07: qty 1

## 2019-08-07 MED ORDER — OXYCODONE HCL 5 MG/5ML PO SOLN: 5.0000 mg | Freq: Once | ORAL | Status: AC | PRN

## 2019-08-07 MED ORDER — SUGAMMADEX SODIUM 200 MG/2ML IV SOLN
INTRAVENOUS | Status: AC
  Filled 2019-08-07: qty 2

## 2019-08-07 MED ORDER — OXYCODONE-ACETAMINOPHEN 5-325 MG PO TABS: 1.0000 | ORAL_TABLET | ORAL | Status: DC | PRN

## 2019-08-07 MED ORDER — FENTANYL CITRATE (PF) 250 MCG/5ML IJ SOLN
INTRAMUSCULAR | Status: AC
  Filled 2019-08-07: qty 5

## 2019-08-07 MED ORDER — FAMOTIDINE 20 MG PO TABS
20.0000 mg | ORAL_TABLET | Freq: Once | ORAL | Status: AC
  Administered 2019-08-07: 20 mg via ORAL

## 2019-08-07 MED ORDER — LIDOCAINE HCL (CARDIAC) PF 100 MG/5ML IV SOSY
PREFILLED_SYRINGE | INTRAVENOUS | Status: DC | PRN
  Administered 2019-08-07: 70 mg via INTRAVENOUS

## 2019-08-07 MED ORDER — ACETAMINOPHEN 650 MG RE SUPP
650.0000 mg | RECTAL | Status: DC | PRN
  Filled 2019-08-07: qty 1

## 2019-08-07 MED ORDER — LACTATED RINGERS IV SOLN
INTRAVENOUS | Status: DC
  Administered 2019-08-07: 10:00:00 via INTRAVENOUS

## 2019-08-07 MED ORDER — MEPERIDINE HCL 50 MG/ML IJ SOLN: 6.2500 mg | INTRAMUSCULAR | Status: DC | PRN

## 2019-08-07 MED ORDER — MIDAZOLAM HCL 2 MG/2ML IJ SOLN
INTRAMUSCULAR | Status: DC | PRN
  Administered 2019-08-07: 2 mg via INTRAVENOUS

## 2019-08-07 MED ORDER — OXYCODONE-ACETAMINOPHEN 5-325 MG PO TABS: 1.0000 | ORAL_TABLET | ORAL | 0 refills | Status: DC | PRN

## 2019-08-07 MED ORDER — DEXAMETHASONE SODIUM PHOSPHATE 10 MG/ML IJ SOLN
INTRAMUSCULAR | Status: DC | PRN
  Administered 2019-08-07: 10 mg via INTRAVENOUS

## 2019-08-07 MED ORDER — LACTATED RINGERS IV SOLN: INTRAVENOUS | Status: DC

## 2019-08-07 MED ORDER — OXYCODONE HCL 5 MG PO TABS
5.0000 mg | ORAL_TABLET | Freq: Once | ORAL | Status: AC | PRN
  Administered 2019-08-07: 15:00:00 5 mg via ORAL

## 2019-08-07 SURGICAL SUPPLY — 58 items
ADH SKN CLS APL DERMABOND .7 (GAUZE/BANDAGES/DRESSINGS) ×2
APL PRP STRL LF DISP 70% ISPRP (MISCELLANEOUS) ×2
APL SRG 38 LTWT LNG FL B (MISCELLANEOUS) ×2
APPLICATOR ARISTA FLEXITIP XL (MISCELLANEOUS) ×1 IMPLANT
BAG URINE DRAINAGE (UROLOGICAL SUPPLIES) ×3 IMPLANT
BLADE SURG SZ11 CARB STEEL (BLADE) ×3 IMPLANT
CANISTER SUCT 1200ML W/VALVE (MISCELLANEOUS) ×3 IMPLANT
CATH FOLEY 2WAY  5CC 16FR (CATHETERS) ×1
CATH FOLEY 2WAY 5CC 16FR (CATHETERS) ×2
CATH URTH 16FR FL 2W BLN LF (CATHETERS) ×2 IMPLANT
CHLORAPREP W/TINT 26 (MISCELLANEOUS) ×3 IMPLANT
COVER LIGHT HANDLE STERIS (MISCELLANEOUS) ×1 IMPLANT
COVER WAND RF STERILE (DRAPES) ×3 IMPLANT
DEFOGGER SCOPE WARMER CLEARIFY (MISCELLANEOUS) ×3 IMPLANT
DERMABOND ADVANCED (GAUZE/BANDAGES/DRESSINGS) ×1
DERMABOND ADVANCED .7 DNX12 (GAUZE/BANDAGES/DRESSINGS) ×2 IMPLANT
DEVICE SUTURE ENDOST 10MM (ENDOMECHANICALS) ×1 IMPLANT
DRAPE CAMERA CLOSED 9X96 (DRAPES) ×3 IMPLANT
DRSG TEGADERM 2-3/8X2-3/4 SM (GAUZE/BANDAGES/DRESSINGS) IMPLANT
ENDOSTITCH 0 SINGLE 48 (SUTURE) ×1 IMPLANT
GLOVE BIO SURGEON STRL SZ8 (GLOVE) ×15 IMPLANT
GLOVE INDICATOR 8.0 STRL GRN (GLOVE) ×3 IMPLANT
GOWN STRL REUS W/ TWL LRG LVL3 (GOWN DISPOSABLE) ×2 IMPLANT
GOWN STRL REUS W/ TWL XL LVL3 (GOWN DISPOSABLE) ×4 IMPLANT
GOWN STRL REUS W/TWL LRG LVL3 (GOWN DISPOSABLE) ×3
GOWN STRL REUS W/TWL XL LVL3 (GOWN DISPOSABLE) ×6
GRASPER SUT TROCAR 14GX15 (MISCELLANEOUS) ×3 IMPLANT
HEMOSTAT ARISTA ABSORB 3G PWDR (HEMOSTASIS) ×1 IMPLANT
IRRIGATION STRYKERFLOW (MISCELLANEOUS) ×2 IMPLANT
IRRIGATOR STRYKERFLOW (MISCELLANEOUS) ×3
IV LACTATED RINGERS 1000ML (IV SOLUTION) ×6 IMPLANT
KIT PINK PAD W/HEAD ARE REST (MISCELLANEOUS) ×3
KIT PINK PAD W/HEAD ARM REST (MISCELLANEOUS) ×2 IMPLANT
KIT TURNOVER CYSTO (KITS) ×3 IMPLANT
LABEL OR SOLS (LABEL) ×3 IMPLANT
MANIPULATOR VCARE LG CRV RETR (MISCELLANEOUS) IMPLANT
MANIPULATOR VCARE SML CRV RETR (MISCELLANEOUS) ×1 IMPLANT
MANIPULATOR VCARE STD CRV RETR (MISCELLANEOUS) IMPLANT
NEEDLE VERESS 14GA 120MM (NEEDLE) ×3 IMPLANT
NS IRRIG 500ML POUR BTL (IV SOLUTION) ×3 IMPLANT
OCCLUDER COLPOPNEUMO (BALLOONS) ×3 IMPLANT
PACK GYN LAPAROSCOPIC (MISCELLANEOUS) ×3 IMPLANT
PAD OB MATERNITY 4.3X12.25 (PERSONAL CARE ITEMS) ×3 IMPLANT
PAD PREP 24X41 OB/GYN DISP (PERSONAL CARE ITEMS) ×3 IMPLANT
PORT ACCESS TROCAR AIRSEAL 12 (TROCAR) ×2 IMPLANT
PORT ACCESS TROCAR AIRSEAL 5M (TROCAR) ×1
SCISSORS METZENBAUM CVD 33 (INSTRUMENTS) ×1 IMPLANT
SET CYSTO W/LG BORE CLAMP LF (SET/KITS/TRAYS/PACK) ×3 IMPLANT
SET TRI-LUMEN FLTR TB AIRSEAL (TUBING) ×3 IMPLANT
SHEARS HARMONIC ACE PLUS 36CM (ENDOMECHANICALS) ×3 IMPLANT
SLEEVE ENDOPATH XCEL 5M (ENDOMECHANICALS) ×3 IMPLANT
SPONGE GAUZE 2X2 8PLY STRL LF (GAUZE/BANDAGES/DRESSINGS) IMPLANT
SUT ENDO VLOC 180-0-8IN (SUTURE) ×1 IMPLANT
SUT VIC AB 0 CT1 36 (SUTURE) ×3 IMPLANT
SUT VIC AB 4-0 FS2 27 (SUTURE) ×3 IMPLANT
SYR 10ML LL (SYRINGE) ×3 IMPLANT
SYR 50ML LL SCALE MARK (SYRINGE) ×3 IMPLANT
TROCAR XCEL NON-BLD 5MMX100MML (ENDOMECHANICALS) ×3 IMPLANT

## 2019-08-07 NOTE — Interval H&P Note (Signed)
History and Physical Interval Note:  08/07/2019 10:54 AM  Stephanie Jacobs  has presented today for surgery, with the diagnosis of FIBROIDS and MENOMETRORRHAGIA.  The various methods of treatment have been discussed with the patient and family. After consideration of risks, benefits and other options for treatment, the patient has consented to  Procedure(s): TOTAL LAPAROSCOPIC HYSTERECTOMY WITH BILATERAL SALPINGECTOMY and CYSTOSCOPY as a surgical intervention.  The patient's history has been reviewed, patient examined, no change in status, stable for surgery.  I have reviewed the patient's chart and labs.  Questions were answered to the patient's satisfaction.  Preservation of ovaries discussed, pros and cons.   Hoyt Koch

## 2019-08-07 NOTE — Anesthesia Preprocedure Evaluation (Signed)
Anesthesia Evaluation  Patient identified by MRN, date of birth, ID band Patient awake    Reviewed: Allergy & Precautions, NPO status , Patient's Chart, lab work & pertinent test results  History of Anesthesia Complications Negative for: history of anesthetic complications  Airway Mallampati: II  TM Distance: >3 FB Neck ROM: Full    Dental  (+) Poor Dentition   Pulmonary sleep apnea (mild, did not get CPAP because it was too expensive) , neg COPD, Current SmokerPatient did not abstain from smoking.,    breath sounds clear to auscultation- rhonchi (-) wheezing      Cardiovascular hypertension, Pt. on medications (-) CAD, (-) Past MI, (-) Cardiac Stents and (-) CABG  Rhythm:Regular Rate:Normal - Systolic murmurs and - Diastolic murmurs    Neuro/Psych neg Seizures PSYCHIATRIC DISORDERS Anxiety negative neurological ROS     GI/Hepatic negative GI ROS, Neg liver ROS,   Endo/Other  negative endocrine ROSneg diabetes  Renal/GU negative Renal ROS     Musculoskeletal negative musculoskeletal ROS (+)   Abdominal (+) + obese,   Peds  Hematology negative hematology ROS (+)   Anesthesia Other Findings Past Medical History: No date: Hypertension No date: Sickle cell trait ([place])'  Reproductive/Obstetrics                             Anesthesia Physical Anesthesia Plan  ASA: II  Anesthesia Plan: General   Post-op Pain Management:    Induction: Intravenous  PONV Risk Score and Plan: 1 and Ondansetron and Midazolam  Airway Management Planned: Oral ETT  Additional Equipment:   Intra-op Plan:   Post-operative Plan: Extubation in OR  Informed Consent: I have reviewed the patients History and Physical, chart, labs and discussed the procedure including the risks, benefits and alternatives for the proposed anesthesia with the patient or authorized representative who has indicated his/her  understanding and acceptance.     Dental advisory given  Plan Discussed with: CRNA and Anesthesiologist  Anesthesia Plan Comments:         Anesthesia Quick Evaluation

## 2019-08-07 NOTE — Anesthesia Post-op Follow-up Note (Signed)
Anesthesia QCDR form completed.        

## 2019-08-07 NOTE — Anesthesia Postprocedure Evaluation (Signed)
Anesthesia Post Note  Patient: Stephanie Jacobs  Procedure(s) Performed: TOTAL LAPAROSCOPIC HYSTERECTOMY WITH BILATERAL SALPINGO (N/A ) CYSTOSCOPY  Patient location during evaluation: PACU Anesthesia Type: General Level of consciousness: awake and alert Pain management: pain level controlled Vital Signs Assessment: post-procedure vital signs reviewed and stable Respiratory status: spontaneous breathing, nonlabored ventilation, respiratory function stable and patient connected to nasal cannula oxygen Cardiovascular status: blood pressure returned to baseline and stable Postop Assessment: no apparent nausea or vomiting Anesthetic complications: no     Last Vitals:  Vitals:   08/07/19 1441 08/07/19 1453  BP: (!) 153/89 (!) 161/86  Pulse: 74 74  Resp:  16  Temp:  36.6 C  SpO2: 100% 99%    Last Pain:  Vitals:   08/07/19 1453  TempSrc: Temporal  PainSc: Anton

## 2019-08-07 NOTE — Discharge Instructions (Signed)
Total Laparoscopic Hysterectomy, Care After °This sheet gives you information about how to care for yourself after your procedure. Your health care provider may also give you more specific instructions. If you have problems or questions, contact your health care provider. °What can I expect after the procedure? °After the procedure, it is common to have: °· Pain and bruising around your incisions. °· A sore throat, if a breathing tube was used during surgery. °· Fatigue. °· Poor appetite. °· Less interest in sex. °If your ovaries were also removed, it is also common to have symptoms of menopause such as hot flashes, night sweats, and lack of sleep (insomnia). °Follow these instructions at home: °Bathing °· Do not take baths, swim, or use a hot tub until your health care provider approves. You may need to only take showers for 2-3 weeks. °· Keep your bandage (dressing) dry until your health care provider says it can be removed. °Incision care ° °· Follow instructions from your health care provider about how to take care of your incisions. Make sure you: °? Wash your hands with soap and water before you change your dressing. If soap and water are not available, use hand sanitizer. °? Change your dressing as told by your health care provider. °? Leave stitches (sutures), skin glue, or adhesive strips in place. These skin closures may need to stay in place for 2 weeks or longer. If adhesive strip edges start to loosen and curl up, you may trim the loose edges. Do not remove adhesive strips completely unless your health care provider tells you to do that. °· Check your incision area every day for signs of infection. Check for: °? Redness, swelling, or pain. °? Fluid or blood. °? Warmth. °? Pus or a bad smell. °Activity °· Get plenty of rest and sleep. °· Do not lift anything that is heavier than 10 lbs (4.5 kg) for one month after surgery, or as long as told by your health care provider. °· Do not drive or use heavy  machinery while taking prescription pain medicine. °· Do not drive for 24 hours if you were given a medicine to help you relax (sedative). °· Return to your normal activities as told by your health care provider. Ask your health care provider what activities are safe for you. °Lifestyle ° °· Do not use any products that contain nicotine or tobacco, such as cigarettes and e-cigarettes. These can delay healing. If you need help quitting, ask your health care provider. °· Do not drink alcohol until your health care provider approves. °General instructions °· Do not douche, use tampons, or have sex for at least 6 weeks, or as told by your health care provider. °· Take over-the-counter and prescription medicines only as told by your health care provider. °· To monitor yourself for a fever, take your temperature at least once a day during recovery. °· If you struggle with physical or emotional changes after your procedure, speak with your health care provider or a therapist. °· To prevent or treat constipation while you are taking prescription pain medicine, your health care provider may recommend that you: °? Drink enough fluid to keep your urine clear or pale yellow. °? Take over-the-counter or prescription medicines. °? Eat foods that are high in fiber, such as fresh fruits and vegetables, whole grains, and beans. °? Limit foods that are high in fat and processed sugars, such as fried and sweet foods. °· Keep all follow-up visits as told by your health care provider.   This is important. Contact a health care provider if:  You have chills or a fever.  You have redness, swelling, or pain around an incision.  You have fluid or blood coming from an incision.  Your incision feels warm to the touch.  You have pus or a bad smell coming from an incision.  An incision breaks open.  You feel dizzy or light-headed.  You have pain or bleeding when you urinate.  You have diarrhea, nausea, or vomiting that does not  go away.  You have abnormal vaginal discharge.  You have a rash.  You have pain that does not get better with medicine. Get help right away if:  You have a fever and your symptoms suddenly get worse.  You have severe abdominal pain.  You have chest pain.  You have shortness of breath.  You faint.  You have pain, swelling, or redness on your leg.  You have heavy vaginal bleeding with blood clots. Summary  After the procedure it is common to have abdominal pain. Your provider will give you medication for this.  Do not take baths, swim, or use a hot tub until your health care provider approves.  Do not lift anything that is heavier than 10 lbs (4.5 kg) for one month after surgery, or as long as told by your health care provider.  Notify your provider if you have any signs or symptoms of infection after the procedure. This information is not intended to replace advice given to you by your health care provider. Make sure you discuss any questions you have with your health care provider. Document Released: 09/26/2013 Document Revised: 11/18/2017 Document Reviewed: 02/16/2017 Elsevier Patient Education  2020 [place]   1) The drugs that you were given will stay in your system until tomorrow so for the next 24 hours you should not:  A) Drive an automobile B) Make any legal decisions C) Drink any alcoholic beverage   2) You may resume regular meals tomorrow.  Today it is better to start with liquids and gradually work up to solid foods.  You may eat anything you prefer, but it is better to start with liquids, then soup and crackers, and gradually work up to solid foods.   3) Please notify your doctor immediately if you have any unusual bleeding, trouble breathing, redness and pain at the surgery site, drainage, fever, or pain not relieved by medication.    4) Additional Instructions:        Please contact your  physician with any problems or Same Day Surgery at [phone], Monday through Friday 6 am to 4 pm, or [place] at [facility] number at [phone].

## 2019-08-07 NOTE — Anesthesia Procedure Notes (Signed)
Procedure Name: Intubation Date/Time: 08/07/2019 11:40 AM Performed by: Jerrye Noble, CRNA Pre-anesthesia Checklist: Patient identified, Emergency Drugs available, Suction available, Patient being monitored and Timeout performed Patient Re-evaluated:Patient Re-evaluated prior to induction Oxygen Delivery Method: Circle system utilized Preoxygenation: Pre-oxygenation with 100% oxygen Induction Type: IV induction Ventilation: Mask ventilation without difficulty Laryngoscope Size: Mac and 3 Grade View: Grade I Tube type: Oral Tube size: 7.0 mm Number of attempts: 1 Airway Equipment and Method: Stylet Placement Confirmation: ETT inserted through vocal cords under direct vision,  positive ETCO2 and breath sounds checked- equal and bilateral Secured at: 21 cm Tube secured with: Tape Dental Injury: Teeth and Oropharynx as per pre-operative assessment

## 2019-08-07 NOTE — Telephone Encounter (Signed)
FMLA/DISABILITY form for [employer] filled out, signature obtained and given to KT for processing.

## 2019-08-07 NOTE — Op Note (Signed)
Operative Report:  PRE-OP DIAGNOSIS: FIBROIDS MENOMETRORRHAGIA   POST-OP DIAGNOSIS: FIBROIDS MENOMETRORRHAGIA   PROCEDURE: Procedure(s): TOTAL LAPAROSCOPIC HYSTERECTOMY WITH BILATERAL SALPINGECTOMY CYSTOSCOPY  SURGEON: Barnett Applebaum, MD, FACOG  ASSISTANT: Dr Georgianne Fick, No other capable assistant available, in surgery requiring high level assistant.  ANESTHESIA: General endotracheal anesthesia  ESTIMATED BLOOD LOSS: less than 100   SPECIMENS: Uterus, Tubes.  COMPLICATIONS: None  DISPOSITION: stable to PACU  FINDINGS: Intraabdominal adhesions were not noted. Large fibroids noted. Normal ovaries.  PROCEDURE:  The patient was taken to the OR where anesthesia was administed. She was prepped and draped in the normal sterile fashion in the dorsal lithotomy position in the [place] stirrups. A time out was performed. A Graves speculum was inserted, the cervix was grasped with a single tooth tenaculum and the endometrial cavity was sounded. The cervix was progressively dilated to a size 18 [place] with [employer] dilators. A V-Care uterine manipulator was inserted in the usual fashion without incident. Gloves were changed and attention was turned to the abdomen.   An infraumbilical transverse 4mm skin incision was made with the scalpel after local anesthesia applied to the skin. A Veress-step needle was inserted in the usual fashion and confirmed using the hanging drop technique. A pneumoperitoneum was obtained by insufflation of CO2 (opening pressure of 31mmHg) to 9mmHg. A diagnostic laparoscopy was performed yielding the previously described findings. Attention was turned to the left lower quadrant where after visualization of the inferior epigastric vessels a 21mm skin incision was made with the scalpel. A 5 mm laparoscopic port was inserted. The same procedure was repeated in the right lower quadrant with a 39mm trocar. Attention was turned to the left aspect of the uterus, where after visualization of  the ureter, the round ligament was coagulated and transected using the 39mm Harmonic Scapel. The anterior and posterior leafs of the broad ligament were dissected off as the anterior one was coagulated and transected in a caudal direction towards the cuff of the uterine manipulator.  Attention was then turned to the left fallopian tube which was recognized by visualization of the fimbria. The tube is excised to its attachment to the uterus. The uterine-ovarian ligament and its blood vessels were carefully coagulated and transected using the Harmonic scapel.  Attention was turned to the right aspect of the uterus where the same procedure was performed.  The vesicouterine reflection of the peritoneum was dissected with the harmonic scapel and the bladder flap was created bluntly.  The uterine vessels were coagulated and transected bilaterally using first bipolar cautery and then the harmonic scapel. A 360 degree, circumferential colpotomy was done to completely amputate the uterus with cervix and tubes. Once the specimen was amputated it was delivered through the vagina.   The colpotomy was repaired in a simple running fashion using a delayed absorbable suture with an endo-stitch device.  Vaginal exam confirms complete closure.  The cavity was copiously irrigated. A survey of the pelvic cavity revealed adequate hemostasis and no injury to bowel, bladder, or ureter.   A diagnostic cystoscopy was performed using saline distension of bladder with no lesions or injuries noted.  Bilateral urine flow from each ureteral orifice is visualized.  At this point the procedure was finalized. Right lower quadrant fascia incision is closed with a vicryl suture using the fascia closure device. All the instruments were removed from the patient's body. Gas was expelled and patient is leveled.  Incisions are closed with skin adhesive.    Patient goes to recovery room in  stable condition.  All sponge, instrument, and needle counts  are correct x2.     Barnett Applebaum, MD, [facility], [facility] 08/07/2019  1:43 PM

## 2019-08-07 NOTE — Anesthesia Post-op Follow-up Note (Deleted)
Anesthesia QCDR form completed.        

## 2019-08-07 NOTE — Transfer of Care (Signed)
Immediate Anesthesia Transfer of Care Note  Patient: Stephanie Jacobs  Procedure(s) Performed: TOTAL LAPAROSCOPIC HYSTERECTOMY WITH BILATERAL SALPINGO (N/A )  Patient Location: PACU  Anesthesia Type:General  Level of Consciousness: drowsy  Airway & Oxygen Therapy: Patient Spontanous Breathing and Patient connected to nasal cannula oxygen  Post-op Assessment: Report given to RN and Post -op Vital signs reviewed and stable  Post vital signs: stable  Last Vitals:  Vitals Value Taken Time  BP    Temp    Pulse    Resp 19 08/07/19 1355  SpO2    Vitals shown include unvalidated device data.  Last Pain:  Vitals:   08/07/19 1018  TempSrc: Temporal  PainSc: 0-No pain         Complications: No apparent anesthesia complications

## 2019-08-08 ENCOUNTER — Encounter: Payer: BC Managed Care – PPO | Admitting: Obstetrics & Gynecology

## 2019-08-09 LAB — SURGICAL PATHOLOGY

## 2019-08-10 ENCOUNTER — Other Ambulatory Visit: Payer: Self-pay | Admitting: Physician Assistant

## 2019-08-10 ENCOUNTER — Other Ambulatory Visit: Payer: Self-pay

## 2019-08-10 DIAGNOSIS — M170 Bilateral primary osteoarthritis of knee: Secondary | ICD-10-CM

## 2019-08-10 MED ORDER — DICLOFENAC SODIUM 1 % TD GEL: 4.0000 g | Freq: Four times a day (QID) | TRANSDERMAL | 1 refills | Status: DC | PRN

## 2019-08-20 ENCOUNTER — Other Ambulatory Visit: Payer: Self-pay

## 2019-08-20 ENCOUNTER — Ambulatory Visit (INDEPENDENT_AMBULATORY_CARE_PROVIDER_SITE_OTHER): Payer: BC Managed Care – PPO | Admitting: Obstetrics & Gynecology

## 2019-08-20 ENCOUNTER — Encounter: Payer: Self-pay | Admitting: Obstetrics & Gynecology

## 2019-08-20 VITALS — BP 158/100 | Ht 65.0 in | Wt 207.0 lb

## 2019-08-20 DIAGNOSIS — D219 Benign neoplasm of connective and other soft tissue, unspecified: Secondary | ICD-10-CM

## 2019-08-20 DIAGNOSIS — Z90710 Acquired absence of both cervix and uterus: Secondary | ICD-10-CM | POA: Insufficient documentation

## 2019-08-20 NOTE — Progress Notes (Signed)
  Postoperative Follow-up Stephanie Jacobs presents post op from [facility] for fibroids, 2 weeks ago. Path: DIAGNOSIS:  A. UTERUS WITH CERVIX AND BILATERAL FALLOPIAN TUBES; TOTAL HYSTERECTOMY  WITH BILATERAL SALPINGECTOMY:  - UTERINE CERVIX:  - BENIGN TRANSFORMATION ZONE.  - NEGATIVE FOR SQUAMOUS INTRAEPITHELIAL LESION AND MALIGNANCY.  - ENDOMETRIUM:  - DISORDERED PROLIFERATIVE ENDOMETRIUM.  - NEGATIVE FOR ATYPICAL HYPERPLASIA/EIN AND MALIGNANCY.  - MYOMETRIUM:  -NEGATIVE FOR FEATURES OF MALIGNANCY.  -  ADENOMYOSIS.  - LEIOMYOMATA UTERI.  - FALLOPIAN TUBES:  - NO SIGNIFICANT HISTOPATHOLOGIC CHANGE.  Images:  Subjective: Stephanie Jacobs reports marked improvement in her preop symptoms. Eating a regular diet without difficulty. The Stephanie Jacobs is not having any pain.  Activity: normal activities of daily living. Stephanie Jacobs reports additional symptom's since surgery of None.  Objective: BP (!) 158/100   Ht 5\' 5"  (1.651 m)   Wt 207 lb (93.9 kg)   LMP 07/21/2019 (Exact Date)   BMI 34.45 kg/m  Physical Exam Constitutional:      General: She is not in acute distress.    Appearance: She is well-developed.  Cardiovascular:     Rate and Rhythm: Normal rate.  Pulmonary:     Effort: Pulmonary effort is normal.  Abdominal:     General: There is no distension.     Palpations: Abdomen is soft.     Tenderness: There is no abdominal tenderness.     Comments: Incision Healing Well   Musculoskeletal: Normal range of motion.  Neurological:     Mental Status: She is alert and oriented to person, place, and time.     Cranial Nerves: No cranial nerve deficit.  Skin:    General: Skin is warm and dry.     Assessment: s/p :  total laparoscopic hysterectomy with bilateral salpingectomy progressing well  Plan: Stephanie Jacobs has done well after surgery with no apparent complications.  I have discussed the post-operative course to date, and the expected progress moving forward.  The Stephanie Jacobs understands what complications  to be concerned about.  I will see the Stephanie Jacobs in routine follow up, or sooner if needed.    Activity plan: May return to work in 8 days (RTW 08/28/2019).  Pelvic rest.  Hoyt Koch 08/20/2019, 10:50 AM

## 2019-08-22 ENCOUNTER — Telehealth: Payer: Self-pay

## 2019-08-22 NOTE — Telephone Encounter (Signed)
Letter written/faxed to requested #. Pt aware.

## 2019-08-22 NOTE — Telephone Encounter (Signed)
Yes, RTW no restrictions 08/28/19

## 2019-08-22 NOTE — Telephone Encounter (Signed)
Spoke w/patient to advise note would go to her my chart. She doesn't have access to a printer. Advised can fax to her work. Attn: Zena Amos. She doesn't have fax#. She will find out & call back with the fax #.

## 2019-08-22 NOTE — Telephone Encounter (Signed)
[employer] had asked her if she needed a work note & she declined, but states her work is asking for one stating that she has no restrictions. She has already discussed w/[facility] that she is planning to return to work 08/28/2019. [id number].

## 2019-08-22 NOTE — Telephone Encounter (Signed)
Fax [phone]

## 2019-09-17 ENCOUNTER — Encounter: Payer: Self-pay | Admitting: Obstetrics & Gynecology

## 2019-09-17 ENCOUNTER — Ambulatory Visit (INDEPENDENT_AMBULATORY_CARE_PROVIDER_SITE_OTHER): Payer: BC Managed Care – PPO | Admitting: Obstetrics & Gynecology

## 2019-09-17 ENCOUNTER — Other Ambulatory Visit: Payer: Self-pay

## 2019-09-17 VITALS — BP 138/98 | Ht 65.0 in | Wt 204.0 lb

## 2019-09-17 DIAGNOSIS — D219 Benign neoplasm of connective and other soft tissue, unspecified: Secondary | ICD-10-CM

## 2019-09-17 DIAGNOSIS — Z90710 Acquired absence of both cervix and uterus: Secondary | ICD-10-CM

## 2019-09-17 NOTE — Progress Notes (Signed)
  [name] presents post op from [facility] for fibroids, 6 weeks ago.  Subjective: Patient reports marked improvement in her preop symptoms. Eating a regular diet without difficulty. The patient is not having any pain.  Activity: normal activities of daily living. Patient reports additional symptom's since surgery of None.  Objective: BP (!) 138/98   Ht 5\' 5"  (1.651 m)   Wt 204 lb (92.5 kg)   BMI 33.95 kg/m  Physical Exam Constitutional:      General: She is not in acute distress.    Appearance: She is well-developed.  Genitourinary:     Pelvic exam was performed with patient supine.     Vagina and rectum normal.     No vaginal erythema or bleeding.     No right or left adnexal mass present.     Right adnexa not tender.     Left adnexa not tender.     Genitourinary Comments: Cervix and uterus absent. Vaginal cuff healing well.  Cardiovascular:     Rate and Rhythm: Normal rate.  Pulmonary:     Effort: Pulmonary effort is normal.  Abdominal:     General: There is no distension.     Palpations: Abdomen is soft.     Tenderness: There is no abdominal tenderness.     Comments: Incision healing well.  Musculoskeletal: Normal range of motion.  Neurological:     Mental Status: She is alert and oriented to person, place, and time.     Cranial Nerves: No cranial nerve deficit.  Skin:    General: Skin is warm and dry.     Assessment: s/p :  total laparoscopic hysterectomy with bilateral salpingectomy stable  Plan: Patient has done well after surgery with no apparent complications.  I have discussed the post-operative course to date, and the expected progress moving forward.  The patient understands what complications to be concerned about.  I will see the patient in routine follow up, or sooner if needed.    Activity plan: No restriction.  Pt plans [facility] soon  Hoyt Koch 09/17/2019, 2:49 PM

## 2019-09-24 ENCOUNTER — Other Ambulatory Visit: Payer: Self-pay

## 2019-09-24 ENCOUNTER — Encounter: Payer: Self-pay | Admitting: Obstetrics & Gynecology

## 2019-09-24 ENCOUNTER — Ambulatory Visit (INDEPENDENT_AMBULATORY_CARE_PROVIDER_SITE_OTHER): Payer: BC Managed Care – PPO | Admitting: Obstetrics & Gynecology

## 2019-09-24 VITALS — BP 140/90 | Ht 65.0 in | Wt 204.0 lb

## 2019-09-24 DIAGNOSIS — T8189XA Other complications of procedures, not elsewhere classified, initial encounter: Secondary | ICD-10-CM

## 2019-09-24 NOTE — Progress Notes (Signed)
  History of Present Illness:  Stephanie Jacobs is a 53 y.o. that had a problem w her incision from a prior laparoscopy that started last week, with redness edema and pus like drainage from RLQ incision. It still has visible evidence of a stitch present.  Improved this am but still tender.  PMHx: She  has a past medical history of Hypertension and Sickle cell trait ([place]). Also,  has a past surgical history that includes Tubal ligation (1995); Breast biopsy (Right, 05/02/2018); Total laparoscopic hysterectomy with bilateral salpingo oophorectomy (N/A, 08/07/2019); and Cystoscopy (08/07/2019)., family history includes Diabetes in her mother; Hypertension in her mother; Sickle cell anemia in her brother; Sickle cell trait in her brother.,  reports that she has been smoking cigarettes. She has been smoking about 0.50 packs per day. She has never used smokeless tobacco. She reports current alcohol use of about 7.0 standard drinks of alcohol per week. She reports that she does not use drugs. No outpatient medications have been marked as taking for the 09/24/19 encounter (Office Visit) with Gae Dry, MD.  .  Also, is allergic to tape..  Review of Systems  All other systems reviewed and are negative.   Physical Exam:  BP 140/90   Ht 5\' 5"  (1.651 m)   Wt 204 lb (92.5 kg)   BMI 33.95 kg/m  Body mass index is 33.95 kg/m. Constitutional: Well nourished, well developed female in no acute distress.  Abdomen: diffusely non tender to palpation, non distended, and no masses, hernias RLQ incision w small remnant of stitch (removed w scissors)    Mild erythema, no edema, no drainage Neuro: Grossly intact Psych:  Normal mood and affect.    Assessment:    ICD-10-CM   1. Suture granuloma, initial encounter  T81.89XA   Excised and expect recovery Monitor for drainage, redness  A total of 15 minutes were spent face-to-face with the patient during this encounter and over half of that time dealt with  counseling and coordination of care.  Barnett Applebaum, MD, [facility], [facility] 09/24/2019  10:54 AM

## 2019-12-19 ENCOUNTER — Other Ambulatory Visit: Payer: Self-pay | Admitting: Physician Assistant

## 2019-12-19 NOTE — Telephone Encounter (Signed)
I am not sure this is correct.  She is not our patient and it does not look like she is anyones

## 2019-12-19 NOTE — Telephone Encounter (Signed)
Requested medication (s) are due for refill today: no  Requested medication (s) are on the active medication list: yes  Last refill:  07/05/2019  Future visit scheduled: no  Notes to clinic:  no valid encounter within last 12 months   Requested Prescriptions  Pending Prescriptions Disp Refills   diclofenac Sodium (VOLTAREN) 1 % GEL [Pharmacy Med Name: DICLOFENAC SODIUM 1% GEL] 100 g     Sig: APPLY 4 G TOPICALLY 4 (FOUR) TIMES DAILY.      Analgesics:  Topicals Failed - 12/19/2019 11:40 AM      Failed - Valid encounter within last 12 months    Recent Outpatient Visits     None

## 2020-01-15 ENCOUNTER — Other Ambulatory Visit: Payer: Self-pay | Admitting: Physician Assistant

## 2020-01-15 DIAGNOSIS — I10 Essential (primary) hypertension: Secondary | ICD-10-CM

## 2020-01-18 ENCOUNTER — Other Ambulatory Visit: Payer: Self-pay

## 2020-01-18 ENCOUNTER — Ambulatory Visit
Admission: RE | Admit: 2020-01-18 | Discharge: 2020-01-18 | Disposition: A | Discharge status: Home / Self Care | Payer: BC Managed Care – PPO | Attending: Physician Assistant | Admitting: Physician Assistant

## 2020-01-18 ENCOUNTER — Encounter: Payer: Self-pay | Admitting: Physician Assistant

## 2020-01-18 ENCOUNTER — Ambulatory Visit (INDEPENDENT_AMBULATORY_CARE_PROVIDER_SITE_OTHER): Payer: BC Managed Care – PPO | Admitting: Physician Assistant

## 2020-01-18 ENCOUNTER — Telehealth: Payer: Self-pay

## 2020-01-18 ENCOUNTER — Ambulatory Visit
Admission: RE | Admit: 2020-01-18 | Discharge: 2020-01-18 | Disposition: A | Discharge status: Home / Self Care | Payer: BC Managed Care – PPO | Source: Ambulatory Visit | Attending: Physician Assistant | Admitting: Physician Assistant

## 2020-01-18 VITALS — BP 145/96 | HR 93 | Temp 96.8°F | Ht 63.0 in | Wt 203.4 lb

## 2020-01-18 DIAGNOSIS — M25561 Pain in right knee: Secondary | ICD-10-CM

## 2020-01-18 DIAGNOSIS — Z1211 Encounter for screening for malignant neoplasm of colon: Secondary | ICD-10-CM | POA: Diagnosis not present

## 2020-01-18 DIAGNOSIS — Z0000 Encounter for general adult medical examination without abnormal findings: Secondary | ICD-10-CM | POA: Diagnosis not present

## 2020-01-18 DIAGNOSIS — I10 Essential (primary) hypertension: Secondary | ICD-10-CM

## 2020-01-18 DIAGNOSIS — N393 Stress incontinence (female) (male): Secondary | ICD-10-CM | POA: Diagnosis not present

## 2020-01-18 DIAGNOSIS — Z1231 Encounter for screening mammogram for malignant neoplasm of breast: Secondary | ICD-10-CM | POA: Diagnosis not present

## 2020-01-18 DIAGNOSIS — M25461 Effusion, right knee: Secondary | ICD-10-CM | POA: Diagnosis not present

## 2020-01-18 MED ORDER — DICLOFENAC SODIUM 1 % EX GEL: 4.0000 g | Freq: Four times a day (QID) | CUTANEOUS | 1 refills | Status: DC

## 2020-01-18 MED ORDER — METOPROLOL TARTRATE 25 MG PO TABS: ORAL_TABLET | ORAL | 0 refills | Status: DC

## 2020-01-18 NOTE — Patient Instructions (Signed)
Health Maintenance, Female Adopting a healthy lifestyle and getting preventive care are important in promoting health and wellness. Ask your health care provider about:  The right schedule for you to have regular tests and exams.  Things you can do on your own to prevent diseases and keep yourself healthy. What should I know about diet, weight, and exercise? Eat a healthy diet   Eat a diet that includes plenty of vegetables, fruits, low-fat dairy products, and lean protein.  Do not eat a lot of foods that are high in solid fats, added sugars, or sodium. Maintain a healthy weight Body mass index (BMI) is used to identify weight problems. It estimates body fat based on height and weight. Your health care provider can help determine your BMI and help you achieve or maintain a healthy weight. Get regular exercise Get regular exercise. This is one of the most important things you can do for your health. Most adults should:  Exercise for at least 150 minutes each week. The exercise should increase your heart rate and make you sweat (moderate-intensity exercise).  Do strengthening exercises at least twice a week. This is in addition to the moderate-intensity exercise.  Spend less time sitting. Even light physical activity can be beneficial. Watch cholesterol and blood lipids Have your blood tested for lipids and cholesterol at 52 years of age, then have this test every 5 years. Have your cholesterol levels checked more often if:  Your lipid or cholesterol levels are high.  You are older than 52 years of age.  You are at high risk for heart disease. What should I know about cancer screening? Depending on your health history and family history, you may need to have cancer screening at various ages. This may include screening for:  Breast cancer.  Cervical cancer.  Colorectal cancer.  Skin cancer.  Lung cancer. What should I know about heart disease, diabetes, and high blood  pressure? Blood pressure and heart disease  High blood pressure causes heart disease and increases the risk of stroke. This is more likely to develop in people who have high blood pressure readings, are of African descent, or are overweight.  Have your blood pressure checked: ? Every 3-5 years if you are 18-39 years of age. ? Every year if you are 40 years old or older. Diabetes Have regular diabetes screenings. This checks your fasting blood sugar level. Have the screening done:  Once every three years after age 40 if you are at a normal weight and have a low risk for diabetes.  More often and at a younger age if you are overweight or have a high risk for diabetes. What should I know about preventing infection? Hepatitis B If you have a higher risk for hepatitis B, you should be screened for this virus. Talk with your health care provider to find out if you are at risk for hepatitis B infection. Hepatitis C Testing is recommended for:  Everyone born from 1945 through 1965.  Anyone with known risk factors for hepatitis C. Sexually transmitted infections (STIs)  Get screened for STIs, including gonorrhea and chlamydia, if: ? You are sexually active and are younger than 52 years of age. ? You are older than 52 years of age and your health care provider tells you that you are at risk for this type of infection. ? Your sexual activity has changed since you were last screened, and you are at increased risk for chlamydia or gonorrhea. Ask your health care provider if   you are at risk.  Ask your health care provider about whether you are at high risk for HIV. Your health care provider may recommend a prescription medicine to help prevent HIV infection. If you choose to take medicine to prevent HIV, you should first get tested for HIV. You should then be tested every 3 months for as long as you are taking the medicine. Pregnancy  If you are about to stop having your period (premenopausal) and  you may become pregnant, seek counseling before you get pregnant.  Take 400 to 800 micrograms (mcg) of folic acid every day if you become pregnant.  Ask for birth control (contraception) if you want to prevent pregnancy. Osteoporosis and menopause Osteoporosis is a disease in which the bones lose minerals and strength with aging. This can result in bone fractures. If you are 65 years old or older, or if you are at risk for osteoporosis and fractures, ask your health care provider if you should:  Be screened for bone loss.  Take a calcium or vitamin D supplement to lower your risk of fractures.  Be given hormone replacement therapy (HRT) to treat symptoms of menopause. Follow these instructions at home: Lifestyle  Do not use any products that contain nicotine or tobacco, such as cigarettes, e-cigarettes, and chewing tobacco. If you need help quitting, ask your health care provider.  Do not use street drugs.  Do not share needles.  Ask your health care provider for help if you need support or information about quitting drugs. Alcohol use  Do not drink alcohol if: ? Your health care provider tells you not to drink. ? You are pregnant, may be pregnant, or are planning to become pregnant.  If you drink alcohol: ? Limit how much you use to 0-1 drink a day. ? Limit intake if you are breastfeeding.  Be aware of how much alcohol is in your drink. In the [place] one drink equals one 12 oz bottle of beer (355 mL), one 5 oz glass of wine (148 mL), or one 1 oz glass of hard liquor (44 mL). General instructions  Schedule regular health, dental, and eye exams.  Stay current with your vaccines.  Tell your health care provider if: ? You often feel depressed. ? You have ever been abused or do not feel safe at home. Summary  Adopting a healthy lifestyle and getting preventive care are important in promoting health and wellness.  Follow your health care provider's instructions about healthy  diet, exercising, and getting tested or screened for diseases.  Follow your health care provider's instructions on monitoring your cholesterol and blood pressure. This information is not intended to replace advice given to you by your health care provider. Make sure you discuss any questions you have with your health care provider. Document Revised: 11/29/2018 Document Reviewed: 11/29/2018 Elsevier Patient Education  2020 Elsevier Inc.  

## 2020-01-18 NOTE — Progress Notes (Signed)
Patient: Stephanie Jacobs, Female    DOB: Jun 20, 1966, 54 y.o.   MRN: [id number] Visit Date: 01/18/2020  Today's Provider: Trinna Post, PA-C   Chief Complaint  Patient presents with  . Annual Exam   Subjective:    Annual physical exam Stephanie Jacobs is a 54 y.o. female who presents today for health maintenance and complete physical. She feels well. She reports exercising includes walking at work. She reports she is sleeping well.  BP Readings from Last 8 Encounters:  01/18/20 (!) 145/96  09/24/19 140/90  09/17/19 (!) 138/98  08/20/19 (!) 158/100  08/07/19 (!) 161/86  07/30/19 (!) 150/98  07/18/19 130/80  07/03/19 130/90   Hypertension: using amlodipine 10 mg daily, losartan 100 mg daily, hctz 25 mg daily.   She had a total hysterectomy 07/2019 for fibroids.   She is also having issues with incontinence when she coughs at night. She reports this is the only time she has incontinence. She does not have urinary frequency or a sense of urgency. She has had four children all delivered vaginally.   Reports pain in her right knee after landing wrong on a step several months ago. Reports it will be intermittently swollen and painful.  ----------------------------------------------------------------- Last pap:03/10/2018 Last mammogram:04/13/2018   Review of Systems  Constitutional: Negative.   HENT: Negative.   Eyes: Negative.   Respiratory: Negative.   Cardiovascular: Negative.   Gastrointestinal: Negative.   Endocrine: Negative.   Genitourinary: Negative.   Musculoskeletal: Negative.   Skin: Negative.   Allergic/Immunologic: Negative.   Neurological: Negative.   Hematological: Negative.   Psychiatric/Behavioral: Negative.     Social History She  reports that she has been smoking cigarettes. She has been smoking about 0.50 packs per day. She has never used smokeless tobacco. She reports current alcohol use of about 7.0 standard drinks of alcohol per week. She  reports that she does not use drugs. Social History   Socioeconomic History  . Marital status: Married    Spouse name: Not on file  . Number of children: Not on file  . Years of education: Not on file  . Highest education level: Not on file  Occupational History  . Not on file  Tobacco Use  . Smoking status: Current Every Day Smoker    Packs/day: 0.50    Types: Cigarettes  . Smokeless tobacco: Never Used  Substance and Sexual Activity  . Alcohol use: Yes    Alcohol/week: 7.0 standard drinks    Types: 7 Glasses of wine per week    Comment: One glass of red wine in the evenings.  . Drug use: No  . Sexual activity: Never  Other Topics Concern  . Not on file  Social History Narrative  . Not on file   Social Determinants of Health   Financial Resource Strain:   . Difficulty of Paying Living Expenses: Not on file  Food Insecurity:   . Worried About Charity fundraiser in the Last Year: Not on file  . Ran Out of Food in the Last Year: Not on file  Transportation Needs:   . Lack of Transportation (Medical): Not on file  . Lack of Transportation (Non-Medical): Not on file  Physical Activity:   . Days of Exercise per Week: Not on file  . Minutes of Exercise per Session: Not on file  Stress:   . Feeling of Stress : Not on file  Social Connections:   . Frequency of Communication  with Friends and Family: Not on file  . Frequency of Social Gatherings with Friends and Family: Not on file  . Attends Religious Services: Not on file  . Active Member of Clubs or Organizations: Not on file  . Attends Archivist Meetings: Not on file  . Marital Status: Not on file    Patient Active Problem List   Diagnosis Date Noted  . S/P laparoscopic hysterectomy 08/20/2019  . Fibroids 08/07/2019  . Menometrorrhagia 07/03/2019  . Alcohol abuse 03/09/2019  . Anxiety 05/07/2015  . Abnormal LFTs 05/07/2015  . Essential (primary) hypertension 05/07/2015  . Blood glucose elevated  05/07/2015  . Counseling on substance use and abuse 05/07/2015    Past Surgical History:  Procedure Laterality Date  . BREAST BIOPSY Right 05/02/2018   Affirm Bx- path pending - coil clip  . CYSTOSCOPY  08/07/2019   Procedure: CYSTOSCOPY;  Surgeon: Gae Dry, MD;  Location: [facility] ORS;  Service: Gynecology;;  . TOTAL LAPAROSCOPIC HYSTERECTOMY WITH BILATERAL SALPINGO OOPHORECTOMY N/A 08/07/2019   Procedure: TOTAL LAPAROSCOPIC HYSTERECTOMY WITH BILATERAL SALPINGO;  Surgeon: Gae Dry, MD;  Location: [facility] ORS;  Service: Gynecology;  Laterality: N/A;  . [place]    Family History  Family Status  Relation Name Status  . Mother  Alive  . Father  Alive  . Brother  Deceased  . Brother  (Not Specified)  . Neg Hx  (Not Specified)   Her family history includes Diabetes in her mother; Hypertension in her mother; Sickle cell anemia in her brother; Sickle cell trait in her brother.     Allergies  Allergen Reactions  . Tape Rash    Previous Medications   AMLODIPINE (NORVASC) 10 MG TABLET    Take 1 tablet (10 mg total) by mouth daily.   ASPIRIN 81 MG TABLET    Take 81 mg by mouth daily.    DICLOFENAC SODIUM (VOLTAREN) 1 % GEL    Apply 4 g topically 4 (four) times daily as needed (pain).   HYDROCHLOROTHIAZIDE (HYDRODIURIL) 25 MG TABLET    Take 1 tablet (25 mg total) by mouth daily.   LOSARTAN (COZAAR) 100 MG TABLET    Take 1 tablet (100 mg total) by mouth daily.   MULTIPLE VITAMIN (MULTIVITAMIN) TABLET    Take 1 tablet by mouth daily.    Patient Care Team: Paulene Floor as PCP - General (Physician Assistant)      Objective:   Vitals: BP (!) 145/96 (BP Location: Left Arm, Patient Position: Sitting, Cuff Size: Normal)   Pulse 93   Temp (!) 96.8 F (36 C) (Temporal)   Ht 5\' 3"  (1.6 m)   Wt 203 lb 6.4 oz (92.3 kg)   LMP 07/21/2019 (Exact Date)   BMI 36.03 kg/m    Physical Exam Constitutional:      Appearance: Normal appearance.  Cardiovascular:      Rate and Rhythm: Normal rate and regular rhythm.     Heart sounds: Normal heart sounds.  Pulmonary:     Effort: Pulmonary effort is normal.     Breath sounds: Normal breath sounds.  Musculoskeletal:     Right knee: Swelling present. No deformity or effusion. No tenderness.     Left knee: Normal.  Skin:    General: Skin is warm and dry.  Neurological:     Mental Status: She is alert and oriented to person, place, and time. Mental status is at baseline.  Psychiatric:  Mood and Affect: Mood normal.        Behavior: Behavior normal.      Depression Screen PHQ 2/9 Scores 01/18/2020 10/13/2018 02/22/2018  PHQ - 2 Score 0 0 0  PHQ- 9 Score 0 - -      Assessment & Plan:     Routine Health Maintenance and Physical Exam  Exercise Activities and Dietary recommendations Goals   None     Immunization History  Administered Date(s) Administered  . Tdap 05/26/2018    Health Maintenance  Topic Date Due  . HIV Screening  12/07/1983  . COLONOSCOPY  12/06/2018  . INFLUENZA VACCINE  10/13/2024 (Originally 07/21/2019)  . MAMMOGRAM  04/12/2020  . TETANUS/TDAP  05/26/2028  . PAP SMEAR-Modifier  Discontinued     Discussed health benefits of physical activity, and encouraged her to engage in regular exercise appropriate for her age and condition.    1. Annual physical exam  Continues to drink 2-3 glasses of wine per night which is less than previous.   - TSH - Lipid panel - Comprehensive metabolic panel - CBC with Differential/Platelet  2. Encounter for screening mammogram for malignant neoplasm of breast  - MM Digital Diagnostic Bilat; Future  3. Colon cancer screening  - Ambulatory referral to Gastroenterology  4. Essential (primary) hypertension  - metoprolol tartrate (LOPRESSOR) 25 MG tablet; Take 1/2 tablet twice a day. If BP is still > 120/80 after two weeks, then increase to 1 tablet twice daily.  Dispense: 180 tablet; Refill: 0 - TSH - Lipid panel -  Comprehensive metabolic panel - CBC with Differential/Platelet  5. Acute pain of right knee  - DG Knee Complete 4 Views Right; Future - diclofenac Sodium (VOLTAREN) 1 % GEL; Apply 4 g topically 4 (four) times daily.  Dispense: 150 g; Refill: 1  6. Stress incontinence  - Ambulatory referral to Physical Therapy  --------------------------------------------------------------------

## 2020-01-18 NOTE — Telephone Encounter (Signed)
Gastroenterology Pre-Procedure Review  Request Date: Friday 02/22/20 Requesting Physician: Dr. Vicente Males  PATIENT REVIEW QUESTIONS: The patient responded to the following health history questions as indicated:    1. Are you having any GI issues? no 2. Do you have a personal history of Polyps? no 3. Do you have a family history of Colon Cancer or Polyps? no 4. Diabetes Mellitus? no 5. Joint replacements in the past 12 months?Hysterectomy 08/07/2019 6. Major health problems in the past 3 months?no 7. Any artificial heart valves, MVP, or defibrillator?no    MEDICATIONS & ALLERGIES:    Patient reports the following regarding taking any anticoagulation/antiplatelet therapy:   Plavix, Coumadin, Eliquis, Xarelto, Lovenox, Pradaxa, Brilinta, or Effient? no Aspirin? no  Patient confirms/reports the following medications:  Current Outpatient Medications  Medication Sig Dispense Refill  . amLODipine (NORVASC) 10 MG tablet Take 1 tablet (10 mg total) by mouth daily. 90 tablet 1  . aspirin 81 MG tablet Take 81 mg by mouth daily.     . diclofenac Sodium (VOLTAREN) 1 % GEL Apply 4 g topically 4 (four) times daily. 150 g 1  . hydrochlorothiazide (HYDRODIURIL) 25 MG tablet Take 1 tablet (25 mg total) by mouth daily. 90 tablet 0  . losartan (COZAAR) 100 MG tablet Take 1 tablet (100 mg total) by mouth daily. 90 tablet 1  . metoprolol tartrate (LOPRESSOR) 25 MG tablet Take 1/2 tablet twice a day. If BP is still > 120/80 after two weeks, then increase to 1 tablet twice daily. 180 tablet 0  . Multiple Vitamin (MULTIVITAMIN) tablet Take 1 tablet by mouth daily.     No current facility-administered medications for this visit.    Patient confirms/reports the following allergies:  Allergies  Allergen Reactions  . Tape Rash    No orders of the defined types were placed in this encounter.   AUTHORIZATION INFORMATION Primary Insurance: 1D#: Group #:  Secondary Insurance: 1D#: Group #:  SCHEDULE  INFORMATION: Date: Friday 03/05 Time: Location:[facility]

## 2020-01-19 LAB — COMPREHENSIVE METABOLIC PANEL
ALT: 68 IU/L — ABNORMAL HIGH (ref 0–32)
AST: 78 IU/L — ABNORMAL HIGH (ref 0–40)
Albumin/Globulin Ratio: 1.6 (ref 1.2–2.2)
Albumin: 5 g/dL — ABNORMAL HIGH (ref 3.8–4.9)
Alkaline Phosphatase: 75 IU/L (ref 39–117)
BUN/Creatinine Ratio: 16 (ref 9–23)
BUN: 12 mg/dL (ref 6–24)
Bilirubin Total: 0.8 mg/dL (ref 0.0–1.2)
CO2: 24 mmol/L (ref 20–29)
Calcium: 9.7 mg/dL (ref 8.7–10.2)
Chloride: 94 mmol/L — ABNORMAL LOW (ref 96–106)
Creatinine, Ser: 0.75 mg/dL (ref 0.57–1.00)
GFR calc Af Amer: 107 mL/min/{1.73_m2} (ref >59)
GFR calc non Af Amer: 93 mL/min/{1.73_m2} (ref >59)
Globulin, Total: 3.1 g/dL (ref 1.5–4.5)
Glucose: 76 mg/dL (ref 65–99)
Potassium: 3.9 mmol/L (ref 3.5–5.2)
Sodium: 136 mmol/L (ref 134–144)
Total Protein: 8.1 g/dL (ref 6.0–8.5)

## 2020-01-19 LAB — TSH: TSH: 1.68 u[IU]/mL (ref 0.450–4.500)

## 2020-01-19 LAB — CBC WITH DIFFERENTIAL/PLATELET
Basophils Absolute: 0 10*3/uL (ref 0.0–0.2)
Basos: 0 %
EOS (ABSOLUTE): 0.1 10*3/uL (ref 0.0–0.4)
Eos: 1 %
Hematocrit: 46.9 % — ABNORMAL HIGH (ref 34.0–46.6)
Hemoglobin: 15.6 g/dL (ref 11.1–15.9)
Immature Grans (Abs): 0 10*3/uL (ref 0.0–0.1)
Immature Granulocytes: 0 %
Lymphocytes Absolute: 2.6 10*3/uL (ref 0.7–3.1)
Lymphs: 30 %
MCH: 30.3 pg (ref 26.6–33.0)
MCHC: 33.3 g/dL (ref 31.5–35.7)
MCV: 91 fL (ref 79–97)
Monocytes Absolute: 0.7 10*3/uL (ref 0.1–0.9)
Monocytes: 9 %
Neutrophils Absolute: 5.2 10*3/uL (ref 1.4–7.0)
Neutrophils: 60 %
Platelets: 325 10*3/uL (ref 150–450)
RBC: 5.15 x10E6/uL (ref 3.77–5.28)
RDW: 13.8 % (ref 11.7–15.4)
WBC: 8.6 10*3/uL (ref 3.4–10.8)

## 2020-01-19 LAB — LIPID PANEL
Chol/HDL Ratio: 2.9 ratio (ref 0.0–4.4)
Cholesterol, Total: 196 mg/dL (ref 100–199)
HDL: 67 mg/dL (ref >39)
LDL Chol Calc (NIH): 114 mg/dL — ABNORMAL HIGH (ref 0–99)
Triglycerides: 85 mg/dL (ref 0–149)
VLDL Cholesterol Cal: 15 mg/dL (ref 5–40)

## 2020-01-22 ENCOUNTER — Encounter: Payer: Self-pay | Admitting: Physician Assistant

## 2020-01-22 DIAGNOSIS — M25461 Effusion, right knee: Secondary | ICD-10-CM

## 2020-01-29 ENCOUNTER — Other Ambulatory Visit: Payer: Self-pay | Admitting: Physician Assistant

## 2020-01-29 DIAGNOSIS — Z1231 Encounter for screening mammogram for malignant neoplasm of breast: Secondary | ICD-10-CM

## 2020-02-04 ENCOUNTER — Other Ambulatory Visit: Payer: Self-pay | Admitting: Physician Assistant

## 2020-02-04 DIAGNOSIS — I10 Essential (primary) hypertension: Secondary | ICD-10-CM

## 2020-02-04 NOTE — Telephone Encounter (Signed)
Refill request for HYDROCHLOROTHIAZIDE 25 MG TAB. Protocol stated pt needs an office visit for this refill. LOV  01/18/20 Please review

## 2020-02-06 ENCOUNTER — Ambulatory Visit: Payer: BC Managed Care – PPO

## 2020-02-08 DIAGNOSIS — M25561 Pain in right knee: Secondary | ICD-10-CM | POA: Diagnosis not present

## 2020-02-08 DIAGNOSIS — S8391XA Sprain of unspecified site of right knee, initial encounter: Secondary | ICD-10-CM | POA: Diagnosis not present

## 2020-02-08 DIAGNOSIS — M25562 Pain in left knee: Secondary | ICD-10-CM | POA: Diagnosis not present

## 2020-02-20 ENCOUNTER — Other Ambulatory Visit: Payer: Self-pay

## 2020-02-20 ENCOUNTER — Other Ambulatory Visit
Admission: RE | Admit: 2020-02-20 | Discharge: 2020-02-20 | Disposition: A | Discharge status: Home / Self Care | Payer: BC Managed Care – PPO | Source: Ambulatory Visit | Attending: Gastroenterology | Admitting: Gastroenterology

## 2020-02-20 DIAGNOSIS — Z20822 Contact with and (suspected) exposure to covid-19: Secondary | ICD-10-CM | POA: Insufficient documentation

## 2020-02-20 DIAGNOSIS — Z01812 Encounter for preprocedural laboratory examination: Secondary | ICD-10-CM | POA: Insufficient documentation

## 2020-02-20 LAB — SARS CORONAVIRUS 2 (TAT 6-24 HRS): SARS Coronavirus 2: NEGATIVE

## 2020-02-21 ENCOUNTER — Encounter: Payer: Self-pay | Admitting: Gastroenterology

## 2020-02-22 ENCOUNTER — Encounter
Admission: RE | Disposition: A | Discharge status: Home / Self Care | Payer: Self-pay | Source: Home / Self Care | Attending: Gastroenterology

## 2020-02-22 ENCOUNTER — Encounter: Payer: Self-pay | Admitting: Gastroenterology

## 2020-02-22 ENCOUNTER — Ambulatory Visit: Payer: BC Managed Care – PPO | Admitting: Anesthesiology

## 2020-02-22 ENCOUNTER — Ambulatory Visit
Admission: RE | Admit: 2020-02-22 | Discharge: 2020-02-22 | Disposition: A | Discharge status: Home / Self Care | Payer: BC Managed Care – PPO | Attending: Gastroenterology | Admitting: Gastroenterology

## 2020-02-22 ENCOUNTER — Other Ambulatory Visit: Payer: Self-pay

## 2020-02-22 DIAGNOSIS — F17210 Nicotine dependence, cigarettes, uncomplicated: Secondary | ICD-10-CM | POA: Insufficient documentation

## 2020-02-22 DIAGNOSIS — D175 Benign lipomatous neoplasm of intra-abdominal organs: Secondary | ICD-10-CM

## 2020-02-22 DIAGNOSIS — Z79899 Other long term (current) drug therapy: Secondary | ICD-10-CM | POA: Insufficient documentation

## 2020-02-22 DIAGNOSIS — K635 Polyp of colon: Secondary | ICD-10-CM

## 2020-02-22 DIAGNOSIS — Z1211 Encounter for screening for malignant neoplasm of colon: Secondary | ICD-10-CM

## 2020-02-22 DIAGNOSIS — D120 Benign neoplasm of cecum: Secondary | ICD-10-CM | POA: Insufficient documentation

## 2020-02-22 DIAGNOSIS — I10 Essential (primary) hypertension: Secondary | ICD-10-CM | POA: Diagnosis not present

## 2020-02-22 DIAGNOSIS — K5730 Diverticulosis of large intestine without perforation or abscess without bleeding: Secondary | ICD-10-CM

## 2020-02-22 DIAGNOSIS — Z7982 Long term (current) use of aspirin: Secondary | ICD-10-CM | POA: Insufficient documentation

## 2020-02-22 DIAGNOSIS — D126 Benign neoplasm of colon, unspecified: Secondary | ICD-10-CM | POA: Diagnosis not present

## 2020-02-22 DIAGNOSIS — D573 Sickle-cell trait: Secondary | ICD-10-CM | POA: Insufficient documentation

## 2020-02-22 DIAGNOSIS — D122 Benign neoplasm of ascending colon: Secondary | ICD-10-CM | POA: Insufficient documentation

## 2020-02-22 DIAGNOSIS — Z8249 Family history of ischemic heart disease and other diseases of the circulatory system: Secondary | ICD-10-CM | POA: Insufficient documentation

## 2020-02-22 DIAGNOSIS — K5790 Diverticulosis of intestine, part unspecified, without perforation or abscess without bleeding: Secondary | ICD-10-CM | POA: Diagnosis not present

## 2020-02-22 HISTORY — PX: COLONOSCOPY WITH PROPOFOL: SHX5780

## 2020-02-22 SURGERY — COLONOSCOPY WITH PROPOFOL
Anesthesia: General

## 2020-02-22 MED ORDER — SODIUM CHLORIDE 0.9 % IV SOLN
INTRAVENOUS | Status: DC
  Administered 2020-02-22: 1000 mL via INTRAVENOUS

## 2020-02-22 MED ORDER — PROPOFOL 500 MG/50ML IV EMUL
INTRAVENOUS | Status: DC | PRN
  Administered 2020-02-22: 175 ug/kg/min via INTRAVENOUS

## 2020-02-22 MED ORDER — PROPOFOL 10 MG/ML IV BOLUS
INTRAVENOUS | Status: DC | PRN
  Administered 2020-02-22: 60 mg via INTRAVENOUS

## 2020-02-22 MED ORDER — PROPOFOL 500 MG/50ML IV EMUL
INTRAVENOUS | Status: AC
  Filled 2020-02-22: qty 50

## 2020-02-22 MED ORDER — LIDOCAINE HCL (CARDIAC) PF 100 MG/5ML IV SOSY
PREFILLED_SYRINGE | INTRAVENOUS | Status: DC | PRN
  Administered 2020-02-22: 50 mg via INTRAVENOUS

## 2020-02-22 NOTE — H&P (Signed)
Stephanie Bellows, MD [address] [place], [place], [place], [address] [id number], [place], [place], [place], [address] Phone: [phone]  Fax: [phone]  Primary Care Physician:  Trinna Post, PA-C   Pre-Procedure History & Physical: HPI:  Stephanie Jacobs is a 54 y.o. female is here for an colonoscopy.   Past Medical History:  Diagnosis Date  . Hypertension   . Sickle cell trait [facility])     Past Surgical History:  Procedure Laterality Date  . ABDOMINAL HYSTERECTOMY    . BREAST BIOPSY Right 05/02/2018   Affirm Bx- path pending - coil clip  . CYSTOSCOPY  08/07/2019   Procedure: CYSTOSCOPY;  Surgeon: Gae Dry, MD;  Location: [facility] ORS;  Service: Gynecology;;  . DIAGNOSTIC LAPAROSCOPY    . TOTAL LAPAROSCOPIC HYSTERECTOMY WITH BILATERAL SALPINGO OOPHORECTOMY N/A 08/07/2019   Procedure: TOTAL LAPAROSCOPIC HYSTERECTOMY WITH BILATERAL SALPINGO;  Surgeon: Gae Dry, MD;  Location: [facility] ORS;  Service: Gynecology;  Laterality: N/A;  . [place]    Prior to Admission medications   Medication Sig Start Date End Date Taking? Authorizing Provider  amLODipine (NORVASC) 10 MG tablet Take 1 tablet (10 mg total) by mouth daily. 07/26/19  Yes Trinna Post, PA-C  aspirin 81 MG tablet Take 81 mg by mouth daily.    Yes [provider]  diclofenac Sodium (VOLTAREN) 1 % GEL Apply 4 g topically 4 (four) times daily. 01/18/20  Yes Carles Collet M, PA-C  hydrochlorothiazide (HYDRODIURIL) 25 MG tablet TAKE 1 TABLET BY MOUTH EVERY DAY 02/05/20  Yes Carles Collet M, PA-C  metoprolol tartrate (LOPRESSOR) 25 MG tablet Take 1/2 tablet twice a day. If BP is still > 120/80 after two weeks, then increase to 1 tablet twice daily. 01/18/20  Yes Trinna Post, PA-C  Multiple Vitamin (MULTIVITAMIN) tablet Take 1 tablet by mouth daily.   Yes [provider]  losartan (COZAAR) 100 MG tablet Take 1 tablet (100 mg total) by mouth daily. 07/26/19 01/22/20   Trinna Post, PA-C    Allergies as of 01/18/2020 - Review Complete 01/18/2020  Allergen Reaction Noted  . Tape Rash 05/07/2015    Family History  Problem Relation Age of Onset  . Hypertension Mother   . Diabetes Mother   . Sickle cell anemia Brother   . Sickle cell trait Brother   . Breast cancer Neg Hx     Social History   Socioeconomic History  . Marital status: Married    Spouse name: Not on file  . Number of children: Not on file  . Years of education: Not on file  . Highest education level: Not on file  Occupational History  . Not on file  Tobacco Use  . Smoking status: Current Every Day Smoker    Packs/day: 0.50    Types: Cigarettes  . Smokeless tobacco: Never Used  Substance and Sexual Activity  . Alcohol use: Yes    Alcohol/week: 7.0 standard drinks    Types: 7 Glasses of wine per week    Comment: One glass of red wine in the evenings.  . Drug use: No  . Sexual activity: Never  Other Topics Concern  . Not on file  Social History Narrative  . Not on file   Social Determinants of Health   Financial Resource Strain:   . Difficulty of Paying Living Expenses: Not on file  Food Insecurity:   . Worried About Charity fundraiser in the  Last Year: Not on file  . Ran Out of Food in the Last Year: Not on file  Transportation Needs:   . Lack of Transportation (Medical): Not on file  . Lack of Transportation (Non-Medical): Not on file  Physical Activity:   . Days of Exercise per Week: Not on file  . Minutes of Exercise per Session: Not on file  Stress:   . Feeling of Stress : Not on file  Social Connections:   . Frequency of Communication with Friends and Family: Not on file  . Frequency of Social Gatherings with Friends and Family: Not on file  . Attends Religious Services: Not on file  . Active Member of Clubs or Organizations: Not on file  . Attends Archivist Meetings: Not on file  . Marital Status: Not on file  Intimate Partner  Violence:   . Fear of Current or Ex-Partner: Not on file  . Emotionally Abused: Not on file  . Physically Abused: Not on file  . Sexually Abused: Not on file    Review of Systems: See HPI, otherwise negative ROS  Physical Exam: Pulse 66   Temp 97.8 F (36.6 C) (Temporal)   Resp 18   Ht 5\' 3"  (1.6 m)   Wt 92.3 kg   LMP 07/21/2019 (Exact Date)   SpO2 100%   BMI 36.03 kg/m  General:   Alert,  pleasant and cooperative in NAD Head:  Normocephalic and atraumatic. Neck:  Supple; no masses or thyromegaly. Lungs:  Clear throughout to auscultation, normal respiratory effort.    Heart:  +S1, +S2, Regular rate and rhythm, No edema. Abdomen:  Soft, nontender and nondistended. Normal bowel sounds, without guarding, and without rebound.   Neurologic:  Alert and  oriented x4;  grossly normal neurologically.  Impression/Plan: MORI DUEY is here for an colonoscopy to be performed for Screening colonoscopy average risk   Risks, benefits, limitations, and alternatives regarding  colonoscopy have been reviewed with the patient.  Questions have been answered.  All parties agreeable.   Stephanie Bellows, MD  02/22/2020, 8:37 AM

## 2020-02-22 NOTE — Transfer of Care (Signed)
Immediate Anesthesia Transfer of Care Note  Patient: ESLY JIMINIAN  Procedure(s) Performed: COLONOSCOPY WITH PROPOFOL (N/A )  Patient Location: PACU  Anesthesia Type:General  Level of Consciousness: sedated  Airway & Oxygen Therapy: Patient Spontanous Breathing and Patient connected to nasal cannula oxygen  Post-op Assessment: Report given to RN and Post -op Vital signs reviewed and stable  Post vital signs: Reviewed and stable  Last Vitals:  Vitals Value Taken Time  BP 94/57 02/22/20 0910  Temp 36.1 C 02/22/20 0909  Pulse 62 02/22/20 0910  Resp 18 02/22/20 0910  SpO2 95 % 02/22/20 0910    Last Pain:  Vitals:   02/22/20 0909  TempSrc: Temporal  PainSc: Asleep         Complications: No apparent anesthesia complications

## 2020-02-22 NOTE — Anesthesia Preprocedure Evaluation (Signed)
Anesthesia Evaluation  Patient identified by MRN, date of birth, ID band Patient awake    Reviewed: Allergy & Precautions, H&P , NPO status , Patient's Chart, lab work & pertinent test results, reviewed documented beta blocker date and time   Airway Mallampati: II   Neck ROM: full    Dental  (+) Poor Dentition   Pulmonary neg pulmonary ROS, Current Smoker and Patient abstained from smoking.,    Pulmonary exam normal        Cardiovascular Exercise Tolerance: Good hypertension, On Medications negative cardio ROS Normal cardiovascular exam Rhythm:regular Rate:Normal     Neuro/Psych PSYCHIATRIC DISORDERS Anxiety negative neurological ROS     GI/Hepatic negative GI ROS, Neg liver ROS,   Endo/Other  negative endocrine ROS  Renal/GU negative Renal ROS  negative genitourinary   Musculoskeletal   Abdominal   Peds  Hematology negative hematology ROS (+)   Anesthesia Other Findings Past Medical History: No date: Hypertension No date: Sickle cell trait ([place]) Past Surgical History: No date: ABDOMINAL HYSTERECTOMY 05/02/2018: BREAST BIOPSY; Right     Comment:  Affirm Bx- path pending - coil clip 08/07/2019: CYSTOSCOPY     Comment:  Procedure: CYSTOSCOPY;  Surgeon: Gae Dry, MD;                Location: [facility] ORS;  Service: Gynecology;; No date: DIAGNOSTIC LAPAROSCOPY 08/07/2019: TOTAL LAPAROSCOPIC HYSTERECTOMY WITH BILATERAL SALPINGO  OOPHORECTOMY; N/A     Comment:  Procedure: TOTAL LAPAROSCOPIC HYSTERECTOMY WITH               BILATERAL SALPINGO;  Surgeon: Gae Dry, MD;                Location: [facility] ORS;  Service: Gynecology;  Laterality:               N/A; 1995: TUBAL LIGATION   Reproductive/Obstetrics negative OB ROS                             Anesthesia Physical Anesthesia Plan  ASA: II  Anesthesia Plan: General   Post-op Pain Management:    Induction:   PONV Risk  Score and Plan:   Airway Management Planned:   Additional Equipment:   Intra-op Plan:   Post-operative Plan:   Informed Consent: I have reviewed the patients History and Physical, chart, labs and discussed the procedure including the risks, benefits and alternatives for the proposed anesthesia with the patient or authorized representative who has indicated his/her understanding and acceptance.     Dental Advisory Given  Plan Discussed with: CRNA  Anesthesia Plan Comments:         Anesthesia Quick Evaluation

## 2020-02-22 NOTE — Op Note (Signed)
[facility] Patient Name: Stephanie Jacobs Procedure Date: 02/22/2020 8:42 AM MRN: [id number] Account #: [id number] Date of Birth: Oct 07, 1966 Admit Type: Outpatient Age: 54 Room: [facility] ENDO ROOM 3 Gender: Female Note Status: Finalized Procedure:             Colonoscopy Indications:           Screening for colorectal malignant neoplasm Providers:             [name] MD, MD Referring MD:          Wendee Beavers. Terrilee Croak (Referring MD) Medicines:             Monitored Anesthesia Care Complications:         No immediate complications. Procedure:             Pre-Anesthesia Assessment:                        - Prior to the procedure, a History and Physical was                         performed, and patient medications, allergies and                         sensitivities were reviewed. The patient's tolerance                         of previous anesthesia was reviewed.                        - The risks and benefits of the procedure and the                         sedation options and risks were discussed with the                         patient. All questions were answered and informed                         consent was obtained.                        - ASA Grade Assessment: II - A patient with mild                         systemic disease.                        After obtaining informed consent, the colonoscope was                         passed under direct vision. Throughout the procedure,                         the patient's blood pressure, pulse, and oxygen                         saturations were monitored continuously. The                         Colonoscope was introduced through the anus and  advanced to the the cecum, identified by the                         appendiceal orifice. The colonoscopy was performed                         with ease. The patient tolerated the procedure well.                         The quality of  the bowel preparation was fair. Findings:      The perianal and digital rectal examinations were normal.      A 5 mm polyp was found in the cecum. The polyp was sessile. The polyp       was removed with a cold snare. Resection and retrieval were complete.      A 10 mm polyp was found in the proximal ascending colon. The polyp was       sessile. The polyp was removed with a cold snare. Resection and       retrieval were complete.      Multiple small-mouthed diverticula were found in the sigmoid colon.      There was a large lipoma, 20 mm in diameter, in the ascending colon.      The exam was otherwise without abnormality on direct and retroflexion       views. Impression:            - Preparation of the colon was fair.                        - One 5 mm polyp in the cecum, removed with a cold                         snare. Resected and retrieved.                        - One 10 mm polyp in the proximal ascending colon,                         removed with a cold snare. Resected and retrieved.                        - Diverticulosis in the sigmoid colon.                        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                        - The examination was otherwise normal on direct and                         retroflexion views. Recommendation:        - Discharge patient to home (with escort).                        - Resume previous diet.                        - Continue present medications.                        -  Await pathology results.                        - Repeat colonoscopy in 6 months because the bowel                         preparation was suboptimal. Procedure Code(s):     --- Professional ---                        [id number], Colonoscopy, flexible; with removal of                         tumor(s), polyp(s), or other lesion(s) by snare                         technique Diagnosis Code(s):     --- Professional ---                        Z12.11, Encounter for screening for  malignant neoplasm                         of colon                        K63.5, Polyp of colon                        D17.5, Benign lipomatous neoplasm of intra-abdominal                         organs                        K57.30, Diverticulosis of large intestine without                         perforation or abscess without bleeding CPT copyright 2019 American Medical Association. All rights reserved. The codes documented in this report are preliminary and upon coder review may  be revised to meet current compliance requirements. [name], MD [name] MD, MD 02/22/2020 9:11:51 AM This report has been signed electronically. Number of Addenda: 0 Note Initiated On: 02/22/2020 8:42 AM Scope Withdrawal Time: 0 hours 11 minutes 46 seconds  Total Procedure Duration: 0 hours 16 minutes 36 seconds  Estimated Blood Loss:  Estimated blood loss: none.      [facility]

## 2020-02-22 NOTE — Anesthesia Postprocedure Evaluation (Signed)
Anesthesia Post Note  Patient: Stephanie Jacobs  Procedure(s) Performed: COLONOSCOPY WITH PROPOFOL (N/A )  Patient location during evaluation: Endoscopy Anesthesia Type: General Level of consciousness: awake and alert Pain management: pain level controlled Vital Signs Assessment: post-procedure vital signs reviewed and stable Respiratory status: spontaneous breathing, nonlabored ventilation, respiratory function stable and patient connected to nasal cannula oxygen Cardiovascular status: blood pressure returned to baseline and stable Postop Assessment: no apparent nausea or vomiting Anesthetic complications: no     Last Vitals:  Vitals:   02/22/20 0929 02/22/20 0934  BP: 122/80 134/79  Pulse: 60 (!) 57  Resp: 14 12  Temp:    SpO2: 98% 98%    Last Pain:  Vitals:   02/22/20 0934  TempSrc:   PainSc: 0-No pain                 Arita Miss

## 2020-02-22 NOTE — Anesthesia Procedure Notes (Signed)
Date/Time: 02/22/2020 8:45 AM Performed by: Johnna Acosta, CRNA Pre-anesthesia Checklist: Patient identified, Emergency Drugs available, Suction available, Patient being monitored and Timeout performed Patient Re-evaluated:Patient Re-evaluated prior to induction Oxygen Delivery Method: Nasal cannula Preoxygenation: Pre-oxygenation with 100% oxygen Induction Type: IV induction

## 2020-02-25 ENCOUNTER — Encounter: Payer: Self-pay | Admitting: *Deleted

## 2020-02-25 DIAGNOSIS — M25561 Pain in right knee: Secondary | ICD-10-CM | POA: Diagnosis not present

## 2020-02-25 LAB — SURGICAL PATHOLOGY

## 2020-02-27 DIAGNOSIS — M25561 Pain in right knee: Secondary | ICD-10-CM | POA: Diagnosis not present

## 2020-03-04 ENCOUNTER — Encounter: Payer: Self-pay | Admitting: Gastroenterology

## 2020-03-11 ENCOUNTER — Ambulatory Visit
Admission: RE | Admit: 2020-03-11 | Discharge: 2020-03-11 | Disposition: A | Discharge status: Home / Self Care | Payer: BC Managed Care – PPO | Source: Ambulatory Visit | Attending: Physician Assistant | Admitting: Physician Assistant

## 2020-03-11 DIAGNOSIS — Z1231 Encounter for screening mammogram for malignant neoplasm of breast: Secondary | ICD-10-CM | POA: Diagnosis not present

## 2020-03-13 ENCOUNTER — Telehealth: Payer: Self-pay

## 2020-03-13 NOTE — Telephone Encounter (Signed)
-----   Message from Mar Daring, [place] sent at 03/13/2020 10:19 AM EDT ----- Normal mammogram. Repeat screening in one year.

## 2020-03-13 NOTE — Telephone Encounter (Signed)
Tried calling; no answer.  PEC please advise pt of mammogram result if she calls back.   Thanks,   -Mickel Baas

## 2020-03-14 NOTE — Telephone Encounter (Signed)
Call to patient- notified of MM result and recommendations

## 2020-04-07 DIAGNOSIS — S83209A Unspecified tear of unspecified meniscus, current injury, unspecified knee, initial encounter: Secondary | ICD-10-CM | POA: Diagnosis not present

## 2020-04-07 DIAGNOSIS — M25561 Pain in right knee: Secondary | ICD-10-CM | POA: Diagnosis not present

## 2020-04-07 DIAGNOSIS — M1711 Unilateral primary osteoarthritis, right knee: Secondary | ICD-10-CM | POA: Diagnosis not present

## 2020-04-08 NOTE — Progress Notes (Signed)
I,Roshena L Chambers,acting as a scribe for Trinna Post, PA-C.,have documented all relevant documentation on the behalf of Trinna Post, PA-C,as directed by  Trinna Post, PA-C while in the presence of Trinna Post, PA-C.  Established patient visit    Patient: Stephanie Jacobs   DOB: Jul 18, 1966   54 y.o. Female  MRN: [id number] Visit Date: 04/16/2020  Today's healthcare provider: Trinna Post, PA-C   Chief Complaint  Patient presents with  . Hypertension   Subjective    HPI   Hypertension, follow-up  BP Readings from Last 3 Encounters:  04/16/20 126/87  02/22/20 134/79  01/18/20 (!) 145/96   Wt Readings from Last 3 Encounters:  04/16/20 190 lb (86.2 kg)  02/22/20 203 lb 6.7 oz (92.3 kg)  01/18/20 203 lb 6.4 oz (92.3 kg)     She was last seen for hypertension on 01/18/2020, 3 months ago.  BP at that visit was 145/96 Management since that visit includes adding Metoprolol. She reports good compliance with treatment. She has lost 13 pounds since her last visit. She has been drinking 1/2 gallon of sassy water detox to help with weight loss. She has cut back on drinking sweet tea.   She is not having side effects.  She is following a Regular diet. She is not exercising. She does smoke 1/2 ppd.  Use of agents associated with hypertension: NSAIDS.   Outside blood pressures are not checked. Symptoms: No chest pain No chest pressure/discomfort No dyspnea (difficulty breathing) No lower extremity edema No orthopnea  No palpitations No paroxysmal nocturnal dyspnea  No syncope  Pertinent labs: Lab Results  Component Value Date   CHOL 196 01/18/2020   HDL 67 01/18/2020   LDLCALC 114 (H) 01/18/2020   TRIG 85 01/18/2020   CHOLHDL 2.9 01/18/2020   Lab Results  Component Value Date   NA 136 01/18/2020   K 3.9 01/18/2020   CO2 24 01/18/2020   GLUCOSE 76 01/18/2020   BUN 12 01/18/2020   CREATININE 0.75 01/18/2020   CALCIUM 9.7 01/18/2020   GFRNONAA 93 01/18/2020   GFRAA 107 01/18/2020     The 10-year ASCVD risk score Mikey Bussing DC Jr., et al., 2013) is: 5.2%   ---------------------------------------------------------------------------------------------------  Possible Exposure to STI: Patient request testing to be checked for STI. She denies any symptoms. Patient recently learned her husband has additional sexual partner. Discussed counseling, but she declined at this point.      Medications: Outpatient Medications Prior to Visit  Medication Sig  . amLODipine (NORVASC) 10 MG tablet Take 1 tablet (10 mg total) by mouth daily.  [name] aspirin 81 MG tablet Take 81 mg by mouth daily.   . diclofenac Sodium (VOLTAREN) 1 % GEL Apply 4 g topically 4 (four) times daily.  . hydrochlorothiazide (HYDRODIURIL) 25 MG tablet TAKE 1 TABLET BY MOUTH EVERY DAY  . metoprolol tartrate (LOPRESSOR) 25 MG tablet Take 1/2 tablet twice a day. If BP is still > 120/80 after two weeks, then increase to 1 tablet twice daily.  . Multiple Vitamin (MULTIVITAMIN) tablet Take 1 tablet by mouth daily.  . [DISCONTINUED] losartan (COZAAR) 100 MG tablet Take 1 tablet (100 mg total) by mouth daily.   No facility-administered medications prior to visit.    Review of Systems  Constitutional: Negative for appetite change, chills, fatigue and fever.  Respiratory: Negative for chest tightness and shortness of breath.   Cardiovascular: Negative for chest pain and palpitations.  Gastrointestinal: Negative for abdominal  pain, nausea and vomiting.  Neurological: Negative for dizziness and weakness.       Objective    BP 126/87 (BP Location: Left Arm, Patient Position: Sitting, Cuff Size: Large)   Pulse 75   Temp (!) 96.9 F (36.1 C) (Temporal)   Resp 16   Wt 190 lb (86.2 kg)   LMP 07/21/2019 (Exact Date)   SpO2 99% Comment: room air  BMI 33.66 kg/m    Physical Exam Constitutional:      Appearance: Normal appearance.  Cardiovascular:     Rate and Rhythm:  Normal rate and regular rhythm.     Heart sounds: Normal heart sounds.  Pulmonary:     Effort: Pulmonary effort is normal.     Breath sounds: Normal breath sounds.  Skin:    General: Skin is warm and dry.  Neurological:     Mental Status: She is alert and oriented to person, place, and time. Mental status is at baseline.  Psychiatric:        Mood and Affect: Mood normal.        Behavior: Behavior normal.       No results found for any visits on 04/16/20.   Assessment & Plan    1. Essential (primary) hypertension Well controlled Continue current medications F/u in 6 months - losartan (COZAAR) 100 MG tablet; Take 1 tablet (100 mg total) by mouth daily.  Dispense: 90 tablet; Refill: 1  2. Encounter for screening for HIV - HIV Antibody (routine testing w rflx)  3. High risk sexual behavior, unspecified type - HIV Antibody (routine testing w rflx) - Urine cytology ancillary only - RPR - Hepatitis c antibody (reflex)  Return in about 6 months (around 10/16/2020) for Follow up HTN.      ITrinna Post, PA-C, have reviewed all documentation for this visit. The documentation on 04/16/20 for the exam, diagnosis, procedures, and orders are all accurate and complete.    Paulene Floor  [facility] [phone] (phone) [phone] (fax)  [place]

## 2020-04-10 DIAGNOSIS — M1711 Unilateral primary osteoarthritis, right knee: Secondary | ICD-10-CM | POA: Diagnosis not present

## 2020-04-10 DIAGNOSIS — M25561 Pain in right knee: Secondary | ICD-10-CM | POA: Diagnosis not present

## 2020-04-16 ENCOUNTER — Other Ambulatory Visit (HOSPITAL_COMMUNITY)
Admission: RE | Admit: 2020-04-16 | Discharge: 2020-04-16 | Disposition: A | Discharge status: Home / Self Care | Payer: BC Managed Care – PPO | Source: Ambulatory Visit | Attending: Physician Assistant | Admitting: Physician Assistant

## 2020-04-16 ENCOUNTER — Other Ambulatory Visit: Payer: Self-pay

## 2020-04-16 ENCOUNTER — Ambulatory Visit: Payer: BC Managed Care – PPO | Admitting: Physician Assistant

## 2020-04-16 ENCOUNTER — Encounter: Payer: Self-pay | Admitting: Physician Assistant

## 2020-04-16 VITALS — BP 126/87 | HR 75 | Temp 96.9°F | Resp 16 | Wt 190.0 lb

## 2020-04-16 DIAGNOSIS — Z114 Encounter for screening for human immunodeficiency virus [HIV]: Secondary | ICD-10-CM | POA: Diagnosis not present

## 2020-04-16 DIAGNOSIS — I10 Essential (primary) hypertension: Secondary | ICD-10-CM | POA: Diagnosis not present

## 2020-04-16 DIAGNOSIS — Z7251 High risk heterosexual behavior: Secondary | ICD-10-CM | POA: Diagnosis not present

## 2020-04-16 MED ORDER — LOSARTAN POTASSIUM 100 MG PO TABS: 100.0000 mg | ORAL_TABLET | Freq: Every day | ORAL | 1 refills | Status: DC

## 2020-04-17 ENCOUNTER — Telehealth: Payer: Self-pay | Admitting: Physician Assistant

## 2020-04-17 LAB — RPR: RPR Ser Ql: NONREACTIVE

## 2020-04-17 LAB — URINE CYTOLOGY ANCILLARY ONLY
Chlamydia: NEGATIVE
Comment: NEGATIVE
Comment: NEGATIVE
Comment: NORMAL
Neisseria Gonorrhea: NEGATIVE
Trichomonas: NEGATIVE

## 2020-04-17 LAB — HCV COMMENT:

## 2020-04-17 LAB — HIV ANTIBODY (ROUTINE TESTING W REFLEX): HIV Screen 4th Generation wRfx: NONREACTIVE

## 2020-04-17 LAB — HEPATITIS C ANTIBODY (REFLEX): HCV Ab: <0.1 s/co ratio (ref 0.0–0.9)

## 2020-04-17 NOTE — Telephone Encounter (Signed)
Patient would like to discuss recent lab results

## 2020-04-17 NOTE — Telephone Encounter (Signed)
Patient wanted to know what <0.1 for Hep C screening meant. Patient advised test was negative.

## 2020-04-21 DIAGNOSIS — M25561 Pain in right knee: Secondary | ICD-10-CM | POA: Diagnosis not present

## 2020-04-21 DIAGNOSIS — M1711 Unilateral primary osteoarthritis, right knee: Secondary | ICD-10-CM | POA: Diagnosis not present

## 2020-04-23 DIAGNOSIS — M25561 Pain in right knee: Secondary | ICD-10-CM | POA: Diagnosis not present

## 2020-04-23 DIAGNOSIS — M1711 Unilateral primary osteoarthritis, right knee: Secondary | ICD-10-CM | POA: Diagnosis not present

## 2020-05-02 DIAGNOSIS — M1711 Unilateral primary osteoarthritis, right knee: Secondary | ICD-10-CM | POA: Diagnosis not present

## 2020-05-02 DIAGNOSIS — M25561 Pain in right knee: Secondary | ICD-10-CM | POA: Diagnosis not present

## 2020-05-07 DIAGNOSIS — M1711 Unilateral primary osteoarthritis, right knee: Secondary | ICD-10-CM | POA: Diagnosis not present

## 2020-05-07 DIAGNOSIS — S83411D Sprain of medial collateral ligament of right knee, subsequent encounter: Secondary | ICD-10-CM | POA: Diagnosis not present

## 2020-05-07 DIAGNOSIS — S83411A Sprain of medial collateral ligament of right knee, initial encounter: Secondary | ICD-10-CM | POA: Diagnosis not present

## 2020-05-08 DIAGNOSIS — M25561 Pain in right knee: Secondary | ICD-10-CM | POA: Diagnosis not present

## 2020-05-08 DIAGNOSIS — M1711 Unilateral primary osteoarthritis, right knee: Secondary | ICD-10-CM | POA: Diagnosis not present

## 2020-05-15 DIAGNOSIS — M25561 Pain in right knee: Secondary | ICD-10-CM | POA: Diagnosis not present

## 2020-05-15 DIAGNOSIS — M1711 Unilateral primary osteoarthritis, right knee: Secondary | ICD-10-CM | POA: Diagnosis not present

## 2020-05-22 DIAGNOSIS — M25561 Pain in right knee: Secondary | ICD-10-CM | POA: Diagnosis not present

## 2020-05-22 DIAGNOSIS — M1711 Unilateral primary osteoarthritis, right knee: Secondary | ICD-10-CM | POA: Diagnosis not present

## 2020-06-02 ENCOUNTER — Other Ambulatory Visit: Payer: Self-pay | Admitting: Physician Assistant

## 2020-06-02 DIAGNOSIS — I10 Essential (primary) hypertension: Secondary | ICD-10-CM

## 2020-06-06 DIAGNOSIS — M1711 Unilateral primary osteoarthritis, right knee: Secondary | ICD-10-CM | POA: Diagnosis not present

## 2020-07-11 DIAGNOSIS — S83411D Sprain of medial collateral ligament of right knee, subsequent encounter: Secondary | ICD-10-CM | POA: Diagnosis not present

## 2020-07-11 DIAGNOSIS — M1711 Unilateral primary osteoarthritis, right knee: Secondary | ICD-10-CM | POA: Diagnosis not present

## 2020-07-11 DIAGNOSIS — S83231A Complex tear of medial meniscus, current injury, right knee, initial encounter: Secondary | ICD-10-CM | POA: Diagnosis not present

## 2020-08-04 DIAGNOSIS — S83231A Complex tear of medial meniscus, current injury, right knee, initial encounter: Secondary | ICD-10-CM | POA: Diagnosis not present

## 2020-08-05 ENCOUNTER — Other Ambulatory Visit: Payer: Self-pay | Admitting: Orthopedic Surgery

## 2020-08-13 ENCOUNTER — Other Ambulatory Visit: Payer: Self-pay

## 2020-08-13 ENCOUNTER — Encounter
Admission: RE | Admit: 2020-08-13 | Discharge: 2020-08-13 | Disposition: A | Discharge status: Home / Self Care | Payer: BC Managed Care – PPO | Source: Ambulatory Visit | Attending: Orthopedic Surgery | Admitting: Orthopedic Surgery

## 2020-08-13 DIAGNOSIS — I10 Essential (primary) hypertension: Secondary | ICD-10-CM | POA: Diagnosis not present

## 2020-08-13 DIAGNOSIS — Z01818 Encounter for other preprocedural examination: Secondary | ICD-10-CM | POA: Insufficient documentation

## 2020-08-13 NOTE — Patient Instructions (Signed)
Your procedure is scheduled on: Thursday 08/21/20.  Report to DAY SURGERY DEPARTMENT LOCATED ON 2ND FLOOR MEDICAL MALL ENTRANCE. To find out your arrival time please call [phone] between 1PM - 3PM on Wednesday 08/20/20.   Remember: Instructions that are not followed completely may result in serious medical risk, up to and including death, or upon the discretion of your surgeon and anesthesiologist your surgery may need to be rescheduled.     __X__ 1. Do not eat food after midnight the night before your procedure.                 No gum chewing or hard candies. You may drink clear liquids up to 2 hours                 before you are scheduled to arrive for your surgery- DO NOT drink clear                 liquids within 2 hours of the start of your surgery.                 Clear Liquids include:  water, apple juice without pulp, clear carbohydrate                 drink such as Clearfast or Gatorade, Black Coffee or Tea (Do not add                 milk or creamer to coffee or tea).   __X__2.  On the morning of surgery brush your teeth with toothpaste and water, you may rinse your mouth with mouthwash if you wish.  Do not swallow any toothpaste or mouthwash.    __X__ 3.  No Alcohol for 24 hours before or after surgery.  __X__ 4.  Do Not Smoke or use e-cigarettes For 24 Hours Prior to Your Surgery.                 Do not use any chewable tobacco products for at least 6 hours prior to                 surgery.  __X__5.  Notify your doctor if there is any change in your medical condition      (cold, fever, infections).      Do NOT wear jewelry, make-up, hairpins, clips or nail polish. Do NOT wear lotions, powders, or perfumes.  Do NOT shave 48 hours prior to surgery. Men may shave face and neck. Do NOT bring valuables to the hospital.     [facility] is not responsible for any belongings or valuables.   Contacts, dentures/partials or body piercings may not be worn into surgery.  Bring a case for your contacts, glasses or hearing aids, a denture cup will be supplied.    Patients discharged the day of surgery will not be allowed to drive home.     __X__ Take these medicines the morning of surgery with A SIP OF WATER:     1. amLODipine (NORVASC)  2. metoprolol tartrate (LOPRESSOR)      __X__ Use CHG Soap as directed  __X__ Stop Anti-inflammatories 7 days before surgery such as Advil, Ibuprofen, Motrin, BC or Goodies Powder, Naprosyn, Naproxen, Aleve, Aspirin, Meloxicam. May take Tylenol if needed for pain or discomfort.   __X__Do not start taking any new herbal supplements or vitamins prior to your procedure.    Wear comfortable clothing (specific to your surgery type) to the hospital.  Plan for stool  softeners for home use; pain medications have a tendency to cause constipation. You can also help prevent constipation by eating foods high in fiber such as fruits and vegetables and drinking plenty of fluids as your diet allows.  After surgery, you can prevent lung complications by doing breathing exercises.Take deep breaths and cough every 1-2 hours. Your doctor may order a device called an Incentive Spirometer to help you take deep breaths.  Please call the [place] Department at [phone] if you have any questions about these instructions.

## 2020-08-14 ENCOUNTER — Other Ambulatory Visit: Payer: Self-pay | Admitting: Orthopedic Surgery

## 2020-08-19 ENCOUNTER — Encounter
Admission: RE | Admit: 2020-08-19 | Discharge: 2020-08-19 | Disposition: A | Discharge status: Home / Self Care | Payer: BC Managed Care – PPO | Source: Ambulatory Visit | Attending: Orthopedic Surgery | Admitting: Orthopedic Surgery

## 2020-08-19 ENCOUNTER — Other Ambulatory Visit: Payer: Self-pay

## 2020-08-19 DIAGNOSIS — Z01818 Encounter for other preprocedural examination: Secondary | ICD-10-CM | POA: Diagnosis not present

## 2020-08-19 DIAGNOSIS — Z20822 Contact with and (suspected) exposure to covid-19: Secondary | ICD-10-CM | POA: Diagnosis not present

## 2020-08-19 DIAGNOSIS — I10 Essential (primary) hypertension: Secondary | ICD-10-CM | POA: Diagnosis not present

## 2020-08-19 LAB — CBC WITH DIFFERENTIAL/PLATELET
Abs Immature Granulocytes: 0.03 10*3/uL (ref 0.00–0.07)
Basophils Absolute: 0 10*3/uL (ref 0.0–0.1)
Basophils Relative: 0 %
Eosinophils Absolute: 0.1 10*3/uL (ref 0.0–0.5)
Eosinophils Relative: 1 %
HCT: 44.6 % (ref 36.0–46.0)
Hemoglobin: 15.9 g/dL — ABNORMAL HIGH (ref 12.0–15.0)
Immature Granulocytes: 0 %
Lymphocytes Relative: 28 %
Lymphs Abs: 2.1 10*3/uL (ref 0.7–4.0)
MCH: 31.7 pg (ref 26.0–34.0)
MCHC: 35.7 g/dL (ref 30.0–36.0)
MCV: 88.8 fL (ref 80.0–100.0)
Monocytes Absolute: 0.5 10*3/uL (ref 0.1–1.0)
Monocytes Relative: 7 %
Neutro Abs: 4.8 10*3/uL (ref 1.7–7.7)
Neutrophils Relative %: 64 %
Platelets: 317 10*3/uL (ref 150–400)
RBC: 5.02 MIL/uL (ref 3.87–5.11)
RDW: 13 % (ref 11.5–15.5)
WBC: 7.6 10*3/uL (ref 4.0–10.5)
nRBC: 0 % (ref 0.0–0.2)

## 2020-08-19 LAB — BASIC METABOLIC PANEL
Anion gap: 13 (ref 5–15)
BUN: 7 mg/dL (ref 6–20)
CO2: 26 mmol/L (ref 22–32)
Calcium: 9.4 mg/dL (ref 8.9–10.3)
Chloride: 99 mmol/L (ref 98–111)
Creatinine, Ser: 0.51 mg/dL (ref 0.44–1.00)
GFR calc Af Amer: >60 mL/min (ref >60)
GFR calc non Af Amer: >60 mL/min (ref >60)
Glucose, Bld: 101 mg/dL — ABNORMAL HIGH (ref 70–99)
Potassium: 3.8 mmol/L (ref 3.5–5.1)
Sodium: 138 mmol/L (ref 135–145)

## 2020-08-19 LAB — SARS CORONAVIRUS 2 (TAT 6-24 HRS): SARS Coronavirus 2: NEGATIVE

## 2020-08-19 LAB — PROTIME-INR
INR: 1 (ref 0.8–1.2)
Prothrombin Time: 12.6 seconds (ref 11.4–15.2)

## 2020-08-19 LAB — APTT: aPTT: 29 seconds (ref 24–36)

## 2020-08-21 ENCOUNTER — Ambulatory Visit
Admission: RE | Admit: 2020-08-21 | Discharge: 2020-08-21 | Disposition: A | Discharge status: Home / Self Care | Payer: BC Managed Care – PPO | Attending: Orthopedic Surgery | Admitting: Orthopedic Surgery

## 2020-08-21 ENCOUNTER — Encounter: Payer: Self-pay | Admitting: Orthopedic Surgery

## 2020-08-21 ENCOUNTER — Encounter
Admission: RE | Disposition: A | Discharge status: Home / Self Care | Payer: Self-pay | Source: Home / Self Care | Attending: Orthopedic Surgery

## 2020-08-21 ENCOUNTER — Ambulatory Visit: Payer: BC Managed Care – PPO | Admitting: Anesthesiology

## 2020-08-21 ENCOUNTER — Other Ambulatory Visit: Payer: Self-pay

## 2020-08-21 DIAGNOSIS — F419 Anxiety disorder, unspecified: Secondary | ICD-10-CM | POA: Diagnosis not present

## 2020-08-21 DIAGNOSIS — M94261 Chondromalacia, right knee: Secondary | ICD-10-CM | POA: Diagnosis not present

## 2020-08-21 DIAGNOSIS — Z791 Long term (current) use of non-steroidal anti-inflammatories (NSAID): Secondary | ICD-10-CM | POA: Diagnosis not present

## 2020-08-21 DIAGNOSIS — Z7982 Long term (current) use of aspirin: Secondary | ICD-10-CM | POA: Insufficient documentation

## 2020-08-21 DIAGNOSIS — Z79899 Other long term (current) drug therapy: Secondary | ICD-10-CM | POA: Insufficient documentation

## 2020-08-21 DIAGNOSIS — D573 Sickle-cell trait: Secondary | ICD-10-CM | POA: Diagnosis not present

## 2020-08-21 DIAGNOSIS — I10 Essential (primary) hypertension: Secondary | ICD-10-CM | POA: Diagnosis not present

## 2020-08-21 DIAGNOSIS — X58XXXA Exposure to other specified factors, initial encounter: Secondary | ICD-10-CM | POA: Insufficient documentation

## 2020-08-21 DIAGNOSIS — F17210 Nicotine dependence, cigarettes, uncomplicated: Secondary | ICD-10-CM | POA: Diagnosis not present

## 2020-08-21 DIAGNOSIS — S83241A Other tear of medial meniscus, current injury, right knee, initial encounter: Secondary | ICD-10-CM | POA: Diagnosis not present

## 2020-08-21 DIAGNOSIS — S83231A Complex tear of medial meniscus, current injury, right knee, initial encounter: Secondary | ICD-10-CM | POA: Diagnosis not present

## 2020-08-21 HISTORY — PX: KNEE ARTHROSCOPY WITH MEDIAL MENISECTOMY: SHX5651

## 2020-08-21 SURGERY — ARTHROSCOPY, KNEE, WITH MEDIAL MENISCECTOMY
Anesthesia: General | Site: Knee | Laterality: Right

## 2020-08-21 MED ORDER — DEXAMETHASONE SODIUM PHOSPHATE 10 MG/ML IJ SOLN
INTRAMUSCULAR | Status: DC | PRN
  Administered 2020-08-21: 10 mg via INTRAVENOUS

## 2020-08-21 MED ORDER — OXYCODONE HCL 5 MG PO TABS: 5.0000 mg | ORAL_TABLET | Freq: Once | ORAL | Status: DC | PRN

## 2020-08-21 MED ORDER — CHLORHEXIDINE GLUCONATE 0.12 % MT SOLN: 15.0000 mL | Freq: Once | OROMUCOSAL | Status: AC

## 2020-08-21 MED ORDER — LIDOCAINE HCL (CARDIAC) PF 100 MG/5ML IV SOSY
PREFILLED_SYRINGE | INTRAVENOUS | Status: DC | PRN
  Administered 2020-08-21: 40 mg via INTRAVENOUS

## 2020-08-21 MED ORDER — BUPIVACAINE-EPINEPHRINE (PF) 0.25% -1:200000 IJ SOLN
INTRAMUSCULAR | Status: DC | PRN
  Administered 2020-08-21: 30 mL via PERINEURAL

## 2020-08-21 MED ORDER — FENTANYL CITRATE (PF) 100 MCG/2ML IJ SOLN: 25.0000 ug | INTRAMUSCULAR | Status: DC | PRN

## 2020-08-21 MED ORDER — FENTANYL CITRATE (PF) 100 MCG/2ML IJ SOLN
INTRAMUSCULAR | Status: AC
  Filled 2020-08-21: qty 2

## 2020-08-21 MED ORDER — ACETAMINOPHEN 10 MG/ML IV SOLN
INTRAVENOUS | Status: AC
  Filled 2020-08-21: qty 100

## 2020-08-21 MED ORDER — BUPIVACAINE HCL (PF) 0.25 % IJ SOLN
INTRAMUSCULAR | Status: AC
  Filled 2020-08-21: qty 30

## 2020-08-21 MED ORDER — HYDROCODONE-ACETAMINOPHEN 5-325 MG PO TABS
1.0000 | ORAL_TABLET | ORAL | 0 refills | Status: DC | PRN
Start: 2020-08-21 — End: 2021-05-28

## 2020-08-21 MED ORDER — EPINEPHRINE PF 1 MG/ML IJ SOLN
INTRAMUSCULAR | Status: AC
  Filled 2020-08-21: qty 1

## 2020-08-21 MED ORDER — EPHEDRINE SULFATE 50 MG/ML IJ SOLN
INTRAMUSCULAR | Status: DC | PRN
  Administered 2020-08-21 (×2): 10 mg via INTRAVENOUS

## 2020-08-21 MED ORDER — OXYCODONE HCL 5 MG/5ML PO SOLN: 5.0000 mg | Freq: Once | ORAL | Status: DC | PRN

## 2020-08-21 MED ORDER — GLYCOPYRROLATE 0.2 MG/ML IJ SOLN
INTRAMUSCULAR | Status: DC | PRN
  Administered 2020-08-21: .2 mg via INTRAVENOUS

## 2020-08-21 MED ORDER — ONDANSETRON HCL 4 MG/2ML IJ SOLN: 4.0000 mg | Freq: Once | INTRAMUSCULAR | Status: DC | PRN

## 2020-08-21 MED ORDER — FENTANYL CITRATE (PF) 250 MCG/5ML IJ SOLN
INTRAMUSCULAR | Status: AC
  Filled 2020-08-21: qty 5

## 2020-08-21 MED ORDER — CHLORHEXIDINE GLUCONATE CLOTH 2 % EX PADS: 6.0000 | MEDICATED_PAD | Freq: Once | CUTANEOUS | Status: DC

## 2020-08-21 MED ORDER — FAMOTIDINE 20 MG PO TABS: 20.0000 mg | ORAL_TABLET | Freq: Once | ORAL | Status: DC

## 2020-08-21 MED ORDER — MIDAZOLAM HCL 2 MG/2ML IJ SOLN
INTRAMUSCULAR | Status: AC
  Filled 2020-08-21: qty 2

## 2020-08-21 MED ORDER — MIDAZOLAM HCL 2 MG/2ML IJ SOLN
INTRAMUSCULAR | Status: DC | PRN
  Administered 2020-08-21: 2 mg via INTRAVENOUS

## 2020-08-21 MED ORDER — ONDANSETRON HCL 4 MG PO TABS: 4.0000 mg | ORAL_TABLET | Freq: Three times a day (TID) | ORAL | 0 refills | Status: DC | PRN

## 2020-08-21 MED ORDER — CEFAZOLIN SODIUM-DEXTROSE 2-4 GM/100ML-% IV SOLN
INTRAVENOUS | Status: AC
  Filled 2020-08-21: qty 100

## 2020-08-21 MED ORDER — LIDOCAINE HCL (PF) 1 % IJ SOLN
INTRAMUSCULAR | Status: DC | PRN
  Administered 2020-08-21: 5 mL

## 2020-08-21 MED ORDER — GLYCOPYRROLATE 0.2 MG/ML IJ SOLN
INTRAMUSCULAR | Status: AC
  Filled 2020-08-21: qty 1

## 2020-08-21 MED ORDER — METOCLOPRAMIDE HCL 5 MG/ML IJ SOLN
INTRAMUSCULAR | Status: AC
  Filled 2020-08-21: qty 2

## 2020-08-21 MED ORDER — SUCCINYLCHOLINE CHLORIDE 20 MG/ML IJ SOLN
INTRAMUSCULAR | Status: DC | PRN
  Administered 2020-08-21: 140 mg via INTRAVENOUS

## 2020-08-21 MED ORDER — CHLORHEXIDINE GLUCONATE 0.12 % MT SOLN
OROMUCOSAL | Status: AC
  Administered 2020-08-21: 15 mL via OROMUCOSAL
  Filled 2020-08-21: qty 15

## 2020-08-21 MED ORDER — PROPOFOL 10 MG/ML IV BOLUS
INTRAVENOUS | Status: AC
  Filled 2020-08-21: qty 60

## 2020-08-21 MED ORDER — BUPIVACAINE-EPINEPHRINE (PF) 0.25% -1:200000 IJ SOLN
INTRAMUSCULAR | Status: AC
  Filled 2020-08-21: qty 10

## 2020-08-21 MED ORDER — METOCLOPRAMIDE HCL 5 MG/ML IJ SOLN
INTRAMUSCULAR | Status: DC | PRN
  Administered 2020-08-21: 10 mg via INTRAVENOUS

## 2020-08-21 MED ORDER — ORAL CARE MOUTH RINSE: 15.0000 mL | Freq: Once | OROMUCOSAL | Status: AC

## 2020-08-21 MED ORDER — LACTATED RINGERS IV SOLN: INTRAVENOUS | Status: DC

## 2020-08-21 MED ORDER — CEFAZOLIN SODIUM-DEXTROSE 2-4 GM/100ML-% IV SOLN
2.0000 g | INTRAVENOUS | Status: AC
  Administered 2020-08-21: 2 g via INTRAVENOUS

## 2020-08-21 MED ORDER — ONDANSETRON HCL 4 MG/2ML IJ SOLN
INTRAMUSCULAR | Status: DC | PRN
  Administered 2020-08-21: 4 mg via INTRAVENOUS

## 2020-08-21 MED ORDER — ASPIRIN EC 325 MG PO TBEC: 325.0000 mg | DELAYED_RELEASE_TABLET | Freq: Every day | ORAL | 0 refills | Status: DC

## 2020-08-21 MED ORDER — PROPOFOL 10 MG/ML IV BOLUS
INTRAVENOUS | Status: DC | PRN
  Administered 2020-08-21: 150 mg via INTRAVENOUS

## 2020-08-21 MED ORDER — FENTANYL CITRATE (PF) 100 MCG/2ML IJ SOLN
INTRAMUSCULAR | Status: DC | PRN
  Administered 2020-08-21: 50 ug via INTRAVENOUS
  Administered 2020-08-21: 100 ug via INTRAVENOUS

## 2020-08-21 MED ORDER — FAMOTIDINE 20 MG PO TABS
ORAL_TABLET | ORAL | Status: AC
  Filled 2020-08-21: qty 1

## 2020-08-21 MED ORDER — LIDOCAINE HCL (PF) 1 % IJ SOLN
INTRAMUSCULAR | Status: AC
  Filled 2020-08-21: qty 30

## 2020-08-21 SURGICAL SUPPLY — 34 items
BUR RADIUS 3.5 (BURR) ×2 IMPLANT
BUR RADIUS 4.0X18.5 (BURR) ×2 IMPLANT
COVER WAND RF STERILE (DRAPES) ×2 IMPLANT
CUFF TOURN 24 STER (MISCELLANEOUS) IMPLANT
CUFF TOURN 30 STER DUAL PORT (MISCELLANEOUS) IMPLANT
DRAPE IMP U-DRAPE 54X76 (DRAPES) ×2 IMPLANT
DURAPREP 26ML APPLICATOR (WOUND CARE) ×6 IMPLANT
GAUZE SPONGE 4X4 12PLY STRL (GAUZE/BANDAGES/DRESSINGS) ×2 IMPLANT
GAUZE XEROFORM 1X8 LF (GAUZE/BANDAGES/DRESSINGS) ×2 IMPLANT
GLOVE BIOGEL PI IND STRL 9 (GLOVE) ×1 IMPLANT
GLOVE BIOGEL PI INDICATOR 9 (GLOVE) ×1
GLOVE SURG 9.0 ORTHO LTXF (GLOVE) ×4 IMPLANT
GOWN STRL REUS W/ TWL LRG LVL3 (GOWN DISPOSABLE) ×1 IMPLANT
GOWN STRL REUS W/TWL 2XL LVL3 (GOWN DISPOSABLE) ×2 IMPLANT
GOWN STRL REUS W/TWL LRG LVL3 (GOWN DISPOSABLE) ×2
IV LACTATED RINGER IRRG 3000ML (IV SOLUTION) ×12
IV LR IRRIG 3000ML ARTHROMATIC (IV SOLUTION) ×6 IMPLANT
KIT TURNOVER KIT A (KITS) ×2 IMPLANT
MANIFOLD NEPTUNE II (INSTRUMENTS) ×2 IMPLANT
MAT ABSORB  FLUID 56X50 GRAY (MISCELLANEOUS) ×2
MAT ABSORB FLUID 56X50 GRAY (MISCELLANEOUS) ×1 IMPLANT
NEEDLE HYPO 22GX1.5 SAFETY (NEEDLE) ×2 IMPLANT
PACK KNEE ARTHRO (MISCELLANEOUS) ×2 IMPLANT
PAD ABD DERMACEA PRESS 5X9 (GAUZE/BANDAGES/DRESSINGS) ×4 IMPLANT
SET TUBE SUCT SHAVER OUTFL 24K (TUBING) ×2 IMPLANT
SET TUBE TIP INTRA-ARTICULAR (MISCELLANEOUS) ×2 IMPLANT
SOL PREP PVP 2OZ (MISCELLANEOUS) ×2
SOLUTION PREP PVP 2OZ (MISCELLANEOUS) ×1 IMPLANT
STRIP CLOSURE SKIN 1/2X4 (GAUZE/BANDAGES/DRESSINGS) ×2 IMPLANT
SUT ETHILON 4-0 (SUTURE) ×2
SUT ETHILON 4-0 FS2 18XMFL BLK (SUTURE) ×1
SUTURE ETHLN 4-0 FS2 18XMF BLK (SUTURE) ×1 IMPLANT
TUBING ARTHRO INFLOW-ONLY STRL (TUBING) ×2 IMPLANT
WAND HAND CNTRL MULTIVAC 90 (MISCELLANEOUS) ×2 IMPLANT

## 2020-08-21 NOTE — Progress Notes (Signed)
[employer] came to deliever walker. Patient unable to provide credit card, but stated she would pick up walker at the store once insurance has been processed.

## 2020-08-21 NOTE — H&P (Addendum)
PREOPERATIVE H&P  Chief Complaint: Complex Tear of Medial Meniscus, right knee  HPI: Stephanie Jacobs is a 54 y.o. female who presents for preoperative history and physical with a diagnosis of Complex Tear of Medial Meniscus. Symptoms of pain and mechanical symptoms are significantly impairing activities of daily living.  MRI has confirmed a complex tear of the medial meniscus.  Patient has failed nonoperative management and has elected to proceed with arthroscopic partial medial meniscectomy.   Past Medical History:  Diagnosis Date  . Hypertension   . Sickle cell trait [facility])    Past Surgical History:  Procedure Laterality Date  . ABDOMINAL HYSTERECTOMY    . BREAST BIOPSY Right 05/02/2018   Affirm Bx- Fibrocystic breast tissue - coil clip  . COLONOSCOPY WITH PROPOFOL N/A 02/22/2020   Procedure: COLONOSCOPY WITH PROPOFOL;  Surgeon: [name], MD;  Location: [facility] ENDOSCOPY;  Service: Gastroenterology;  Laterality: N/A;  . CYSTOSCOPY  08/07/2019   Procedure: CYSTOSCOPY;  Surgeon: Gae Dry, MD;  Location: [facility] ORS;  Service: Gynecology;;  . DIAGNOSTIC LAPAROSCOPY    . TOTAL LAPAROSCOPIC HYSTERECTOMY WITH BILATERAL SALPINGO OOPHORECTOMY N/A 08/07/2019   Procedure: TOTAL LAPAROSCOPIC HYSTERECTOMY WITH BILATERAL SALPINGO;  Surgeon: Gae Dry, MD;  Location: [facility] ORS;  Service: Gynecology;  Laterality: N/A;  . TUBAL LIGATION  1995   Social History   Socioeconomic History  . Marital status: Married    Spouse name: Not on file  . Number of children: Not on file  . Years of education: Not on file  . Highest education level: Not on file  Occupational History  . Not on file  Tobacco Use  . Smoking status: Current Every Day Smoker    Packs/day: 0.50    Types: Cigarettes  . Smokeless tobacco: Never Used  Vaping Use  . Vaping Use: Never used  Substance and Sexual Activity  . Alcohol use: Yes    Alcohol/week: 7.0 standard drinks    Types: 7 Glasses of wine per week     Comment: One glass of red wine in the evenings.  . Drug use: No  . Sexual activity: Yes  Other Topics Concern  . Not on file  Social History Narrative  . Not on file   Social Determinants of Health   Financial Resource Strain:   . Difficulty of Paying Living Expenses: Not on file  Food Insecurity:   . Worried About Charity fundraiser in the Last Year: Not on file  . Ran Out of Food in the Last Year: Not on file  Transportation Needs:   . Lack of Transportation (Medical): Not on file  . Lack of Transportation (Non-Medical): Not on file  Physical Activity:   . Days of Exercise per Week: Not on file  . Minutes of Exercise per Session: Not on file  Stress:   . Feeling of Stress : Not on file  Social Connections:   . Frequency of Communication with Friends and Family: Not on file  . Frequency of Social Gatherings with Friends and Family: Not on file  . Attends Religious Services: Not on file  . Active Member of Clubs or Organizations: Not on file  . Attends Archivist Meetings: Not on file  . Marital Status: Not on file   Family History  Problem Relation Age of Onset  . Hypertension Mother   . Diabetes Mother   . Sickle cell anemia Brother   . Sickle cell trait Brother   . Breast cancer Neg Hx  Allergies  Allergen Reactions  . Tape Rash   Prior to Admission medications   Medication Sig Start Date End Date Taking? Authorizing Provider  amLODipine (NORVASC) 10 MG tablet TAKE 1 TABLET BY MOUTH EVERY DAY 06/02/20  Yes Carles Collet M, PA-C  diclofenac Sodium (VOLTAREN) 1 % GEL Apply 4 g topically 4 (four) times daily. Patient taking differently: Apply 4 g topically 3 (three) times daily as needed (pain).  01/18/20  Yes Carles Collet M, PA-C  hydrochlorothiazide (HYDRODIURIL) 25 MG tablet TAKE 1 TABLET BY MOUTH EVERY DAY Patient taking differently: Take 25 mg by mouth daily.  06/02/20  Yes Carles Collet M, PA-C  losartan (COZAAR) 100 MG tablet Take 1 tablet  (100 mg total) by mouth daily. 04/16/20 10/13/20 Yes Trinna Post, PA-C  metoprolol tartrate (LOPRESSOR) 25 MG tablet TAKE 1/2 TABLET TWICE A DAY. IF BP IS STILL > 120/80 AFTER TWO WKS, INCREASE TO 1 TABLET TWICE DAILY Patient taking differently: Take 25 mg by mouth daily.  06/02/20  Yes Trinna Post, PA-C  Multiple Vitamin (MULTIVITAMIN) tablet Take 1 tablet by mouth daily.   Yes [provider]  aspirin 81 MG tablet Take 81 mg by mouth daily.  Patient not taking: Reported on 08/05/2020    [provider]     Positive ROS: All other systems have been reviewed and were otherwise negative with the exception of those mentioned in the HPI and as above.  Physical Exam: General: Alert, no acute distress Cardiovascular: Regular rate and rhythm, no murmurs rubs or gallops.  No pedal edema Respiratory: Clear to auscultation bilaterally, no wheezes rales or rhonchi. No cyanosis, no use of accessory musculature GI: No organomegaly, abdomen is soft and non-tender nondistended with positive bowel sounds. Skin: Skin intact, no lesions within the operative field. Neurologic: Sensation intact distally Psychiatric: Patient is competent for consent with normal mood and affect Lymphatic: No cervical lymphadenopathy   MUSCULOSKELETAL: Right knee: Patient has intact skin.  There is no erythema ecchymosis and only a trace effusion.  Range of motion is from 0 to 120 degrees.  She has tenderness over the medial joint line and pain with McMurray's testing.  Patient has no ligamentous laxity.  She has 5-5 strength in the muscles, intact sensation light touch and palpable pedal pulses.  Assessment: Complex Tear of Medial Meniscus, right knee  Plan: Plan for Procedure(s): RIGHT KNEE ARTHROSCOPY WITH MEDIAL MENISECTOMY  I reviewed the details of the operation as well as the postoperative course with the patient.  I discussed the risks and benefits of surgery. The risks include but are  not limited to infection, bleeding, nerve or blood vessel injury, joint stiffness or loss of motion, persistent pain, weakness or instability, retear of the medial meniscus and the need for further surgery. Medical risks include but are not limited to DVT and pulmonary embolism, myocardial infarction, stroke, pneumonia, respiratory failure and death. Patient understood these risks and wished to proceed.    Thornton Park, MD   08/21/2020 7:57 AM

## 2020-08-21 NOTE — Op Note (Signed)
PATIENT:  Stephanie Jacobs  PRE-OPERATIVE DIAGNOSIS:  TEAR OF MEDIAL MENISCUS, right knee  POST-OPERATIVE DIAGNOSIS: Right knee medial meniscus tear with chondral lesions of the medial femoral condyle and trochlea  PROCEDURE:  RIGHT KNEE ARTHROSCOPIC Partial MEDIAL MENISECTOMY, chondroplasty of the medial femoral condyle and trochlea and synovectomy  SURGEON:  Juanell Fairly, MD  ANESTHESIA:   General  PREOPERATIVE INDICATIONS:  Stephanie Jacobs  54 y.o. female with a diagnosis of TEAR OF MEDIAL MENISCUS of the right knee who failed conservative management and elected for surgical management.    The risks benefits and alternatives were discussed with the patient preoperatively including the risks of infection, bleeding, nerve injury, knee stiffness, persistent pain, osteoarthritis and the need for further surgery. Medical  risks include DVT and pulmonary embolism, myocardial infarction, stroke, pneumonia, respiratory failure and death. The patient understood these risks and wished to proceed.  OPERATIVE FINDINGS: Complex tear of the distal horn of the medial meniscus, 1 x 1 cm grade 3 lesion in the medial femoral condyle, 5 mm x 10 mm grade 4 chondral lesion of the trochlea  OPERATIVE PROCEDURE: Patient was met in the preoperative area. The operative extremity was signed with the word yes and my initials according the hospital's correct site of surgery protocol.  A preop H&P was performed at the bedside.  Patient's right knee was marked according the hospital's correct site of surgery protocol.  The patient was brought to the operating room where they was placed supine on the operative table. General anesthesia was administered. The patient was prepped and draped in a sterile fashion.  A timeout was performed to verify the patient's name, date of birth, medical record number, correct site of surgery correct procedure to be performed. It was also used to verify the patient received antibiotics  that all appropriate instruments, and radiographic studies were available in the room. Once all in attendance were in agreement, the case began.  Proposed arthroscopy incisions were drawn out with a surgical marker. These were pre-injected with 1% lidocaine plain. An 11 blade was used to establish an inferior lateral and inferomedial portals. The inferomedial portal was created using a 18-gauge spinal needle under direct visualization.  A full diagnostic examination of the knee was performed including the suprapatellar pouch, patellofemoral joint, medial lateral compartments as well as the medial lateral gutters, the intercondylar notch in the posterior knee.  A partial synovectomy was performed using a 4-0 resector shaver blade and 90 ArthroCare wand. Patient had the medial meniscal tear treated with a 4-0 resector shaver blade and straight duckbill biter. The meniscus was debrided until a stable rim was achieved. A chondroplasty of the medial femoral condyle and trochlea was also performed using a 4-0 resector shaver blade.   The knee was then copiously lavaged. All arthroscopic instruments were removed. The two arthroscopy portals were closed with 4-0 nylon. Steri-Strips were applied along with a dry sterile and compressive dressing after 30 cc of 0.25% Marcaine plain was injected intra-articularly at the conclusion of the case for postoperative pain control.  A Polar Care was applied over the dressing.  The patient was brought to the PACU in stable condition. I was scrubbed and present for the entire case and all sharp and instrument counts were correct at the conclusion the case. I spoke with the patient's friend by phone from the PACU postoperatively to let her know the case was performed without complication and the patient was stable in the recovery room.   Caryn Bee  L. Mack Guise, MD

## 2020-08-21 NOTE — Transfer of Care (Signed)
Immediate Anesthesia Transfer of Care Note  Patient: Stephanie Jacobs  Procedure(s) Performed: RIGHT KNEE ARTHROSCOPY WITH MEDIAL MENISECTOMY (Right Knee)  Patient Location: PACU  Anesthesia Type:General  Level of Consciousness: drowsy and patient cooperative  Airway & Oxygen Therapy: Patient connected to face mask oxygen  Post-op Assessment: Report given to RN and Post -op Vital signs reviewed and stable  Post vital signs: Reviewed and stable  Last Vitals:  Vitals Value Taken Time  BP 119/81 08/21/20 0921  Temp    Pulse 70 08/21/20 0924  Resp 9 08/21/20 0924  SpO2 97 % 08/21/20 0924  Vitals shown include unvalidated device data.  Last Pain:  Vitals:   08/21/20 0621  TempSrc: Temporal  PainSc: 0-No pain         Complications: No complications documented.

## 2020-08-21 NOTE — OR Nursing (Signed)
PT in for crutch instruction @ 11:52 am.

## 2020-08-21 NOTE — Evaluation (Signed)
Physical Therapy Evaluation Patient Details Name: Stephanie Jacobs MRN: [id number] DOB: Feb 11, 1966 Today's Date: 08/21/2020   History of Present Illness  Stephanie Jacobs presents to hospital today for R knee arthroscopy for complex tear of medial meniscus. PMH includes HTN and sickle cell trait.  Clinical Impression  Pt received sitting in recliner chair and awaiting PT assessment. Pt able to perform sit <> stand transfers with CGA to both RW and axillary crutches for steadying. Pt initially ambulating with axillary crutches with min A for steadying and then using RW with CGA. Pt demonstrated improved gait quality, balance, and safety using RW and recommend that pt utilize RW while NWB. Pt also educated on WBAT 2-3 days after surgery. Pt negotiated 2 steps using RW with good balance and no LOB. CGA provided for safety and steadying. Pt maintained NWB on RLE throughout session. Pt with deficits in strength, balance, fucntional mobility however cleared by PT standpoint as demonstrated good balance and safety with transfers, ambulation, and stair negotiation. Follow surgeon's recommendation for follow up PT.   This entire session was guided, instructed, and directly supervised by Greggory Stallion, DPT.     Follow Up Recommendations Follow surgeon's recommendation for DC plan and follow-up therapies    Equipment Recommendations  Rolling walker with 5" wheels;Crutches    Recommendations for Other Services       Precautions / Restrictions Precautions Precautions: Fall Restrictions Weight Bearing Restrictions: Yes RLE Weight Bearing: Non weight bearing Other Position/Activity Restrictions: pt may WBAT 2-3 days after surgery      Mobility  Bed Mobility               General bed mobility comments: pt sitting in chair so not assessed  Transfers Overall transfer level: Needs assistance Equipment used: Rolling walker (2 wheeled);Crutches Transfers: Sit to/from Stand Sit to Stand: Min  guard         General transfer comment: CGA for steadying to stand to axillary crutches then RW  Ambulation/Gait Ambulation/Gait assistance: Min assist;Min guard Gait Distance (Feet): 75 Feet Assistive device: Rolling walker (2 wheeled);Crutches   Gait velocity: decreased   General Gait Details: Pt initially ambulating 30 feet using axillary crutches with min A for steadying, pt them ambulated 35 feet uisng RW with CGA to close SBA for safety with good balance noted  Stairs Stairs: Yes Stairs assistance: Min guard Stair Management: No rails;Backwards;With walker Number of Stairs: 2 General stair comments: pt with good control while ascending/descending step using RW with CGA for steadying; noted no LOB  Wheelchair Mobility    Modified Rankin (Stroke Patients Only)       Balance Overall balance assessment: Needs assistance Sitting-balance support: Feet supported Sitting balance-Leahy Scale: Normal     Standing balance support: Bilateral upper extremity supported Standing balance-Leahy Scale: Good Standing balance comment: CGA for static standing balance with axillary crutches or RW                             Pertinent Vitals/Pain Pain Assessment: No/denies pain    Home Living Family/patient expects to be discharged to:: Private residence Living Arrangements: Other relatives (3 y/o granddaughter) Available Help at Discharge: Family;Available PRN/intermittently Type of Home: Apartment Home Access: Other (comment) (has one curb to go up)     Home Layout: Two level;Able to live on main level with bedroom/bathroom Home Equipment: None      Prior Function Level of Independence: Independent  Comments: Pt reports fall history, which lead to meniscal tear.     Hand Dominance        Extremity/Trunk Assessment   Upper Extremity Assessment Upper Extremity Assessment: Overall WFL for tasks assessed (grossly 4+ to 5/5 bilaterally)     Lower Extremity Assessment Lower Extremity Assessment: Overall WFL for tasks assessed;RLE deficits/detail (LLE grossly 4+ to 5/5) RLE Deficits / Details: able to lift against gravity RLE Sensation: WNL       Communication   Communication: No difficulties  Cognition Arousal/Alertness: Awake/alert Behavior During Therapy: WFL for tasks assessed/performed Overall Cognitive Status: Within Functional Limits for tasks assessed                                        General Comments      Exercises Other Exercises Other Exercises: pt educated on car transfers for safe technique; all questions answered   Assessment/Plan    PT Assessment All further PT needs can be met in the next venue of care  PT Problem List Decreased strength;Decreased range of motion;Decreased activity tolerance;Decreased balance;Decreased mobility;Decreased knowledge of use of DME;Pain       PT Treatment Interventions      PT Goals (Current goals can be found in the Care Plan section)  Acute Rehab PT Goals Patient Stated Goal: to go home PT Goal Formulation: With patient Time For Goal Achievement: 08/21/20 Potential to Achieve Goals: Good    Frequency     Barriers to discharge        Co-evaluation               AM-PAC PT "6 Clicks" Mobility  Outcome Measure Help needed turning from your back to your side while in a flat bed without using bedrails?: None Help needed moving from lying on your back to sitting on the side of a flat bed without using bedrails?: None Help needed moving to and from a bed to a chair (including a wheelchair)?: A Little Help needed standing up from a chair using your arms (e.g., wheelchair or bedside chair)?: A Little Help needed to walk in hospital room?: A Little Help needed climbing 3-5 steps with a railing? : A Little 6 Click Score: 20    End of Session Equipment Utilized During Treatment: Gait belt Activity Tolerance: Patient tolerated  treatment well Patient left: in chair;with family/visitor present Nurse Communication: Mobility status PT Visit Diagnosis: Unsteadiness on feet (R26.81);Muscle weakness (generalized) (M62.81);History of falling (Z91.81)    Time: 2119-4174 PT Time Calculation (min) (ACUTE ONLY): 30 min   Charges:              Vale Haven, SPT  Vale Haven 08/21/2020, 1:48 PM

## 2020-08-21 NOTE — TOC Initial Note (Signed)
Transition of Care [facility]) - Initial/Assessment Note    Patient Details  Name: Stephanie Jacobs MRN: [id number] Date of Birth: 1966/08/30  [facility] CM/SW Contact:    Anselm Pancoast, RN Phone Number: 08/21/2020, 12:24 PM  Clinical Narrative:                 Received call from nurse requesting walker for patient. Patient ready for discharge.   TOC RN CM outreached to [employer] [facility] [employer] requesting walker be delivered. Mardene Celeste acknowledged request.         Patient Goals and CMS Choice        Expected Discharge Plan and Services           Expected Discharge Date: 08/21/20                                    Prior Living Arrangements/Services                       Activities of Daily Living      Permission Sought/Granted                  Emotional Assessment              Admission diagnosis:  Complex Tear of Medial Meniscus Patient Active Problem List   Diagnosis Date Noted  . S/P laparoscopic hysterectomy 08/20/2019  . Fibroids 08/07/2019  . Menometrorrhagia 07/03/2019  . Alcohol abuse 03/09/2019  . Anxiety 05/07/2015  . Abnormal LFTs 05/07/2015  . Essential (primary) hypertension 05/07/2015  . Blood glucose elevated 05/07/2015  . Counseling on substance use and abuse 05/07/2015   PCP:  Trinna Post, PA-C Pharmacy:   [id number] - [place], [place] [address] Phone: [phone] Fax: [phone]     Social Determinants of Health (SDOH) Interventions    Readmission Risk Interventions No flowsheet data found.

## 2020-08-21 NOTE — Discharge Instructions (Signed)

## 2020-08-21 NOTE — Anesthesia Preprocedure Evaluation (Addendum)
Anesthesia Evaluation  Patient identified by MRN, date of birth, ID band Patient awake    Reviewed: Allergy & Precautions, H&P , NPO status , Patient's Chart, lab work & pertinent test results  History of Anesthesia Complications Negative for: history of anesthetic complications  Airway Mallampati: II  TM Distance: >3 FB     Dental  (+) Teeth Intact   Pulmonary neg sleep apnea, neg COPD, Current SmokerPatient did not abstain from smoking.,    breath sounds clear to auscultation       Cardiovascular hypertension, (-) angina(-) Past MI and (-) Cardiac Stents (-) dysrhythmias  Rhythm:regular Rate:Normal     Neuro/Psych Anxiety negative neurological ROS     GI/Hepatic negative GI ROS, (+)     substance abuse (ETOH abuse in chart; pt reports 2 glasses wine daily)  alcohol use,   Endo/Other  negative endocrine ROS  Renal/GU      Musculoskeletal   Abdominal   Peds  Hematology negative hematology ROS (+)   Anesthesia Other Findings Past Medical History: No date: Hypertension No date: Sickle cell trait ([place])  Past Surgical History: No date: ABDOMINAL HYSTERECTOMY 05/02/2018: BREAST BIOPSY; Right     Comment:  Affirm Bx- Fibrocystic breast tissue - coil clip 02/22/2020: COLONOSCOPY WITH PROPOFOL; N/A     Comment:  Procedure: COLONOSCOPY WITH PROPOFOL;  Surgeon: [name], MD;  Location: [facility] ENDOSCOPY;  Service:               Gastroenterology;  Laterality: N/A; 08/07/2019: CYSTOSCOPY     Comment:  Procedure: CYSTOSCOPY;  Surgeon: Gae Dry, MD;                Location: [facility] ORS;  Service: Gynecology;; No date: DIAGNOSTIC LAPAROSCOPY 08/07/2019: TOTAL LAPAROSCOPIC HYSTERECTOMY WITH BILATERAL SALPINGO  OOPHORECTOMY; N/A     Comment:  Procedure: TOTAL LAPAROSCOPIC HYSTERECTOMY WITH               BILATERAL SALPINGO;  Surgeon: Gae Dry, MD;                Location: [facility] ORS;  Service:  Gynecology;  Laterality:               N/A; 1995: TUBAL LIGATION  BMI    Body Mass Index: 34.72 kg/m      Reproductive/Obstetrics negative OB ROS                          Anesthesia Physical Anesthesia Plan  ASA: II  Anesthesia Plan: General LMA   Post-op Pain Management:    Induction:   PONV Risk Score and Plan: Dexamethasone, Ondansetron, Midazolam and Treatment may vary due to age or medical condition  Airway Management Planned:   Additional Equipment:   Intra-op Plan:   Post-operative Plan:   Informed Consent: I have reviewed the patients History and Physical, chart, labs and discussed the procedure including the risks, benefits and alternatives for the proposed anesthesia with the patient or authorized representative who has indicated his/her understanding and acceptance.     Dental Advisory Given  Plan Discussed with: Anesthesiologist, CRNA and Surgeon  Anesthesia Plan Comments:         Anesthesia Quick Evaluation

## 2020-08-21 NOTE — Anesthesia Procedure Notes (Signed)
Procedure Name: Intubation Performed by: Staci Acosta, CRNA Pre-anesthesia Checklist: Patient identified, Patient being monitored, Timeout performed, Emergency Drugs available and Suction available Patient Re-evaluated:Patient Re-evaluated prior to induction Oxygen Delivery Method: Circle system utilized Preoxygenation: Pre-oxygenation with 100% oxygen Induction Type: IV induction and Rapid sequence Ventilation: Mask ventilation without difficulty Laryngoscope Size: 3 and McGraph Grade View: Grade I Tube type: Oral Tube size: 7.0 mm Number of attempts: 1 Airway Equipment and Method: Stylet Placement Confirmation: ETT inserted through vocal cords under direct vision,  positive ETCO2 and breath sounds checked- equal and bilateral Secured at: 21 cm Tube secured with: Tape Dental Injury: Teeth and Oropharynx as per pre-operative assessment

## 2020-08-21 NOTE — Anesthesia Postprocedure Evaluation (Signed)
Anesthesia Post Note  Patient: Stephanie Jacobs  Procedure(s) Performed: RIGHT KNEE ARTHROSCOPY WITH MEDIAL MENISECTOMY (Right Knee)  Patient location during evaluation: PACU Anesthesia Type: General Level of consciousness: awake and alert Pain management: pain level controlled Vital Signs Assessment: post-procedure vital signs reviewed and stable Respiratory status: spontaneous breathing, nonlabored ventilation and respiratory function stable Cardiovascular status: blood pressure returned to baseline and stable Postop Assessment: no apparent nausea or vomiting Anesthetic complications: no   No complications documented.   Last Vitals:  Vitals:   08/21/20 1222 08/21/20 1311  BP: (!) 155/96 (!) 168/98  Pulse: 65 63  Resp: 16 16  Temp: (!) 36.2 C   SpO2: 100% 100%    Last Pain:  Vitals:   08/21/20 1311  TempSrc:   PainSc: [place]

## 2020-08-29 DIAGNOSIS — M25661 Stiffness of right knee, not elsewhere classified: Secondary | ICD-10-CM | POA: Diagnosis not present

## 2020-08-29 DIAGNOSIS — M25561 Pain in right knee: Secondary | ICD-10-CM | POA: Diagnosis not present

## 2020-10-16 ENCOUNTER — Ambulatory Visit: Payer: Self-pay | Admitting: Physician Assistant

## 2021-03-09 ENCOUNTER — Telehealth: Payer: Self-pay

## 2021-03-09 NOTE — Telephone Encounter (Signed)
Copied from [place] [id number]. Topic: Appointment Scheduling - Scheduling Inquiry for Clinic >> Mar 09, 2021  4:50 PM Tessa Lerner A wrote: Reason for CRM: Patient would like to be seen in office or virtually regarding a note for work  Patient is experiencing body aches, a cough and sore throat  Patient has been experiencing symptoms since 03/08/21  Patient is required to be seen by their PCP before they can return to work  Please contact to advise scheduling further

## 2021-03-10 ENCOUNTER — Encounter: Payer: Self-pay | Admitting: Physician Assistant

## 2021-03-10 ENCOUNTER — Telehealth (INDEPENDENT_AMBULATORY_CARE_PROVIDER_SITE_OTHER): Payer: Self-pay | Admitting: Physician Assistant

## 2021-03-10 VITALS — Wt 192.0 lb

## 2021-03-10 DIAGNOSIS — J069 Acute upper respiratory infection, unspecified: Secondary | ICD-10-CM

## 2021-03-10 NOTE — Progress Notes (Signed)
MyChart Video Visit    Virtual Visit via Video Note   This visit type was conducted due to national recommendations for restrictions regarding the COVID-19 Pandemic (e.g. social distancing) in an effort to limit this patient's exposure and mitigate transmission in our community. This patient is at least at moderate risk for complications without adequate follow up. This format is felt to be most appropriate for this patient at this time. Physical exam was limited by quality of the video and audio technology used for the visit.   Patient location: Home Provider location: [facility]  I discussed the limitations of evaluation and management by telemedicine and the availability of in person appointments. The patient expressed understanding and agreed to proceed.  Patient: Stephanie Jacobs   DOB: 12-Mar-1966   55 y.o. Female  MRN: [id number] Visit Date: 03/10/2021  Today's healthcare provider: Mar Daring, PA-C   No chief complaint on file.  Subjective    URI  This is a new problem. The current episode started in the past 7 days (late Saturday night). The problem has been gradually improving. There has been no fever. Associated symptoms include congestion, coughing, a sore throat and vomiting. Pertinent negatives include no chest pain, diarrhea, ear pain, headaches, nausea, rhinorrhea, sinus pain or wheezing. She has tried acetaminophen and decongestant (mucinex) for the symptoms. The treatment provided moderate relief.   Covid 19 negative  Patient reports she is feeling much better today and appetite improving. Needs work note.  Patient Active Problem List   Diagnosis Date Noted  . S/P laparoscopic hysterectomy 08/20/2019  . Fibroids 08/07/2019  . Menometrorrhagia 07/03/2019  . Alcohol abuse 03/09/2019  . Anxiety 05/07/2015  . Abnormal LFTs 05/07/2015  . Essential (primary) hypertension 05/07/2015  . Blood glucose elevated 05/07/2015  . Counseling on substance use and abuse  05/07/2015   Social History   Tobacco Use  . Smoking status: Current Every Day Smoker    Packs/day: 0.50    Types: Cigarettes  . Smokeless tobacco: Never Used  Vaping Use  . Vaping Use: Never used  Substance Use Topics  . Alcohol use: Yes    Alcohol/week: 7.0 standard drinks    Types: 7 Glasses of wine per week    Comment: One glass of red wine in the evenings.  . Drug use: No   Allergies  Allergen Reactions  . Tape Rash      Medications: Outpatient Medications Prior to Visit  Medication Sig  . amLODipine (NORVASC) 10 MG tablet TAKE 1 TABLET BY MOUTH EVERY DAY  . aspirin EC 325 MG tablet Take 1 tablet (325 mg total) by mouth daily.  . hydrochlorothiazide (HYDRODIURIL) 25 MG tablet TAKE 1 TABLET BY MOUTH EVERY DAY (Patient taking differently: Take 25 mg by mouth daily. )  . HYDROcodone-acetaminophen (NORCO) 5-325 MG tablet Take 1 tablet by mouth every 4 (four) hours as needed for moderate pain.  [name] losartan (COZAAR) 100 MG tablet Take 1 tablet (100 mg total) by mouth daily.  . metoprolol tartrate (LOPRESSOR) 25 MG tablet TAKE 1/2 TABLET TWICE A DAY. IF BP IS STILL > 120/80 AFTER TWO WKS, INCREASE TO 1 TABLET TWICE DAILY (Patient taking differently: Take 25 mg by mouth daily. )  . Multiple Vitamin (MULTIVITAMIN) tablet Take 1 tablet by mouth daily.  . ondansetron (ZOFRAN) 4 MG tablet Take 1 tablet (4 mg total) by mouth every 8 (eight) hours as needed for nausea or vomiting.   No facility-administered medications prior to visit.  Review of Systems  Constitutional: Negative for activity change, appetite change, chills, fatigue and fever.  HENT: Positive for congestion and sore throat. Negative for ear pain, postnasal drip, rhinorrhea, sinus pressure and sinus pain.   Respiratory: Positive for cough. Negative for shortness of breath and wheezing.   Cardiovascular: Negative for chest pain and palpitations.  Gastrointestinal: Positive for vomiting. Negative for constipation,  diarrhea and nausea.  Musculoskeletal: Positive for myalgias.  Neurological: Negative for headaches.    Last CBC Lab Results  Component Value Date   WBC 7.6 08/19/2020   HGB 15.9 (H) 08/19/2020   HCT 44.6 08/19/2020   MCV 88.8 08/19/2020   MCH 31.7 08/19/2020   RDW 13.0 08/19/2020   PLT 317 08/19/2020      Objective    LMP 07/21/2019 (Exact Date)  BP Readings from Last 3 Encounters:  08/21/20 (!) 168/98  04/16/20 126/87  02/22/20 134/79   Wt Readings from Last 3 Encounters:  08/21/20 196 lb (88.9 kg)  08/13/20 196 lb (88.9 kg)  04/16/20 190 lb (86.2 kg)      Physical Exam Vitals reviewed.  Constitutional:      General: She is not in acute distress.    Appearance: Normal appearance. She is well-developed. She is not ill-appearing.  HENT:     Head: Normocephalic and atraumatic.  Pulmonary:     Effort: Pulmonary effort is normal. No respiratory distress.  Musculoskeletal:     Cervical back: Normal range of motion and neck supple.  Neurological:     Mental Status: She is alert.  Psychiatric:        Mood and Affect: Mood normal.        Behavior: Behavior normal.        Thought Content: Thought content normal.        Judgment: Judgment normal.       Assessment & Plan     1. Viral URI 1. Viral URI Much improved. Appetite improving today. Discussed continuing Mucinex PRN. Also adding Vit D, Vit C and zinc to boost immune system back up again. Work note provided to return to work.     No follow-ups on file.     I discussed the assessment and treatment plan with the patient. The patient was provided an opportunity to ask questions and all were answered. The patient agreed with the plan and demonstrated an understanding of the instructions.   The patient was advised to call back or seek an in-person evaluation if the symptoms worsen or if the condition fails to improve as anticipated.  I provided 6 minutes of non-face-to-face time during this encounter.  Reynolds Bowl, PA-C, have reviewed all documentation for this visit. The documentation on 03/10/21 for the exam, diagnosis, procedures, and orders are all accurate and complete.  [place] [facility] [phone] (phone) [phone] (fax)  [employer]

## 2021-05-28 ENCOUNTER — Encounter: Payer: Self-pay | Admitting: Obstetrics and Gynecology

## 2021-05-28 ENCOUNTER — Other Ambulatory Visit: Payer: Self-pay

## 2021-05-28 ENCOUNTER — Ambulatory Visit (INDEPENDENT_AMBULATORY_CARE_PROVIDER_SITE_OTHER): Payer: Managed Care, Other (non HMO) | Admitting: Obstetrics and Gynecology

## 2021-05-28 ENCOUNTER — Other Ambulatory Visit (HOSPITAL_COMMUNITY)
Admission: RE | Admit: 2021-05-28 | Discharge: 2021-05-28 | Disposition: A | Discharge status: Home / Self Care | Payer: Managed Care, Other (non HMO) | Source: Ambulatory Visit | Attending: Obstetrics and Gynecology | Admitting: Obstetrics and Gynecology

## 2021-05-28 VITALS — BP 150/100 | Ht 63.0 in | Wt 193.0 lb

## 2021-05-28 DIAGNOSIS — Z202 Contact with and (suspected) exposure to infections with a predominantly sexual mode of transmission: Secondary | ICD-10-CM

## 2021-05-28 DIAGNOSIS — B9689 Other specified bacterial agents as the cause of diseases classified elsewhere: Secondary | ICD-10-CM

## 2021-05-28 DIAGNOSIS — Z113 Encounter for screening for infections with a predominantly sexual mode of transmission: Secondary | ICD-10-CM

## 2021-05-28 DIAGNOSIS — A599 Trichomoniasis, unspecified: Secondary | ICD-10-CM | POA: Diagnosis not present

## 2021-05-28 DIAGNOSIS — N760 Acute vaginitis: Secondary | ICD-10-CM | POA: Diagnosis not present

## 2021-05-28 LAB — POCT WET PREP WITH KOH
Clue Cells Wet Prep HPF POC: POSITIVE
KOH Prep POC: POSITIVE — AB
Trichomonas, UA: POSITIVE
Yeast Wet Prep HPF POC: NEGATIVE

## 2021-05-28 MED ORDER — METRONIDAZOLE 500 MG PO TABS: ORAL_TABLET | ORAL | 0 refills | Status: DC

## 2021-05-28 MED ORDER — FLUCONAZOLE 150 MG PO TABS: 150.0000 mg | ORAL_TABLET | Freq: Once | ORAL | 0 refills | Status: AC

## 2021-05-28 NOTE — Addendum Note (Signed)
Addended by: Ardeth Perfect B on: 1/0/3013 03:33 PM   Modules accepted: Orders

## 2021-05-28 NOTE — Progress Notes (Addendum)
Trinna Post, PA-C   Chief Complaint  Patient presents with   Vaginal Discharge    Itching/irritation, no odor since Monday     HPI:      Ms. Stephanie Jacobs is a 55 y.o. G6P4 whose LMP was Patient's last menstrual period was 07/21/2019 (exact date)., presents today for increased vag d/c with itching/irritation for 4 days, no fishy odor. No new soap, detergents, no meds to treat, no prior abx use. No hx of BV. No LBP, pelvic pain, fevers, urin sx.  Pt is sex active but found out partner unfaithful. Would like full STD testing. Hx of chlamydia in distant past.  S/p hyst.   Past Medical History:  Diagnosis Date   Hypertension    Sickle cell trait [facility])     Past Surgical History:  Procedure Laterality Date   ABDOMINAL HYSTERECTOMY     BREAST BIOPSY Right 05/02/2018   Affirm Bx- Fibrocystic breast tissue - coil clip   COLONOSCOPY WITH PROPOFOL N/A 02/22/2020   Procedure: COLONOSCOPY WITH PROPOFOL;  Surgeon: [name], MD;  Location: [facility] ENDOSCOPY;  Service: Gastroenterology;  Laterality: N/A;   CYSTOSCOPY  08/07/2019   Procedure: CYSTOSCOPY;  Surgeon: Gae Dry, MD;  Location: [facility] ORS;  Service: Gynecology;;   DIAGNOSTIC LAPAROSCOPY     KNEE ARTHROSCOPY WITH MEDIAL MENISECTOMY Right 08/21/2020   Procedure: RIGHT KNEE ARTHROSCOPY WITH MEDIAL MENISECTOMY;  Surgeon: Thornton Park, MD;  Location: [facility] ORS;  Service: Orthopedics;  Laterality: Right;   TOTAL LAPAROSCOPIC HYSTERECTOMY WITH BILATERAL SALPINGO OOPHORECTOMY N/A 08/07/2019   Procedure: TOTAL LAPAROSCOPIC HYSTERECTOMY WITH BILATERAL SALPINGO;  Surgeon: Gae Dry, MD;  Location: [facility] ORS;  Service: Gynecology;  Laterality: N/A;   TUBAL LIGATION  1995    Family History  Problem Relation Age of Onset   Hypertension Mother    Diabetes Mother    Sickle cell anemia Brother    Sickle cell trait Brother    Breast cancer Neg Hx     Social History   Socioeconomic History   Marital status: Married     Spouse name: Not on file   Number of children: Not on file   Years of education: Not on file   Highest education level: Not on file  Occupational History   Not on file  Tobacco Use   Smoking status: Every Day    Packs/day: 0.50    Pack years: 0.00    Types: Cigarettes   Smokeless tobacco: Never  Vaping Use   Vaping Use: Never used  Substance and Sexual Activity   Alcohol use: Yes    Alcohol/week: 7.0 standard drinks    Types: 7 Glasses of wine per week    Comment: One glass of red wine in the evenings.   Drug use: No   Sexual activity: Yes    Birth control/protection: Surgical    Comment: Hysterectomy  Other Topics Concern   Not on file  Social History Narrative   Not on file   Social Determinants of Health   Financial Resource Strain: Not on file  Food Insecurity: Not on file  Transportation Needs: Not on file  Physical Activity: Not on file  Stress: Not on file  Social Connections: Not on file  Intimate Partner Violence: Not on file    Outpatient Medications Prior to Visit  Medication Sig Dispense Refill   amLODipine (NORVASC) 10 MG tablet TAKE 1 TABLET BY MOUTH EVERY DAY 30 tablet 5   aspirin EC 325 MG tablet  Take 1 tablet (325 mg total) by mouth daily. 45 tablet 0   hydrochlorothiazide (HYDRODIURIL) 25 MG tablet TAKE 1 TABLET BY MOUTH EVERY DAY (Patient taking differently: Take 25 mg by mouth daily.) 30 tablet 2   metoprolol tartrate (LOPRESSOR) 25 MG tablet TAKE 1/2 TABLET TWICE A DAY. IF BP IS STILL > 120/80 AFTER TWO WKS, INCREASE TO 1 TABLET TWICE DAILY (Patient taking differently: Take 25 mg by mouth daily.) 60 tablet 2   Multiple Vitamin (MULTIVITAMIN) tablet Take 1 tablet by mouth daily.     losartan (COZAAR) 100 MG tablet Take 1 tablet (100 mg total) by mouth daily. 90 tablet 1   HYDROcodone-acetaminophen (NORCO) 5-325 MG tablet Take 1 tablet by mouth every 4 (four) hours as needed for moderate pain. (Patient not taking: Reported on 03/10/2021) 40 tablet 0    ondansetron (ZOFRAN) 4 MG tablet Take 1 tablet (4 mg total) by mouth every 8 (eight) hours as needed for nausea or vomiting. (Patient not taking: Reported on 03/10/2021) 30 tablet 0   No facility-administered medications prior to visit.      ROS:  Review of Systems  Constitutional:  Negative for fever.  Gastrointestinal:  Negative for blood in stool, constipation, diarrhea, nausea and vomiting.  Genitourinary:  Positive for vaginal discharge. Negative for dyspareunia, dysuria, flank pain, frequency, hematuria, urgency, vaginal bleeding and vaginal pain.  Musculoskeletal:  Negative for back pain.  Skin:  Negative for rash.  BREAST: No symptoms   OBJECTIVE:   Vitals:  BP (!) 150/100   Ht 5\' 3"  (1.6 m)   Wt 193 lb (87.5 kg)   LMP 07/21/2019 (Exact Date)   BMI 34.19 kg/m   Physical Exam Vitals reviewed.  Constitutional:      Appearance: She is well-developed.  Pulmonary:     Effort: Pulmonary effort is normal.  Genitourinary:    General: Normal vulva.     Pubic Area: No rash.      Labia:        Right: No rash, tenderness or lesion.        Left: No rash, tenderness or lesion.      Vagina: Vaginal discharge present. No erythema or tenderness.     Cervix: Normal.     Uterus: Normal. Not enlarged and not tender.      Adnexa: Right adnexa normal and left adnexa normal.       Right: No mass or tenderness.         Left: No mass or tenderness.    Musculoskeletal:        General: Normal range of motion.     Cervical back: Normal range of motion.  Skin:    General: Skin is warm and dry.  Neurological:     General: No focal deficit present.     Mental Status: She is alert and oriented to person, place, and time.  Psychiatric:        Mood and Affect: Mood normal.        Behavior: Behavior normal.        Thought Content: Thought content normal.        Judgment: Judgment normal.    Results: Results for orders placed or performed in visit on 05/28/21 (from the past 24  hour(s))  POCT Wet Prep with KOH     Status: Abnormal   Collection Time: 05/28/21  3:29 PM  Result Value Ref Range   Trichomonas, UA Positive    Clue Cells Wet Prep HPF POC pos  Epithelial Wet Prep HPF POC     Yeast Wet Prep HPF POC neg    Bacteria Wet Prep HPF POC     RBC Wet Prep HPF POC     WBC Wet Prep HPF POC     KOH Prep POC Positive (A) Negative     Assessment/Plan: BV (bacterial vaginosis) - Plan: POCT Wet Prep with KOH, metroNIDAZOLE (FLAGYL) 500 MG tablet; pos sx and wet prep. Rx flagyl, no EtOH. Rx diflucan prn yeast vag after abx use per pt request.  Trichomoniasis - Plan: POCT Wet Prep with KOH, metroNIDAZOLE (FLAGYL) 500 MG tablet; pos wet prep. Rx flagyl. Partner needs tx. No sex activity until 1 wk after both competed tx. F/u prn.   Exposure to STD - Plan: Cervicovaginal ancillary only, HIV Antibody (routine testing w rflx), RPR, HSV 2 antibody, IgG, Hepatitis C antibody  Screening for STD (sexually transmitted disease) - Plan: Cervicovaginal ancillary only, HIV Antibody (routine testing w rflx), RPR, HSV 2 antibody, IgG, Hepatitis C antibody; STD testing today. Will f/u with results. Discussed rechecking HIV in 6 months for accurate results. Can do with [place] or PCP.    Meds ordered this encounter  Medications   metroNIDAZOLE (FLAGYL) 500 MG tablet    Sig: Take 2 tabs BID for 1 day    Dispense:  4 tablet    Refill:  0    Order Specific Question:   Supervising Provider    Answer:   Gae Dry [[id number]]   fluconazole (DIFLUCAN) 150 MG tablet    Sig: Take 1 tablet (150 mg total) by mouth once for 1 dose.    Dispense:  1 tablet    Refill:  0    Order Specific Question:   Supervising Provider    Answer:   Gae Dry [[id number]]       Return if symptoms worsen or fail to improve.  Orissa Arreaga B. Keontae Levingston, PA-C 05/28/2021 3:32 PM

## 2021-05-29 ENCOUNTER — Telehealth: Payer: Self-pay | Admitting: Obstetrics and Gynecology

## 2021-05-29 DIAGNOSIS — R768 Other specified abnormal immunological findings in serum: Secondary | ICD-10-CM

## 2021-05-29 LAB — RPR: RPR Ser Ql: NONREACTIVE

## 2021-05-29 LAB — HSV 2 ANTIBODY, IGG: HSV 2 IgG, Type Spec: >23.6 index — ABNORMAL HIGH (ref 0.00–0.90)

## 2021-05-29 LAB — HIV ANTIBODY (ROUTINE TESTING W REFLEX): HIV Screen 4th Generation wRfx: NONREACTIVE

## 2021-05-29 LAB — HEPATITIS C ANTIBODY: Hep C Virus Ab: <0.1 s/co ratio (ref 0.0–0.9)

## 2021-05-29 MED ORDER — VALACYCLOVIR HCL 500 MG PO TABS: 500.0000 mg | ORAL_TABLET | Freq: Every day | ORAL | 3 refills | Status: DC

## 2021-05-29 NOTE — Telephone Encounter (Signed)
Pt aware of pos HSV 2 IgG. Has never had sx but wants daily valtrex as preventive. Rx eRxd. F/u prn.

## 2021-05-31 LAB — CERVICOVAGINAL ANCILLARY ONLY
Chlamydia: NEGATIVE
Comment: NEGATIVE
Comment: NORMAL
Neisseria Gonorrhea: NEGATIVE

## 2021-06-06 ENCOUNTER — Encounter: Payer: Self-pay | Admitting: Obstetrics and Gynecology

## 2021-07-02 ENCOUNTER — Other Ambulatory Visit: Payer: Self-pay

## 2021-07-02 ENCOUNTER — Encounter: Payer: Self-pay | Admitting: Family Medicine

## 2021-07-02 ENCOUNTER — Ambulatory Visit (INDEPENDENT_AMBULATORY_CARE_PROVIDER_SITE_OTHER): Payer: Managed Care, Other (non HMO) | Admitting: Family Medicine

## 2021-07-02 VITALS — BP 149/93 | HR 70 | Temp 98.7°F | Resp 16 | Ht 63.0 in | Wt 196.0 lb

## 2021-07-02 DIAGNOSIS — R945 Abnormal results of liver function studies: Secondary | ICD-10-CM

## 2021-07-02 DIAGNOSIS — R7989 Other specified abnormal findings of blood chemistry: Secondary | ICD-10-CM

## 2021-07-02 DIAGNOSIS — E669 Obesity, unspecified: Secondary | ICD-10-CM | POA: Insufficient documentation

## 2021-07-02 DIAGNOSIS — Z0000 Encounter for general adult medical examination without abnormal findings: Secondary | ICD-10-CM | POA: Diagnosis not present

## 2021-07-02 DIAGNOSIS — Z6834 Body mass index (BMI) 34.0-34.9, adult: Secondary | ICD-10-CM

## 2021-07-02 DIAGNOSIS — Z1211 Encounter for screening for malignant neoplasm of colon: Secondary | ICD-10-CM

## 2021-07-02 DIAGNOSIS — I10 Essential (primary) hypertension: Secondary | ICD-10-CM | POA: Diagnosis not present

## 2021-07-02 DIAGNOSIS — D573 Sickle-cell trait: Secondary | ICD-10-CM | POA: Insufficient documentation

## 2021-07-02 DIAGNOSIS — R739 Hyperglycemia, unspecified: Secondary | ICD-10-CM

## 2021-07-02 DIAGNOSIS — Z1231 Encounter for screening mammogram for malignant neoplasm of breast: Secondary | ICD-10-CM

## 2021-07-02 DIAGNOSIS — Z23 Encounter for immunization: Secondary | ICD-10-CM | POA: Diagnosis not present

## 2021-07-02 DIAGNOSIS — E66811 Obesity, class 1: Secondary | ICD-10-CM

## 2021-07-02 DIAGNOSIS — A6004 Herpesviral vulvovaginitis: Secondary | ICD-10-CM

## 2021-07-02 MED ORDER — LOSARTAN POTASSIUM 50 MG PO TABS: 50.0000 mg | ORAL_TABLET | Freq: Every day | ORAL | 1 refills | Status: DC

## 2021-07-02 NOTE — Assessment & Plan Note (Signed)
-   Continue Valtrex 500 mg daily

## 2021-07-02 NOTE — Assessment & Plan Note (Signed)
-   Continue amlodipine & metoprolol - Restart losartan 50 mg daily - CMP

## 2021-07-02 NOTE — Progress Notes (Signed)
Established patient visit   Patient: Stephanie Jacobs   DOB: Nov 20, 1966   55 y.o. Female  MRN: [id number] Visit Date: 07/02/2021  Today's healthcare provider: Lavon Paganini, MD   Chief Complaint  Patient presents with   Annual Exam   Subjective    HPI   Genital Herpes  - Diagnosed by OBGYN on 05/28/21 - Currently managed on Valtrex with no further outbreaks; pt. denies side effects  Hypertension - Chronically elevated BP over multiple recent visits; pt. denies chest pain - Currently managed on metoprolol & amlodipine; was previously taking HCTZ & Losartan as well, but HCTZ & Losartan were discontinued (pt. is unsure why) - Pt. states that she previously experienced myalgias with HCTZ, but is amenable to restarting Losartan today  Health Maintenance - UTD on COVID vaccination - Due for shingles vaccine today - Due for annual screening mammo - Abnormal colonoscopy on 02/22/20, due for follow-up w/ GI  EtOH use - Patient reports drinking 2-3 glasses of wine most days  Tobacco use - Pt. reports that she's not ready to quit at this time    Medications: Outpatient Medications Prior to Visit  Medication Sig   amLODipine (NORVASC) 10 MG tablet TAKE 1 TABLET BY MOUTH EVERY DAY   aspirin EC 325 MG tablet Take 1 tablet (325 mg total) by mouth daily.   metoprolol tartrate (LOPRESSOR) 25 MG tablet TAKE 1/2 TABLET TWICE A DAY. IF BP IS STILL > 120/80 AFTER TWO WKS, INCREASE TO 1 TABLET TWICE DAILY   Multiple Vitamin (MULTIVITAMIN) tablet Take 1 tablet by mouth daily.   valACYclovir (VALTREX) 500 MG tablet Take 1 tablet (500 mg total) by mouth daily. (Patient not taking: Reported on 07/02/2021)   [DISCONTINUED] hydrochlorothiazide (HYDRODIURIL) 25 MG tablet TAKE 1 TABLET BY MOUTH EVERY DAY (Patient not taking: Reported on 07/02/2021)   [DISCONTINUED] losartan (COZAAR) 100 MG tablet Take 1 tablet (100 mg total) by mouth daily.   [DISCONTINUED] metroNIDAZOLE (FLAGYL) 500 MG tablet  Take 2 tabs BID for 1 day   No facility-administered medications prior to visit.    Review of Systems  Constitutional: Negative.  Negative for appetite change.  HENT: Negative.    Respiratory:  Negative for chest tightness and shortness of breath.   Cardiovascular:  Negative for chest pain and leg swelling.  Gastrointestinal: Negative.   Endocrine: Negative.   Genitourinary: Negative.   Musculoskeletal: Negative.   Skin: Negative.   All other systems reviewed and are negative.     Objective    BP (!) 149/93   Pulse 70   Temp 98.7 F (37.1 C) (Oral)   Resp 16   Ht 5\' 3"  (1.6 m)   Wt 196 lb (88.9 kg)   LMP 07/21/2019 (Exact Date)   SpO2 100%   BMI 34.72 kg/m     Physical Exam Constitutional:      Appearance: Normal appearance. She is obese.  HENT:     Head: Normocephalic and atraumatic.     Right Ear: External ear normal.     Left Ear: External ear normal.  Cardiovascular:     Rate and Rhythm: Normal rate and regular rhythm.     Pulses: Normal pulses.     Heart sounds: Normal heart sounds.  Pulmonary:     Effort: Pulmonary effort is normal.     Breath sounds: Normal breath sounds.  Abdominal:     General: Abdomen is flat. Bowel sounds are normal.     Palpations: Abdomen  is soft.  Skin:    General: Skin is warm and dry.  Neurological:     Mental Status: She is alert.     No results found for any visits on 07/02/21.  Assessment & Plan     Problem List Items Addressed This Visit       Cardiovascular and Mediastinum   Essential (primary) hypertension    - Continue amlodipine & metoprolol - Restart losartan 50 mg daily - CMP       Relevant Medications   losartan (COZAAR) 50 MG tablet   Other Relevant Orders   Comprehensive metabolic panel     Genitourinary   Herpes simplex vulvovaginitis    - Continue Valtrex 500 mg daily         Other   Abnormal LFTs   Relevant Orders   Comprehensive metabolic panel   Blood glucose elevated   Relevant  Orders   Hemoglobin A1c   Sickle cell trait (HCC)   Relevant Orders   CBC w/Diff/Platelet   Class 1 obesity with serious comorbidity and body mass index (BMI) of 34.0 to 34.9 in adult   Relevant Orders   Lipid panel   Comprehensive metabolic panel   Other Visit Diagnoses     Encounter for annual physical exam    -  Primary   Relevant Orders   Hemoglobin A1c   Lipid panel   Comprehensive metabolic panel   CBC w/Diff/Platelet   Varicella-zoster vaccine IM (Completed)   Colon cancer screening       Relevant Orders   Ambulatory referral to Gastroenterology   Encounter for screening mammogram for malignant neoplasm of breast       Relevant Orders   MM 3D SCREEN BREAST BILATERAL   Need for zoster vaccination       Relevant Orders   Varicella-zoster vaccine IM (Completed)        Return in about 3 months (around 10/02/2021) for chronic disease f/u.      Percell Locus, MS3  Patient seen along with MS3 student Percell Locus. I personally evaluated this patient along with the student, and verified all aspects of the history, physical exam, and medical decision making as documented by the student. I agree with the student's documentation and have made all necessary edits.  Keontay Vora, Dionne Bucy, MD, MPH [facility]

## 2021-07-03 ENCOUNTER — Other Ambulatory Visit: Payer: Self-pay

## 2021-07-03 ENCOUNTER — Telehealth: Payer: Self-pay

## 2021-07-03 DIAGNOSIS — I10 Essential (primary) hypertension: Secondary | ICD-10-CM

## 2021-07-03 MED ORDER — AMLODIPINE BESYLATE 10 MG PO TABS: 10.0000 mg | ORAL_TABLET | Freq: Every day | ORAL | 5 refills | Status: DC

## 2021-07-03 MED ORDER — NA SULFATE-K SULFATE-MG SULF 17.5-3.13-1.6 GM/177ML PO SOLN: 1.0000 | Freq: Once | ORAL | 0 refills | Status: AC

## 2021-07-03 MED ORDER — METOPROLOL TARTRATE 25 MG PO TABS: ORAL_TABLET | ORAL | 5 refills | Status: DC

## 2021-07-03 NOTE — Telephone Encounter (Signed)
Rx was sent to pharmacy. 

## 2021-07-03 NOTE — Telephone Encounter (Signed)
[facility] faxed refill request for the following medications:  metoprolol tartrate (LOPRESSOR) 25 MG tablet  amLODipine (NORVASC) 10 MG tablet  Please advise.

## 2021-07-03 NOTE — Progress Notes (Signed)
Procedure and Bowel prep instructions sent via my chart and mailed out. Prep sent to pharmacy of choice.

## 2021-07-15 ENCOUNTER — Other Ambulatory Visit: Payer: Self-pay

## 2021-07-15 MED ORDER — NA SULFATE-K SULFATE-MG SULF 17.5-3.13-1.6 GM/177ML PO SOLN: 354.0000 mL | Freq: Once | ORAL | 0 refills | Status: AC

## 2021-07-15 NOTE — Progress Notes (Signed)
Patient states pharmacy does not have her prep. Sent medication the pharmacy

## 2021-07-16 ENCOUNTER — Encounter: Payer: Self-pay | Admitting: Gastroenterology

## 2021-07-17 ENCOUNTER — Encounter
Admission: RE | Disposition: A | Discharge status: Home / Self Care | Payer: Self-pay | Source: Home / Self Care | Attending: Gastroenterology

## 2021-07-17 ENCOUNTER — Encounter: Payer: Self-pay | Admitting: Gastroenterology

## 2021-07-17 ENCOUNTER — Ambulatory Visit: Payer: Managed Care, Other (non HMO) | Admitting: Anesthesiology

## 2021-07-17 ENCOUNTER — Telehealth: Payer: Self-pay

## 2021-07-17 ENCOUNTER — Ambulatory Visit
Admission: RE | Admit: 2021-07-17 | Discharge: 2021-07-17 | Disposition: A | Discharge status: Home / Self Care | Payer: Managed Care, Other (non HMO) | Attending: Gastroenterology | Admitting: Gastroenterology

## 2021-07-17 DIAGNOSIS — Z7982 Long term (current) use of aspirin: Secondary | ICD-10-CM | POA: Insufficient documentation

## 2021-07-17 DIAGNOSIS — Z79899 Other long term (current) drug therapy: Secondary | ICD-10-CM | POA: Insufficient documentation

## 2021-07-17 DIAGNOSIS — Z91048 Other nonmedicinal substance allergy status: Secondary | ICD-10-CM | POA: Diagnosis not present

## 2021-07-17 DIAGNOSIS — Z09 Encounter for follow-up examination after completed treatment for conditions other than malignant neoplasm: Secondary | ICD-10-CM | POA: Diagnosis present

## 2021-07-17 DIAGNOSIS — Z86010 Personal history of colonic polyps: Secondary | ICD-10-CM

## 2021-07-17 DIAGNOSIS — K621 Rectal polyp: Secondary | ICD-10-CM | POA: Insufficient documentation

## 2021-07-17 DIAGNOSIS — F17210 Nicotine dependence, cigarettes, uncomplicated: Secondary | ICD-10-CM | POA: Insufficient documentation

## 2021-07-17 DIAGNOSIS — K5730 Diverticulosis of large intestine without perforation or abscess without bleeding: Secondary | ICD-10-CM | POA: Diagnosis not present

## 2021-07-17 DIAGNOSIS — K635 Polyp of colon: Secondary | ICD-10-CM

## 2021-07-17 HISTORY — PX: COLONOSCOPY: SHX5424

## 2021-07-17 SURGERY — COLONOSCOPY
Anesthesia: Monitor Anesthesia Care

## 2021-07-17 MED ORDER — ONDANSETRON HCL 4 MG/2ML IJ SOLN: 4.0000 mg | Freq: Once | INTRAMUSCULAR | Status: DC | PRN

## 2021-07-17 MED ORDER — LIDOCAINE HCL (PF) 2 % IJ SOLN
INTRAMUSCULAR | Status: AC
  Filled 2021-07-17: qty 5

## 2021-07-17 MED ORDER — LIDOCAINE HCL (CARDIAC) PF 100 MG/5ML IV SOSY
PREFILLED_SYRINGE | INTRAVENOUS | Status: DC | PRN
  Administered 2021-07-17: 40 mg via INTRAVENOUS

## 2021-07-17 MED ORDER — SODIUM CHLORIDE 0.9 % IV SOLN
INTRAVENOUS | Status: DC
  Administered 2021-07-17: 20 mL/h via INTRAVENOUS

## 2021-07-17 MED ORDER — PROPOFOL 10 MG/ML IV BOLUS
INTRAVENOUS | Status: DC | PRN
  Administered 2021-07-17 (×2): 40 mg via INTRAVENOUS
  Administered 2021-07-17: 100 mg via INTRAVENOUS
  Administered 2021-07-17 (×3): 40 mg via INTRAVENOUS

## 2021-07-17 MED ORDER — PROPOFOL 500 MG/50ML IV EMUL
INTRAVENOUS | Status: AC
  Filled 2021-07-17: qty 50

## 2021-07-17 NOTE — H&P (Signed)
[name], MD [address] [place], [place], [place], [address] [address], [place], [place], [place], [address] Phone: [phone]  Fax: [phone]  Primary Care Physician:  Trinna Post, PA-C   Pre-Procedure History & Physical: HPI:  Stephanie Jacobs is a 55 y.o. female is here for an colonoscopy.   Past Medical History:  Diagnosis Date   Hypertension    Sickle cell trait [facility])     Past Surgical History:  Procedure Laterality Date   ABDOMINAL HYSTERECTOMY     BREAST BIOPSY Right 05/02/2018   Affirm Bx- Fibrocystic breast tissue - coil clip   COLONOSCOPY WITH PROPOFOL N/A 02/22/2020   Procedure: COLONOSCOPY WITH PROPOFOL;  Surgeon: [name], MD;  Location: [facility] ENDOSCOPY;  Service: Gastroenterology;  Laterality: N/A;   CYSTOSCOPY  08/07/2019   Procedure: CYSTOSCOPY;  Surgeon: Gae Dry, MD;  Location: [facility] ORS;  Service: Gynecology;;   DIAGNOSTIC LAPAROSCOPY     KNEE ARTHROSCOPY WITH MEDIAL MENISECTOMY Right 08/21/2020   Procedure: RIGHT KNEE ARTHROSCOPY WITH MEDIAL MENISECTOMY;  Surgeon: Thornton Park, MD;  Location: [facility] ORS;  Service: Orthopedics;  Laterality: Right;   TOTAL LAPAROSCOPIC HYSTERECTOMY WITH BILATERAL SALPINGO OOPHORECTOMY N/A 08/07/2019   Procedure: TOTAL LAPAROSCOPIC HYSTERECTOMY WITH BILATERAL SALPINGO;  Surgeon: Gae Dry, MD;  Location: [facility] ORS;  Service: Gynecology;  Laterality: N/A;   TUBAL LIGATION  1995    Prior to Admission medications   Medication Sig Start Date End Date Taking? Authorizing Provider  amLODipine (NORVASC) 10 MG tablet Take 1 tablet (10 mg total) by mouth daily. 07/03/21  Yes Bacigalupo, Dionne Bucy, MD  aspirin EC 325 MG tablet Take 1 tablet (325 mg total) by mouth daily. 08/21/20  Yes Thornton Park, MD  losartan (COZAAR) 50 MG tablet Take 1 tablet (50 mg total) by mouth daily. 07/02/21  Yes Bacigalupo, Dionne Bucy, MD  metoprolol tartrate (LOPRESSOR) 25 MG tablet TAKE 1/2 TABLET TWICE A DAY. IF BP IS  STILL > 120/80 AFTER TWO WKS, INCREASE TO 1 TABLET TWICE DAILY 07/03/21  Yes Bacigalupo, Dionne Bucy, MD  Multiple Vitamin (MULTIVITAMIN) tablet Take 1 tablet by mouth daily.   Yes [provider]  valACYclovir (VALTREX) 500 MG tablet Take 1 tablet (500 mg total) by mouth daily. Patient not taking: Reported on 07/02/2021 [id number]   Copland, Alicia B, PA-C    Allergies as of 07/03/2021 - Review Complete 07/02/2021  Allergen Reaction Noted   Tape Rash 05/07/2015    Family History  Problem Relation Age of Onset   Hypertension Mother    Diabetes Mother    Sickle cell anemia Brother    Sickle cell trait Brother    Breast cancer Neg Hx     Social History   Socioeconomic History   Marital status: Married    Spouse name: Not on file   Number of children: Not on file   Years of education: Not on file   Highest education level: Not on file  Occupational History   Not on file  Tobacco Use   Smoking status: Every Day    Packs/day: 0.50    Types: Cigarettes   Smokeless tobacco: Never  Vaping Use   Vaping Use: Never used  Substance and Sexual Activity   Alcohol use: Yes    Alcohol/week: 7.0 standard drinks    Types: 7 Glasses of wine per week    Comment: One glass of red wine in the evenings.   Drug use: No   Sexual  activity: Yes    Birth control/protection: Surgical    Comment: Hysterectomy  Other Topics Concern   Not on file  Social History Narrative   Not on file   Social Determinants of Health   Financial Resource Strain: Not on file  Food Insecurity: Not on file  Transportation Needs: Not on file  Physical Activity: Not on file  Stress: Not on file  Social Connections: Not on file  Intimate Partner Violence: Not on file    Review of Systems: See HPI, otherwise negative ROS  Physical Exam: BP (!) 146/100   Pulse 63   Temp 97.6 F (36.4 C) (Temporal)   Resp 20   Ht '5\' 3"'$  (1.6 m)   Wt 88.9 kg   LMP 07/21/2019 (Exact Date)   SpO2 100%   BMI 34.72 kg/m   General:   Alert,  pleasant and cooperative in NAD Head:  Normocephalic and atraumatic. Neck:  Supple; no masses or thyromegaly. Lungs:  Clear throughout to auscultation, normal respiratory effort.    Heart:  +S1, +S2, Regular rate and rhythm, No edema. Abdomen:  Soft, nontender and nondistended. Normal bowel sounds, without guarding, and without rebound.   Neurologic:  Alert and  oriented x4;  grossly normal neurologically.  Impression/Plan: [name] is here for an colonoscopy to be performed for Screening colonoscopy , last colonoscopy had poor prep  Risks, benefits, limitations, and alternatives regarding  colonoscopy have been reviewed with the patient.  Questions have been answered.  All parties agreeable.   [name], MD  07/17/2021, 10:22 AM

## 2021-07-17 NOTE — Op Note (Signed)
[facility] Gastroenterology Patient Name: Stephanie Jacobs Procedure Date: 07/17/2021 10:23 AM MRN: [id number] Account #: [id number] Date of Birth: 1966/12/07 Admit Type: Outpatient Age: 55 Room: [facility] ENDO ROOM 4 Gender: Female Note Status: Finalized Procedure:             Colonoscopy Indications:           Surveillance: Personal history of adenomatous polyps,                         inadequate prep on last colonoscopy (less than 1 year                         ago), Last colonoscopy: March 2021 Providers:             [name] MD, MD Referring MD:          Wendee Beavers. Terrilee Croak (Referring MD) Medicines:             Monitored Anesthesia Care Complications:         No immediate complications. Procedure:             Pre-Anesthesia Assessment:                        - Prior to the procedure, a History and Physical was                         performed, and patient medications, allergies and                         sensitivities were reviewed. The patient's tolerance                         of previous anesthesia was reviewed.                        - The risks and benefits of the procedure and the                         sedation options and risks were discussed with the                         patient. All questions were answered and informed                         consent was obtained.                        - ASA Grade Assessment: II - A patient with mild                         systemic disease.                        After obtaining informed consent, the colonoscope was                         passed under direct vision. Throughout the procedure,                         the patient's blood pressure,  pulse, and oxygen                         saturations were monitored continuously. The                         Colonoscope was introduced through the anus and                         advanced to the the cecum, identified by the                         appendiceal  orifice. The colonoscopy was performed                         with ease. The patient tolerated the procedure well.                         The quality of the bowel preparation was adequate. Findings:      The perianal and digital rectal examinations were normal.      Multiple sessile polyps were found in the rectum and sigmoid colon. The       polyps were 4 to 6 mm in size. These polyps were removed with a cold       snare. Resection and retrieval were complete.      Multiple small-mouthed diverticula were found in the entire colon.      The exam was otherwise without abnormality on direct and retroflexion       views. Impression:            - Multiple 4 to 6 mm polyps in the rectum and in the                         sigmoid colon, removed with a cold snare. Resected and                         retrieved.                        - Diverticulosis in the entire examined colon.                        - The examination was otherwise normal on direct and                         retroflexion views. Recommendation:        - Discharge patient to home (with escort).                        - Resume previous diet.                        - Continue present medications.                        - Await pathology results.                        - Repeat colonoscopy in 3 years for surveillance. Procedure Code(s):     --- Professional ---  45385, Colonoscopy, flexible; with removal of                         tumor(s), polyp(s), or other lesion(s) by snare                         technique Diagnosis Code(s):     --- Professional ---                        Z86.010, Personal history of colonic polyps                        K62.1, Rectal polyp                        K63.5, Polyp of colon                        K57.30, Diverticulosis of large intestine without                         perforation or abscess without bleeding CPT copyright 2019 American Medical Association. All rights  reserved. The codes documented in this report are preliminary and upon coder review may  be revised to meet current compliance requirements. [name], MD [name] MD, MD 07/17/2021 10:47:17 AM This report has been signed electronically. Number of Addenda: 0 Note Initiated On: 07/17/2021 10:23 AM Scope Withdrawal Time: 0 hours 9 minutes 25 seconds  Total Procedure Duration: 0 hours 13 minutes 52 seconds  Estimated Blood Loss:  Estimated blood loss: none.      [facility]

## 2021-07-17 NOTE — Anesthesia Postprocedure Evaluation (Signed)
Anesthesia Post Note  Patient: Stephanie Jacobs  Procedure(s) Performed: COLONOSCOPY  Patient location during evaluation: PACU Anesthesia Type: MAC Level of consciousness: awake and alert Pain management: pain level controlled Vital Signs Assessment: post-procedure vital signs reviewed and stable Respiratory status: spontaneous breathing Cardiovascular status: blood pressure returned to baseline Postop Assessment: no headache Anesthetic complications: no   No notable events documented.   Last Vitals:  Vitals:   07/17/21 1050 07/17/21 1100  BP: 114/80 129/86  Pulse: (!) 59 (!) 56  Resp: 14 13  Temp:    SpO2: 94% 96%    Last Pain:  Vitals:   07/17/21 1040  TempSrc: Temporal  PainSc:                  Milinda Pointer

## 2021-07-17 NOTE — Anesthesia Preprocedure Evaluation (Signed)
Anesthesia Evaluation  Patient identified by MRN, date of birth, ID band Patient awake    Reviewed: Allergy & Precautions, H&P , NPO status , Patient's Chart, lab work & pertinent test results  History of Anesthesia Complications Negative for: history of anesthetic complications  Airway Mallampati: II  TM Distance: >3 FB     Dental  (+) Teeth Intact   Pulmonary neg sleep apnea, neg COPD, Current SmokerPatient did not abstain from smoking.,    Pulmonary exam normal breath sounds clear to auscultation- rhonchi (-) wheezing      Cardiovascular hypertension, (-) angina(-) Past MI and (-) Cardiac Stents (-) dysrhythmias  Rhythm:regular Rate:Normal - Systolic murmurs and - Diastolic murmurs    Neuro/Psych PSYCHIATRIC DISORDERS Anxiety negative neurological ROS     GI/Hepatic negative GI ROS, (+)     substance abuse (ETOH abuse in chart; pt reports 2 glasses wine daily)  alcohol use,   Endo/Other  negative endocrine ROS  Renal/GU      Musculoskeletal   Abdominal   Peds  Hematology negative hematology ROS (+)   Anesthesia Other Findings Past Medical History: No date: Hypertension No date: Sickle cell trait ([place])  Past Surgical History: No date: ABDOMINAL HYSTERECTOMY 05/02/2018: BREAST BIOPSY; Right     Comment:  Affirm Bx- Fibrocystic breast tissue - coil clip 02/22/2020: COLONOSCOPY WITH PROPOFOL; N/A     Comment:  Procedure: COLONOSCOPY WITH PROPOFOL;  Surgeon: [name], MD;  Location: [facility] ENDOSCOPY;  Service:               Gastroenterology;  Laterality: N/A; 08/07/2019: CYSTOSCOPY     Comment:  Procedure: CYSTOSCOPY;  Surgeon: Gae Dry, MD;                Location: [facility] ORS;  Service: Gynecology;; No date: DIAGNOSTIC LAPAROSCOPY 08/07/2019: TOTAL LAPAROSCOPIC HYSTERECTOMY WITH BILATERAL SALPINGO  OOPHORECTOMY; N/A     Comment:  Procedure: TOTAL LAPAROSCOPIC HYSTERECTOMY WITH                BILATERAL SALPINGO;  Surgeon: Gae Dry, MD;                Location: [facility] ORS;  Service: Gynecology;  Laterality:               N/A; 1995: TUBAL LIGATION  BMI    Body Mass Index: 34.72 kg/m      Reproductive/Obstetrics negative OB ROS                             Anesthesia Physical  Anesthesia Plan  ASA: 2  Anesthesia Plan: MAC   Post-op Pain Management:    Induction: Intravenous  PONV Risk Score and Plan: Ondansetron, Propofol infusion and TIVA  Airway Management Planned: Mask and Natural Airway  Additional Equipment:   Intra-op Plan:   Post-operative Plan:   Informed Consent: I have reviewed the patients History and Physical, chart, labs and discussed the procedure including the risks, benefits and alternatives for the proposed anesthesia with the patient or authorized representative who has indicated his/her understanding and acceptance.     Dental Advisory Given  Plan Discussed with: Anesthesiologist and CRNA  Anesthesia Plan Comments: (IVGA, PIV x 1, ASA SM, RBO discussed, Consent signed.)        Anesthesia Quick Evaluation

## 2021-07-17 NOTE — Telephone Encounter (Signed)
[facility] faxed refill request for the following medications:  Losartan 100 mg  I do not see this dosage on medication list  Please advise.

## 2021-07-17 NOTE — Transfer of Care (Signed)
Immediate Anesthesia Transfer of Care Note  Patient: Stephanie Jacobs  Procedure(s) Performed: COLONOSCOPY  Patient Location: Endoscopy Unit  Anesthesia Type:MAC  Level of Consciousness: drowsy  Airway & Oxygen Therapy: Patient Spontanous Breathing and Patient connected to nasal cannula oxygen  Post-op Assessment: Report given to RN and Post -op Vital signs reviewed and stable  Post vital signs: Reviewed and stable  Last Vitals:  Vitals Value Taken Time  BP    Temp    Pulse    Resp    SpO2      Last Pain:  Vitals:   07/17/21 1002  TempSrc: Temporal  PainSc: 0-No pain         Complications: No notable events documented.

## 2021-07-20 ENCOUNTER — Ambulatory Visit (INDEPENDENT_AMBULATORY_CARE_PROVIDER_SITE_OTHER): Payer: Managed Care, Other (non HMO) | Admitting: Obstetrics & Gynecology

## 2021-07-20 ENCOUNTER — Other Ambulatory Visit: Payer: Self-pay

## 2021-07-20 ENCOUNTER — Other Ambulatory Visit (HOSPITAL_COMMUNITY)
Admission: RE | Admit: 2021-07-20 | Discharge: 2021-07-20 | Disposition: A | Discharge status: Home / Self Care | Payer: Managed Care, Other (non HMO) | Source: Ambulatory Visit | Attending: Obstetrics & Gynecology | Admitting: Obstetrics & Gynecology

## 2021-07-20 ENCOUNTER — Encounter: Payer: Self-pay | Admitting: Obstetrics & Gynecology

## 2021-07-20 VITALS — BP 140/90 | Ht 63.0 in | Wt 196.0 lb

## 2021-07-20 DIAGNOSIS — N760 Acute vaginitis: Secondary | ICD-10-CM | POA: Insufficient documentation

## 2021-07-20 DIAGNOSIS — B9689 Other specified bacterial agents as the cause of diseases classified elsewhere: Secondary | ICD-10-CM | POA: Diagnosis not present

## 2021-07-20 DIAGNOSIS — N898 Other specified noninflammatory disorders of vagina: Secondary | ICD-10-CM | POA: Diagnosis not present

## 2021-07-20 LAB — SURGICAL PATHOLOGY

## 2021-07-20 MED ORDER — METRONIDAZOLE 500 MG PO TABS: 500.0000 mg | ORAL_TABLET | Freq: Two times a day (BID) | ORAL | 0 refills | Status: DC

## 2021-07-20 NOTE — Progress Notes (Signed)
HPI:      Ms. Stephanie Jacobs is a 55 y.o. G6P4 who LMP was Patient's last menstrual period was 07/21/2019 (exact date)., presents today for a problem visit.  She complains of:  Vaginitis: Patient complains of an abnormal vaginal discharge for 2 days. Vaginal symptoms include burning, local irritation, and odor.Vulvar symptoms include none.STI Risk: Possible STD exposureDischarge described as: brown, thin, and malodorous.Other associated symptoms: none.  Treated one month ago w success.  Culture pos for BV and trich. Sx's returned 3 days after sex again w husband.  He has not been treated.  PMHx: She  has a past medical history of Hypertension and Sickle cell trait ([place]). Also,  has a past surgical history that includes Tubal ligation (1995); Total laparoscopic hysterectomy with bilateral salpingo oophorectomy (N/A, 08/07/2019); Cystoscopy (08/07/2019); Diagnostic laparoscopy; Abdominal hysterectomy; Colonoscopy with propofol (N/A, 02/22/2020); Breast biopsy (Right, 05/02/2018); Knee arthroscopy with medial menisectomy (Right, 08/21/2020); and Colonoscopy (N/A, 07/17/2021)., family history includes Diabetes in her mother; Hypertension in her mother; Sickle cell anemia in her brother; Sickle cell trait in her brother.,  reports that she has been smoking cigarettes. She has been smoking an average of .5 packs per day. She has never used smokeless tobacco. She reports current alcohol use of about 7.0 standard drinks of alcohol per week. She reports that she does not use drugs.  She has a current medication list which includes the following prescription(s): amlodipine, aspirin ec, losartan, metoprolol tartrate, metronidazole, multivitamin, and valacyclovir. Also, is allergic to tape.  Review of Systems  All other systems reviewed and are negative.  Objective: BP 140/90   Ht '5\' 3"'$  (1.6 m)   Wt 196 lb (88.9 kg)   LMP 07/21/2019 (Exact Date)   BMI 34.72 kg/m  Physical Exam Constitutional:      General:  She is not in acute distress.    Appearance: She is well-developed.  Genitourinary:     Genitourinary Comments: Cuff intact/ no lesions  Absent uterus and cervix     No vaginal erythema or bleeding.  HENT:     Head: Normocephalic and atraumatic.     Nose: Nose normal.  Abdominal:     General: There is no distension.     Palpations: Abdomen is soft.     Tenderness: There is no abdominal tenderness.  Musculoskeletal:        General: Normal range of motion.  Neurological:     Mental Status: She is alert and oriented to person, place, and time.     Cranial Nerves: No cranial nerve deficit.  Skin:    General: Skin is warm and dry.  Psychiatric:        Attention and Perception: Attention normal.        Mood and Affect: Mood normal.        Speech: Speech normal.        Behavior: Behavior normal.        Cognition and Memory: Cognition normal.        Judgment: Judgment normal.    ASSESSMENT/PLAN:    Problem List Items Addressed This Visit       Genitourinary   BV (bacterial vaginosis) - Primary   Relevant Medications   metroNIDAZOLE (FLAGYL) 500 MG tablet   Other Relevant Orders   Cervicovaginal ancillary only   Other Visit Diagnoses     Vaginal discharge         Rec husband testing and treatment as well  Barnett Applebaum, MD, [facility] Ob/Gyn,  [facility] 07/20/2021  4:26 PM

## 2021-07-21 ENCOUNTER — Other Ambulatory Visit: Payer: Self-pay | Admitting: Family Medicine

## 2021-07-21 DIAGNOSIS — I10 Essential (primary) hypertension: Secondary | ICD-10-CM

## 2021-07-21 MED ORDER — LOSARTAN POTASSIUM 50 MG PO TABS: 50.0000 mg | ORAL_TABLET | Freq: Every day | ORAL | 1 refills | Status: DC

## 2021-07-21 NOTE — Telephone Encounter (Signed)
[facility] faxed refill request for the following medications:   losartan (COZAAR) 100 MG tablet     Please advise.  

## 2021-07-21 NOTE — Telephone Encounter (Signed)
Please review.  It looks like her losartan was reduce to '50mg'$  06/29/2021.    Thanks,   -Mickel Baas

## 2021-07-22 ENCOUNTER — Other Ambulatory Visit: Payer: Self-pay | Admitting: Obstetrics & Gynecology

## 2021-07-22 LAB — CERVICOVAGINAL ANCILLARY ONLY
Bacterial Vaginitis (gardnerella): POSITIVE — AB
Candida Glabrata: NEGATIVE
Candida Vaginitis: NEGATIVE
Chlamydia: NEGATIVE
Comment: NEGATIVE
Comment: NEGATIVE
Comment: NEGATIVE
Comment: NEGATIVE
Comment: NEGATIVE
Comment: NORMAL
Neisseria Gonorrhea: NEGATIVE
Trichomonas: POSITIVE — AB

## 2021-07-22 MED ORDER — METRONIDAZOLE 500 MG PO TABS: 500.0000 mg | ORAL_TABLET | Freq: Two times a day (BID) | ORAL | 0 refills | Status: DC

## 2021-08-18 ENCOUNTER — Encounter: Payer: Self-pay | Admitting: Gastroenterology

## 2021-10-02 ENCOUNTER — Other Ambulatory Visit: Payer: Self-pay

## 2021-10-02 ENCOUNTER — Encounter: Payer: Self-pay | Admitting: Family Medicine

## 2021-10-02 ENCOUNTER — Ambulatory Visit (INDEPENDENT_AMBULATORY_CARE_PROVIDER_SITE_OTHER): Payer: Managed Care, Other (non HMO) | Admitting: Family Medicine

## 2021-10-02 VITALS — BP 168/114 | HR 81 | Temp 98.3°F | Resp 16 | Ht 63.0 in | Wt 199.0 lb

## 2021-10-02 DIAGNOSIS — I10 Essential (primary) hypertension: Secondary | ICD-10-CM | POA: Diagnosis not present

## 2021-10-02 DIAGNOSIS — D573 Sickle-cell trait: Secondary | ICD-10-CM | POA: Diagnosis not present

## 2021-10-02 DIAGNOSIS — Z638 Other specified problems related to primary support group: Secondary | ICD-10-CM

## 2021-10-02 DIAGNOSIS — F419 Anxiety disorder, unspecified: Secondary | ICD-10-CM

## 2021-10-02 DIAGNOSIS — Z6835 Body mass index (BMI) 35.0-35.9, adult: Secondary | ICD-10-CM

## 2021-10-02 NOTE — Assessment & Plan Note (Signed)
Mom, who has new dx'd dementia, has moved in their home Also, pt is caregiver for 53 year old granddaughter- who had to give up room for grandmother Recommend self care techniques to assist with both

## 2021-10-02 NOTE — Assessment & Plan Note (Signed)
BMI 35 Discussed importance of healthy weight management Discussed diet and exercise

## 2021-10-02 NOTE — Assessment & Plan Note (Signed)
Relationship issues with spouse- infidelity likely given STI exposure Now reconciliation Refused medication CTM

## 2021-10-02 NOTE — Assessment & Plan Note (Signed)
-  htn -sickle cell trait -Discussed importance of healthy weight management Discussed diet and exercise

## 2021-10-02 NOTE — Assessment & Plan Note (Signed)
No current complaints; continue to monitor

## 2021-10-02 NOTE — Progress Notes (Signed)
I,April Miller,acting as a scribe for Gwyneth Sprout, FNP.,have documented all relevant documentation on the behalf of Gwyneth Sprout, FNP,as directed by  Gwyneth Sprout, FNP while in the presence of Gwyneth Sprout, FNP.   Established patient visit   Patient: Stephanie Jacobs   DOB: 1966-01-13   55 y.o. Female  MRN: [id number] Visit Date: 10/02/2021  Today's healthcare provider: Gwyneth Sprout, FNP   Chief Complaint  Patient presents with   Follow-up   Hypertension   Subjective    HPI  Hypertension, follow-up  BP Readings from Last 3 Encounters:  10/02/21 (!) 168/114  07/20/21 140/90  07/17/21 129/86   Wt Readings from Last 3 Encounters:  10/02/21 199 lb (90.3 kg)  07/20/21 196 lb (88.9 kg)  07/17/21 196 lb (88.9 kg)     She was last seen for hypertension 3 months ago.  BP at that visit was 149/93. Management since that visit includes; continue amlodipine and metoprolol. Restarted losartan.  She reports good compliance with treatment. She is not having side effects. none She is not exercising. She is not adherent to low salt diet.   Outside blood pressures are not checking.  She does not smoke.  Use of agents associated with hypertension: none.   ---------------------------------------------------------------------------------------------------     Medications: Outpatient Medications Prior to Visit  Medication Sig   amLODipine (NORVASC) 10 MG tablet Take 1 tablet (10 mg total) by mouth daily.   losartan (COZAAR) 50 MG tablet Take 1 tablet (50 mg total) by mouth daily.   metoprolol tartrate (LOPRESSOR) 25 MG tablet TAKE 1/2 TABLET TWICE A DAY. IF BP IS STILL > 120/80 AFTER TWO WKS, INCREASE TO 1 TABLET TWICE DAILY   Multiple Vitamin (MULTIVITAMIN) tablet Take 1 tablet by mouth daily.   valACYclovir (VALTREX) 500 MG tablet Take 1 tablet (500 mg total) by mouth daily.   [DISCONTINUED] aspirin EC 325 MG tablet Take 1 tablet (325 mg total) by mouth daily.    [DISCONTINUED] metroNIDAZOLE (FLAGYL) 500 MG tablet Take 1 tablet (500 mg total) by mouth 2 (two) times daily.   No facility-administered medications prior to visit.    Review of Systems  Constitutional:  Negative for appetite change, chills, fatigue and fever.  Respiratory:  Negative for chest tightness and shortness of breath.   Cardiovascular:  Negative for chest pain and palpitations.  Gastrointestinal:  Negative for abdominal pain, nausea and vomiting.  Neurological:  Negative for dizziness and weakness.      Objective    BP (!) 168/114 (BP Location: Left Arm, Patient Position: Sitting, Cuff Size: Large)   Pulse 81   Temp 98.3 F (36.8 C) (Temporal)   Resp 16   Ht 5\' 3"  (1.6 m)   Wt 199 lb (90.3 kg)   LMP 07/21/2019 (Exact Date)   SpO2 96%   BMI 35.25 kg/m  {Show previous vital signs (optional):23777}  Physical Exam Vitals and nursing note reviewed.  Constitutional:      General: She is not in acute distress.    Appearance: Normal appearance. She is obese. She is not ill-appearing, toxic-appearing or diaphoretic.  HENT:     Head: Normocephalic and atraumatic.  Cardiovascular:     Rate and Rhythm: Normal rate and regular rhythm.     Pulses: Normal pulses.     Heart sounds: Normal heart sounds. No murmur heard.   No friction rub. No gallop.  Pulmonary:     Effort: Pulmonary effort is normal. No  respiratory distress.     Breath sounds: Normal breath sounds. No stridor. No wheezing, rhonchi or rales.  Chest:     Chest wall: No tenderness.  Abdominal:     General: Bowel sounds are normal.     Palpations: Abdomen is soft.  Musculoskeletal:        General: No swelling, tenderness, deformity or signs of injury. Normal range of motion.     Right lower leg: No edema.     Left lower leg: No edema.  Skin:    General: Skin is warm and dry.     Capillary Refill: Capillary refill takes less than 2 seconds.     Coloration: Skin is not jaundiced or pale.     Findings: No  bruising, erythema, lesion or rash.  Neurological:     General: No focal deficit present.     Mental Status: She is alert and oriented to person, place, and time. Mental status is at baseline.     Cranial Nerves: No cranial nerve deficit.     Sensory: No sensory deficit.     Motor: No weakness.     Coordination: Coordination normal.  Psychiatric:        Mood and Affect: Mood normal.        Behavior: Behavior normal.        Thought Content: Thought content normal.        Judgment: Judgment normal.     No results found for any visits on 10/02/21.  Assessment & Plan      Problem List Items Addressed This Visit       Cardiovascular and Mediastinum   Essential (primary) hypertension - Primary    On 3 agents; has not been taking routinely Recommend use of alarms, adding to purse, putting next to toothbrush, etc to ensure compliance Recommend RTC for RN check of BP alone or send copy of Bps to mychart        Other   Anxiety    Relationship issues with spouse- infidelity likely given STI exposure Now reconciliation Refused medication CTM       Sickle cell trait ([facility])    No current complaints; continue to monitor      Caregiver role strain    Mom, who has new dx'd dementia, has moved in their home Also, pt is caregiver for 85 year old granddaughter- who had to give up room for grandmother Recommend self care techniques to assist with both      BMI 35.0-35.9,adult    BMI 35 Discussed importance of healthy weight management Discussed diet and exercise       Morbid obesity ([place])    -htn -sickle cell trait -Discussed importance of healthy weight management Discussed diet and exercise         Return in about 3 months (around 01/02/2022) for HTN management, chonic disease management.      Vonna Kotyk, FNP, have reviewed all documentation for this visit. The documentation on 10/02/21 for the exam, diagnosis, procedures, and orders are all accurate and  complete.    Gwyneth Sprout, [place] [phone] (phone) [phone] (fax)  [place]

## 2021-10-02 NOTE — Assessment & Plan Note (Signed)
On 3 agents; has not been taking routinely Recommend use of alarms, adding to purse, putting next to toothbrush, etc to ensure compliance Recommend RTC for RN check of BP alone or send copy of Bps to [facility]

## 2021-11-11 ENCOUNTER — Telehealth: Payer: Self-pay | Admitting: Family Medicine

## 2021-11-11 DIAGNOSIS — I10 Essential (primary) hypertension: Secondary | ICD-10-CM

## 2021-11-11 NOTE — Telephone Encounter (Signed)
Request for refill is to soon patient should still have one refill to fill. KW

## 2021-11-11 NOTE — Telephone Encounter (Signed)
[facility] faxed refill request for the following medications:  losartan (COZAAR) 50 MG tablet   Please advise.

## 2021-11-23 ENCOUNTER — Telehealth: Payer: Self-pay

## 2021-11-23 DIAGNOSIS — I10 Essential (primary) hypertension: Secondary | ICD-10-CM

## 2021-11-23 MED ORDER — METOPROLOL TARTRATE 25 MG PO TABS
ORAL_TABLET | ORAL | 1 refills | Status: DC
Start: 2021-11-23 — End: 2022-08-20

## 2021-11-23 MED ORDER — LOSARTAN POTASSIUM 50 MG PO TABS: 50.0000 mg | ORAL_TABLET | Freq: Every day | ORAL | 1 refills | Status: DC

## 2021-11-23 MED ORDER — AMLODIPINE BESYLATE 10 MG PO TABS: 10.0000 mg | ORAL_TABLET | Freq: Every day | ORAL | 1 refills | Status: DC

## 2021-11-23 NOTE — Telephone Encounter (Signed)
Pharmacy also requesting Valacyclovir HCL tabs 500MG , 90 day supply 3 refills,  Metoprolol Tartrate Tabs 25 MG 90 day supply 3 refill along with  Amlodipine Besylate Tabs 10 MG 90 day supply w/ 3 refills.

## 2021-11-23 NOTE — Addendum Note (Signed)
Addended by: Minette Headland on: 11/23/2021 10:48 AM   Modules accepted: Orders

## 2021-11-23 NOTE — Telephone Encounter (Signed)
Received second request today.  Please follow up with patient to confirm.

## 2021-11-23 NOTE — Telephone Encounter (Signed)
Pharmacy also requesting Metoprolol Tartrate Tabs 25 MG 90 day supply 3 refill along with  Amlodipine Besylate Tabs 10 MG 90 day supply w/ 3 refills.

## 2021-11-23 NOTE — Telephone Encounter (Signed)
Pharmacy requesting Amlodipine Besylate Tabs 10 MG 90 day supply w/ 3 refills

## 2021-11-26 ENCOUNTER — Telehealth: Payer: Self-pay | Admitting: Family Medicine

## 2021-11-26 NOTE — Telephone Encounter (Signed)
[facility] faxed refill request for the following medications:  losartan (COZAAR) 50 MG tablet   valACYclovir (VALTREX) 500 MG tablet   Please advise.

## 2021-11-26 NOTE — Telephone Encounter (Signed)
Losartan 11/23/21 #90 rf 1 Valtrex 05/29/21 #90 rf 3 by OB/GYN

## 2022-06-18 NOTE — Progress Notes (Deleted)
Complete physical exam   Patient: Stephanie Jacobs   DOB: 1966/09/26   56 y.o. Female  MRN: [id number] Visit Date: 07/06/2022  Today's healthcare provider: Gwyneth Sprout, FNP   No chief complaint on file.  Subjective    Stephanie Jacobs is a 56 y.o. female who presents today for a complete physical exam.  She reports consuming a {diet types:17450} diet. {Exercise:19826} She generally feels {well/fairly well/poorly:18703}. She reports sleeping {well/fairly well/poorly:18703}. She {does/does not:200015} have additional problems to discuss today.  HPI  ***  Past Medical History:  Diagnosis Date   Hypertension    Sickle cell trait [facility])    Past Surgical History:  Procedure Laterality Date   ABDOMINAL HYSTERECTOMY     BREAST BIOPSY Right 05/02/2018   Affirm Bx- Fibrocystic breast tissue - coil clip   COLONOSCOPY N/A 07/17/2021   Procedure: COLONOSCOPY;  Surgeon: [name], MD;  Location: [facility] ENDOSCOPY;  Service: Gastroenterology;  Laterality: N/A;   COLONOSCOPY WITH PROPOFOL N/A 02/22/2020   Procedure: COLONOSCOPY WITH PROPOFOL;  Surgeon: [name], MD;  Location: [facility] ENDOSCOPY;  Service: Gastroenterology;  Laterality: N/A;   CYSTOSCOPY  08/07/2019   Procedure: CYSTOSCOPY;  Surgeon: Gae Dry, MD;  Location: [facility] ORS;  Service: Gynecology;;   DIAGNOSTIC LAPAROSCOPY     KNEE ARTHROSCOPY WITH MEDIAL MENISECTOMY Right 08/21/2020   Procedure: RIGHT KNEE ARTHROSCOPY WITH MEDIAL MENISECTOMY;  Surgeon: Thornton Park, MD;  Location: [facility] ORS;  Service: Orthopedics;  Laterality: Right;   TOTAL LAPAROSCOPIC HYSTERECTOMY WITH BILATERAL SALPINGO OOPHORECTOMY N/A 08/07/2019   Procedure: TOTAL LAPAROSCOPIC HYSTERECTOMY WITH BILATERAL SALPINGO;  Surgeon: Gae Dry, MD;  Location: [facility] ORS;  Service: Gynecology;  Laterality: N/A;   TUBAL LIGATION  1995   Social History   Socioeconomic History   Marital status: Married    Spouse name: Not on file   Number of children: Not on  file   Years of education: Not on file   Highest education level: Not on file  Occupational History   Not on file  Tobacco Use   Smoking status: Every Day    Packs/day: 0.50    Types: Cigarettes   Smokeless tobacco: Never  Vaping Use   Vaping Use: Never used  Substance and Sexual Activity   Alcohol use: Yes    Alcohol/week: 7.0 standard drinks of alcohol    Types: 7 Glasses of wine per week    Comment: One glass of red wine in the evenings.   Drug use: No   Sexual activity: Yes    Birth control/protection: Surgical    Comment: Hysterectomy  Other Topics Concern   Not on file  Social History Narrative   Not on file   Social Determinants of Health   Financial Resource Strain: Not on file  Food Insecurity: Not on file  Transportation Needs: Not on file  Physical Activity: Not on file  Stress: Not on file  Social Connections: Not on file  Intimate Partner Violence: Not on file   Family Status  Relation Name Status   Mother  Alive   Father  Alive   Brother  Deceased   Brother  (Not Specified)   Neg Hx  (Not Specified)   Family History  Problem Relation Age of Onset   Hypertension Mother    Diabetes Mother    Sickle cell anemia Brother    Sickle cell trait Brother    Breast cancer Neg Hx    Allergies  Allergen Reactions  Tape Rash    Patient Care Team: Gwyneth Sprout, FNP as PCP - General (Family Medicine)   Medications: Outpatient Medications Prior to Visit  Medication Sig   amLODipine (NORVASC) 10 MG tablet Take 1 tablet (10 mg total) by mouth daily.   losartan (COZAAR) 50 MG tablet Take 1 tablet (50 mg total) by mouth daily.   metoprolol tartrate (LOPRESSOR) 25 MG tablet TAKE 1 TABLET TWICE DAILY   Multiple Vitamin (MULTIVITAMIN) tablet Take 1 tablet by mouth daily.   valACYclovir (VALTREX) 500 MG tablet Take 1 tablet (500 mg total) by mouth daily.   No facility-administered medications prior to visit.    Review of Systems  {Labs  Heme  Chem   Endocrine  Serology  Results Review (optional):23779}  Objective    LMP 07/21/2019 (Exact Date)  {Show previous vital signs (optional):23777}   Physical Exam  ***  Last depression screening scores    10/02/2021    9:53 AM 07/02/2021    2:37 PM 01/18/2020    9:23 AM  PHQ 2/9 Scores  PHQ - 2 Score 0 0 0  PHQ- 9 Score 0 0 0   Last fall risk screening    10/02/2021    9:52 AM  Fall Risk   Falls in the past year? 0  Number falls in past yr: 0  Injury with Fall? 0  Risk for fall due to : No Fall Risks  Follow up Falls evaluation completed   Last Audit-C alcohol use screening    10/02/2021    9:52 AM  Alcohol Use Disorder Test (AUDIT)  1. How often do you have a drink containing alcohol? 3  2. How many drinks containing alcohol do you have on a typical day when you are drinking? 1  3. How often do you have six or more drinks on one occasion? 0  AUDIT-C Score 4  4. How often during the last year have you found that you were not able to stop drinking once you had started? 0  5. How often during the last year have you failed to do what was normally expected from you because of drinking? 0  6. How often during the last year have you needed a first drink in the morning to get yourself going after a heavy drinking session? 0  7. How often during the last year have you had a feeling of guilt of remorse after drinking? 0  8. How often during the last year have you been unable to remember what happened the night before because you had been drinking? 0  9. Have you or someone else been injured as a result of your drinking? 0  10. Has a relative or friend or a doctor or another health worker been concerned about your drinking or suggested you cut down? 0  Alcohol Use Disorder Identification Test Final Score (AUDIT) 4   A score of 3 or more in women, and 4 or more in men indicates increased risk for alcohol abuse, EXCEPT if all of the points are from question 1   No results found for  any visits on 07/06/22.  Assessment & Plan    Routine Health Maintenance and Physical Exam  Exercise Activities and Dietary recommendations  Goals   None     Immunization History  Administered Date(s) Administered   Tdap 05/26/2018   Zoster Recombinat (Shingrix) 07/02/2021    Health Maintenance  Topic Date Due   COVID-19 Vaccine (1) Never done   Zoster Vaccines-  Shingrix (2 of 2) 08/27/2021   MAMMOGRAM  03/11/2022   INFLUENZA VACCINE  10/13/2024 (Originally 07/20/2022)   COLONOSCOPY (Pts 45-77yr Insurance coverage will need to be confirmed)  07/17/2024   TETANUS/TDAP  05/26/2028   Hepatitis C Screening  Completed   HIV Screening  Completed   HPV VACCINES  Aged Out   PAP SMEAR-Modifier  Discontinued    Discussed health benefits of physical activity, and encouraged her to engage in regular exercise appropriate for her age and condition.  ***  No follow-ups on file.     {provider attestation***:1}   EGwyneth Sprout [phone](phone) [phone](fax)  [place]

## 2022-06-28 ENCOUNTER — Other Ambulatory Visit: Payer: Self-pay | Admitting: Family Medicine

## 2022-06-28 DIAGNOSIS — Z1231 Encounter for screening mammogram for malignant neoplasm of breast: Secondary | ICD-10-CM

## 2022-07-06 ENCOUNTER — Encounter: Payer: Managed Care, Other (non HMO) | Admitting: Family Medicine

## 2022-07-21 ENCOUNTER — Ambulatory Visit
Admission: RE | Admit: 2022-07-21 | Discharge: 2022-07-21 | Disposition: A | Discharge status: Home / Self Care | Payer: BC Managed Care – PPO | Source: Ambulatory Visit | Attending: Family Medicine | Admitting: Family Medicine

## 2022-07-21 ENCOUNTER — Encounter: Payer: Managed Care, Other (non HMO) | Admitting: Family Medicine

## 2022-07-21 DIAGNOSIS — Z1231 Encounter for screening mammogram for malignant neoplasm of breast: Secondary | ICD-10-CM | POA: Insufficient documentation

## 2022-07-22 NOTE — Progress Notes (Signed)
Hi Stephanie Jacobs  Normal mammogram; repeat in 1 year.  Please let us know if you have any questions.  Thank you,  Treniece Holsclaw, FNP 

## 2022-08-11 NOTE — Progress Notes (Signed)
Complete physical exam   Patient: Stephanie Jacobs   DOB: [birth year]   56 y.o. Female  MRN: [id number] Visit Date: 08/20/2022  Today's healthcare provider: Gwyneth Sprout, FNP  Introduced to nurse practitioner role and practice setting.  All questions answered.  Discussed provider/patient relationship and expectations.   I,Tiffany J Bragg,acting as a scribe for Gwyneth Sprout, FNP.,have documented all relevant documentation on the behalf of Gwyneth Sprout, FNP,as directed by  Gwyneth Sprout, FNP while in the presence of Gwyneth Sprout, FNP.   Chief Complaint  Patient presents with   Annual Exam   Subjective    Stephanie Jacobs is a 56 y.o. female who presents today for a complete physical exam.  She reports consuming a general diet. The patient does not participate in regular exercise at present. She generally feels well. She reports sleeping well. She does not have additional problems to discuss today.  HPI   Past Medical History:  Diagnosis Date   Hypertension    Sickle cell trait [facility])    Past Surgical History:  Procedure Laterality Date   ABDOMINAL HYSTERECTOMY     BREAST BIOPSY Right 05/02/2018   Affirm Bx- Fibrocystic breast tissue - coil clip   COLONOSCOPY N/A 07/17/2021   Procedure: COLONOSCOPY;  Surgeon: [name], MD;  Location: [facility] ENDOSCOPY;  Service: Gastroenterology;  Laterality: N/A;   COLONOSCOPY WITH PROPOFOL N/A 02/22/2020   Procedure: COLONOSCOPY WITH PROPOFOL;  Surgeon: [name], MD;  Location: [facility] ENDOSCOPY;  Service: Gastroenterology;  Laterality: N/A;   CYSTOSCOPY  08/07/2019   Procedure: CYSTOSCOPY;  Surgeon: Gae Dry, MD;  Location: [facility] ORS;  Service: Gynecology;;   DIAGNOSTIC LAPAROSCOPY     KNEE ARTHROSCOPY WITH MEDIAL MENISECTOMY Right 08/21/2020   Procedure: RIGHT KNEE ARTHROSCOPY WITH MEDIAL MENISECTOMY;  Surgeon: Thornton Park, MD;  Location: [facility] ORS;  Service: Orthopedics;  Laterality: Right;   TOTAL LAPAROSCOPIC HYSTERECTOMY WITH  BILATERAL SALPINGO OOPHORECTOMY N/A 08/07/2019   Procedure: TOTAL LAPAROSCOPIC HYSTERECTOMY WITH BILATERAL SALPINGO;  Surgeon: Gae Dry, MD;  Location: [facility] ORS;  Service: Gynecology;  Laterality: N/A;   TUBAL LIGATION  1995   Social History   Socioeconomic History   Marital status: Married    Spouse name: Not on file   Number of children: Not on file   Years of education: Not on file   Highest education level: Not on file  Occupational History   Not on file  Tobacco Use   Smoking status: Every Day    Packs/day: 0.50    Types: Cigarettes   Smokeless tobacco: Never  Vaping Use   Vaping Use: Never used  Substance and Sexual Activity   Alcohol use: Yes    Alcohol/week: 7.0 standard drinks of alcohol    Types: 7 Glasses of wine per week    Comment: One glass of red wine in the evenings.   Drug use: No   Sexual activity: Yes    Birth control/protection: Surgical    Comment: Hysterectomy  Other Topics Concern   Not on file  Social History Narrative   Not on file   Social Determinants of Health   Financial Resource Strain: Not on file  Food Insecurity: Not on file  Transportation Needs: Not on file  Physical Activity: Not on file  Stress: Not on file  Social Connections: Not on file  Intimate Partner Violence: Not on file   Family Status  Relation Name Status   Mother  Alive  Father  Alive   Brother  Deceased   Brother  (Not Specified)   Neg Hx  (Not Specified)   Family History  Problem Relation Age of Onset   Hypertension Mother    Diabetes Mother    Sickle cell anemia Brother    Sickle cell trait Brother    Breast cancer Neg Hx    Allergies  Allergen Reactions   Tape Rash    Patient Care Team: Gwyneth Sprout, FNP as PCP - General (Family Medicine)   Medications: Outpatient Medications Prior to Visit  Medication Sig   Multiple Vitamin (MULTIVITAMIN) tablet Take 1 tablet by mouth daily.   [DISCONTINUED] amLODipine (NORVASC) 10 MG tablet Take 1  tablet (10 mg total) by mouth daily.   [DISCONTINUED] losartan (COZAAR) 50 MG tablet Take 1 tablet (50 mg total) by mouth daily.   [DISCONTINUED] metoprolol tartrate (LOPRESSOR) 25 MG tablet TAKE 1 TABLET TWICE DAILY   [DISCONTINUED] valACYclovir (VALTREX) 500 MG tablet Take 1 tablet (500 mg total) by mouth daily.   No facility-administered medications prior to visit.    Review of Systems  Last CBC Lab Results  Component Value Date   WBC 7.6 08/19/2020   HGB 15.9 (H) 08/19/2020   HCT 44.6 08/19/2020   MCV 88.8 08/19/2020   MCH 31.7 08/19/2020   RDW 13.0 08/19/2020   PLT 317 16/60/6301   Last metabolic panel Lab Results  Component Value Date   GLUCOSE 101 (H) 08/19/2020   NA 138 08/19/2020   K 3.8 08/19/2020   CL 99 08/19/2020   CO2 26 08/19/2020   BUN 7 08/19/2020   CREATININE 0.51 08/19/2020   GFRNONAA >60 08/19/2020   CALCIUM 9.4 08/19/2020   PROT 8.1 01/18/2020   ALBUMIN 5.0 (H) 01/18/2020   LABGLOB 3.1 01/18/2020   AGRATIO 1.6 01/18/2020   BILITOT 0.8 01/18/2020   ALKPHOS 75 01/18/2020   AST 78 (H) 01/18/2020   ALT 68 (H) 01/18/2020   ANIONGAP 13 08/19/2020   Last lipids Lab Results  Component Value Date   CHOL 196 01/18/2020   HDL 67 01/18/2020   LDLCALC 114 (H) 01/18/2020   TRIG 85 01/18/2020   CHOLHDL 2.9 01/18/2020   Last hemoglobin A1c Lab Results  Component Value Date   HGBA1C 5.3 01/19/2018   Last thyroid functions Lab Results  Component Value Date   TSH 1.680 01/18/2020      Objective     BP (!) 150/94 (BP Location: Right Arm, Patient Position: Sitting, Cuff Size: Large)   Pulse 67   Temp 98.3 F (36.8 C) (Oral)   Resp 16   Ht '5\' 3"'$  (1.6 m)   Wt 204 lb (92.5 kg)   LMP 07/21/2019 (Exact Date)   SpO2 99%   BMI 36.14 kg/m   BP Readings from Last 3 Encounters:  08/20/22 (!) 150/94  10/02/21 (!) 168/114  07/20/21 140/90   Wt Readings from Last 3 Encounters:  08/20/22 204 lb (92.5 kg)  10/02/21 199 lb (90.3 kg)  07/20/21 196  lb (88.9 kg)   SpO2 Readings from Last 3 Encounters:  08/20/22 99%  10/02/21 96%  07/17/21 96%   Physical Exam Vitals and nursing note reviewed.  Constitutional:      General: She is awake. She is not in acute distress.    Appearance: Normal appearance. She is well-developed and well-groomed. She is obese. She is not ill-appearing, toxic-appearing or diaphoretic.  HENT:     Head: Normocephalic and atraumatic.  Jaw: There is normal jaw occlusion. No trismus, tenderness, swelling or pain on movement.     Right Ear: Hearing, tympanic membrane, ear canal and external ear normal. There is no impacted cerumen.     Left Ear: Hearing, tympanic membrane, ear canal and external ear normal. There is no impacted cerumen.     Nose: Nose normal. No congestion or rhinorrhea.     Right Turbinates: Not enlarged, swollen or pale.     Left Turbinates: Not enlarged, swollen or pale.     Right Sinus: No maxillary sinus tenderness or frontal sinus tenderness.     Left Sinus: No maxillary sinus tenderness or frontal sinus tenderness.     Mouth/Throat:     Lips: Pink.     Mouth: Mucous membranes are moist. No injury.     Tongue: No lesions.     Pharynx: Oropharynx is clear. Uvula midline. No pharyngeal swelling, oropharyngeal exudate, posterior oropharyngeal erythema or uvula swelling.     Tonsils: No tonsillar exudate or tonsillar abscesses.  Eyes:     General: Lids are normal. Lids are everted, no foreign bodies appreciated. Vision grossly intact. Gaze aligned appropriately. No allergic shiner or visual field deficit.       Right eye: No discharge.        Left eye: No discharge.     Extraocular Movements: Extraocular movements intact.     Conjunctiva/sclera: Conjunctivae normal.     Right eye: Right conjunctiva is not injected. No exudate.    Left eye: Left conjunctiva is not injected. No exudate.    Pupils: Pupils are equal, round, and reactive to light.  Neck:     Thyroid: No thyroid mass,  thyromegaly or thyroid tenderness.     Vascular: No carotid bruit.     Trachea: Trachea normal.  Cardiovascular:     Rate and Rhythm: Normal rate and regular rhythm.     Pulses: Normal pulses.          Carotid pulses are 2+ on the right side and 2+ on the left side.      Radial pulses are 2+ on the right side and 2+ on the left side.       Dorsalis pedis pulses are 2+ on the right side and 2+ on the left side.       Posterior tibial pulses are 2+ on the right side and 2+ on the left side.     Heart sounds: Normal heart sounds, S1 normal and S2 normal. No murmur heard.    No friction rub. No gallop.  Pulmonary:     Effort: Pulmonary effort is normal. No respiratory distress.     Breath sounds: Normal breath sounds and air entry. No stridor. No wheezing, rhonchi or rales.  Chest:     Chest wall: No tenderness.     Comments: Breasts: risk and benefit of breast self-exam was discussed, not examined, patient declines to have breast exam  Abdominal:     General: Abdomen is flat. Bowel sounds are normal. There is no distension.     Palpations: Abdomen is soft. There is no mass.     Tenderness: There is no abdominal tenderness. There is no right CVA tenderness, left CVA tenderness, guarding or rebound.     Hernia: No hernia is present.  Genitourinary:    Comments: Exam deferred; denies complaints Musculoskeletal:        General: No swelling, tenderness, deformity or signs of injury. Normal range of motion.  Cervical back: Full passive range of motion without pain, normal range of motion and neck supple. No edema, rigidity or tenderness. No muscular tenderness.     Right lower leg: No edema.     Left lower leg: No edema.  Lymphadenopathy:     Cervical: No cervical adenopathy.     Right cervical: No superficial, deep or posterior cervical adenopathy.    Left cervical: No superficial, deep or posterior cervical adenopathy.  Skin:    General: Skin is warm and dry.     Capillary Refill:  Capillary refill takes less than 2 seconds.     Coloration: Skin is not jaundiced or pale.     Findings: No bruising, erythema, lesion or rash.  Neurological:     General: No focal deficit present.     Mental Status: She is alert and oriented to person, place, and time. Mental status is at baseline.     GCS: GCS eye subscore is 4. GCS verbal subscore is 5. GCS motor subscore is 6.     Sensory: Sensation is intact. No sensory deficit.     Motor: Motor function is intact. No weakness.     Coordination: Coordination is intact. Coordination normal.     Gait: Gait is intact. Gait normal.  Psychiatric:        Attention and Perception: Attention and perception normal.        Mood and Affect: Mood and affect normal.        Speech: Speech normal.        Behavior: Behavior normal. Behavior is cooperative.        Thought Content: Thought content normal.        Cognition and Memory: Cognition and memory normal.        Judgment: Judgment normal.      Last depression screening scores    08/20/2022   10:06 AM 10/02/2021    9:53 AM 07/02/2021    2:37 PM  PHQ 2/9 Scores  PHQ - 2 Score 0 0 0  PHQ- 9 Score 0 0 0   Last fall risk screening    08/20/2022   10:06 AM  [place] in the past year? 0  Number falls in past yr: 0  Injury with Fall? 0  Risk for fall due to : No Fall Risks  Follow up Falls evaluation completed   Last Audit-C alcohol use screening    08/20/2022   11:50 AM  Alcohol Use Disorder Test (AUDIT)  1. How often do you have a drink containing alcohol? 4  2. How many drinks containing alcohol do you have on a typical day when you are drinking? 1  3. How often do you have six or more drinks on one occasion? 4  AUDIT-C Score 9  4. How often during the last year have you found that you were not able to stop drinking once you had started? 0  5. How often during the last year have you failed to do what was normally expected from you because of drinking? 0  6. How often  during the last year have you needed a first drink in the morning to get yourself going after a heavy drinking session? 0  7. How often during the last year have you had a feeling of guilt of remorse after drinking? 0  8. How often during the last year have you been unable to remember what happened the night before because you had been drinking? 0  9. Have you or someone else been injured as a result of your drinking? 0  10. Has a relative or friend or a doctor or another health worker been concerned about your drinking or suggested you cut down? 0  Alcohol Use Disorder Identification Test Final Score (AUDIT) 9   A score of 3 or more in women, and 4 or more in men indicates increased risk for alcohol abuse, EXCEPT if all of the points are from question 1   No results found for any visits on 08/20/22.  Assessment & Plan    Routine Health Maintenance and Physical Exam  Exercise Activities and Dietary recommendations  Goals   None     Immunization History  Administered Date(s) Administered   Tdap 05/26/2018   Zoster Recombinat (Shingrix) 07/02/2021    Health Maintenance  Topic Date Due   COVID-19 Vaccine (1) Never done   Zoster Vaccines- Shingrix (2 of 2) 08/27/2021   INFLUENZA VACCINE  10/13/2024 (Originally 07/20/2022)   COLONOSCOPY (Pts 45-76yr Insurance coverage will need to be confirmed)  07/17/2024   MAMMOGRAM  07/21/2024   TETANUS/TDAP  05/26/2028   Hepatitis C Screening  Completed   HIV Screening  Completed   HPV VACCINES  Aged Out   PAP SMEAR-Modifier  Discontinued    Discussed health benefits of physical activity, and encouraged her to engage in regular exercise appropriate for her age and condition.  Problem List Items Addressed This Visit       Cardiovascular and Mediastinum   Essential (primary) hypertension    Chronic, improved; however, not at goal of <140/<90 Denies CP Denies SOB/ DOE Denies low blood pressure/hypotension Denies vision changes No LE  Edema noted on exam Continue medication, Norvasc 10 mg, Metop 25 BID, change losartan 50 to losartan 100 with 25 HCTZ;  Denies side effects RTC in 1 month or follow up with BP from home checks to seek improvement to goal Seek emergent care if you develop chest pain or chest pressure       Relevant Medications   amLODipine (NORVASC) 10 MG tablet   metoprolol tartrate (LOPRESSOR) 25 MG tablet   losartan-hydrochlorothiazide (HYZAAR) 100-25 MG tablet   Other Relevant Orders   Comprehensive Metabolic Panel (CMET)   CBC with Differential/Platelet   Lipid panel     Other   Alcoholism syndrome (HCC)    Chronic, stable reports 1-2 glasses/wine per day      Annual physical exam - Primary    UTD on vision and dental Things to do to keep yourself healthy  - Exercise at least 30-45 minutes a day, 3-4 days a week.  - Eat a low-fat diet with lots of fruits and vegetables, up to 7-9 servings per day.  - Seatbelts can save your life. Wear them always.  - Smoke detectors on every level of your home, check batteries every year.  - Eye Doctor - have an eye exam every 1-2 years  - Safe sex - if you may be exposed to STDs, use a condom.  - Alcohol -  If you drink, do it moderately, less than 2 drinks per day.  - [place] Choose someone to speak for you if you are not able.  - Depression is common in our stressful world.If you're feeling down or losing interest in things you normally enjoy, please come in for a visit.  - Violence - If anyone is threatening or hurting you, please call immediately.  Relevant Orders   Comprehensive Metabolic Panel (CMET)   CBC with Differential/Platelet   TSH + free T4   Lipid panel   Hemoglobin A1c   Blood glucose elevated    Chronic, stable Repeat CMP add A1c Continue to recommend balanced, lower carb meals. Smaller meal size, adding snacks. Choosing water as drink of choice and increasing purposeful exercise.       Relevant  Orders   Hemoglobin A1c   Compulsive tobacco user syndrome    Chronic, stable Remains pre contemplative regarding quitting at this time       HSV-2 seropositive    Acute on chronic episode Request for PRN treatment course  Denies current symptoms  Discussed use of medication with prodromal       Relevant Medications   valACYclovir (VALTREX) 1000 MG tablet   Morbid obesity (HCC)    Chronic, stable HTN, HLD, Sickle Cell Body mass index is 36.14 kg/m. Discussed importance of healthy weight management Discussed diet and exercise       Sickle cell trait (HCC)    Chronic, stable Reports occasional pain with dehydration         Return in about 4 weeks (around 09/17/2022) for HTN management, nurse follow up.     Vonna Kotyk, FNP, have reviewed all documentation for this visit. The documentation on 08/20/22 for the exam, diagnosis, procedures, and orders are all accurate and complete.    Gwyneth Sprout, [place] [phone] (phone) [phone] (fax)  [place]

## 2022-08-20 ENCOUNTER — Ambulatory Visit (INDEPENDENT_AMBULATORY_CARE_PROVIDER_SITE_OTHER): Payer: BC Managed Care – PPO | Admitting: Family Medicine

## 2022-08-20 ENCOUNTER — Encounter: Payer: Self-pay | Admitting: Family Medicine

## 2022-08-20 VITALS — BP 150/94 | HR 67 | Temp 98.3°F | Resp 16 | Ht 63.0 in | Wt 204.0 lb

## 2022-08-20 DIAGNOSIS — Z0000 Encounter for general adult medical examination without abnormal findings: Secondary | ICD-10-CM | POA: Diagnosis not present

## 2022-08-20 DIAGNOSIS — I10 Essential (primary) hypertension: Secondary | ICD-10-CM

## 2022-08-20 DIAGNOSIS — F17200 Nicotine dependence, unspecified, uncomplicated: Secondary | ICD-10-CM

## 2022-08-20 DIAGNOSIS — F1020 Alcohol dependence, uncomplicated: Secondary | ICD-10-CM

## 2022-08-20 DIAGNOSIS — R739 Hyperglycemia, unspecified: Secondary | ICD-10-CM

## 2022-08-20 DIAGNOSIS — R7689 Other specified abnormal immunological findings in serum: Secondary | ICD-10-CM | POA: Insufficient documentation

## 2022-08-20 DIAGNOSIS — D573 Sickle-cell trait: Secondary | ICD-10-CM

## 2022-08-20 DIAGNOSIS — R768 Other specified abnormal immunological findings in serum: Secondary | ICD-10-CM

## 2022-08-20 DIAGNOSIS — F1090 Alcohol use, unspecified, uncomplicated: Secondary | ICD-10-CM | POA: Insufficient documentation

## 2022-08-20 MED ORDER — AMLODIPINE BESYLATE 10 MG PO TABS: 10.0000 mg | ORAL_TABLET | Freq: Every day | ORAL | 3 refills | Status: DC

## 2022-08-20 MED ORDER — METOPROLOL TARTRATE 25 MG PO TABS: ORAL_TABLET | ORAL | 3 refills | Status: DC

## 2022-08-20 MED ORDER — VALACYCLOVIR HCL 1 G PO TABS: 1000.0000 mg | ORAL_TABLET | Freq: Two times a day (BID) | ORAL | 11 refills | Status: AC

## 2022-08-20 MED ORDER — LOSARTAN POTASSIUM-HCTZ 100-25 MG PO TABS: 1.0000 | ORAL_TABLET | Freq: Every day | ORAL | 3 refills | Status: DC

## 2022-08-20 NOTE — Assessment & Plan Note (Signed)
Chronic, stable Repeat CMP add A1c Continue to recommend balanced, lower carb meals. Smaller meal size, adding snacks. Choosing water as drink of choice and increasing purposeful exercise.

## 2022-08-20 NOTE — Assessment & Plan Note (Signed)
Chronic, stable Reports occasional pain with dehydration

## 2022-08-20 NOTE — Assessment & Plan Note (Signed)
Acute on chronic episode Request for PRN treatment course  Denies current symptoms  Discussed use of medication with prodromal

## 2022-08-20 NOTE — Assessment & Plan Note (Signed)
UTD on vision and dental Things to do to keep yourself healthy  - Exercise at least 30-45 minutes a day, 3-4 days a week.  - Eat a low-fat diet with lots of fruits and vegetables, up to 7-9 servings per day.  - Seatbelts can save your life. Wear them always.  - Smoke detectors on every level of your home, check batteries every year.  - Eye Doctor - have an eye exam every 1-2 years  - Safe sex - if you may be exposed to STDs, use a condom.  - Alcohol -  If you drink, do it moderately, less than 2 drinks per day.  - Health Care Power of Attorney. Choose someone to speak for you if you are not able.  - Depression is common in our stressful world.If you're feeling down or losing interest in things you normally enjoy, please come in for a visit.  - Violence - If anyone is threatening or hurting you, please call immediately.  

## 2022-08-20 NOTE — Assessment & Plan Note (Signed)
Chronic, stable reports 1-2 glasses/wine per day

## 2022-08-20 NOTE — Assessment & Plan Note (Signed)
Chronic, improved; however, not at goal of <140/<90 Denies CP Denies SOB/ DOE Denies low blood pressure/hypotension Denies vision changes No LE Edema noted on exam Continue medication, Norvasc 10 mg, Metop 25 BID, change losartan 50 to losartan 100 with 25 HCTZ;  Denies side effects RTC in 1 month or follow up with BP from home checks to seek improvement to goal Seek emergent care if you develop chest pain or chest pressure

## 2022-08-20 NOTE — Assessment & Plan Note (Signed)
Chronic, stable Remains pre contemplative regarding quitting at this time

## 2022-08-20 NOTE — Assessment & Plan Note (Signed)
Chronic, stable HTN, HLD, Sickle Cell Body mass index is 36.14 kg/m. Discussed importance of healthy weight management Discussed diet and exercise

## 2022-08-21 LAB — HEMOGLOBIN A1C
Est. average glucose Bld gHb Est-mCnc: 114 mg/dL
Hgb A1c MFr Bld: 5.6 % (ref 4.8–5.6)

## 2022-08-21 LAB — CBC WITH DIFFERENTIAL/PLATELET
Basophils Absolute: 0 10*3/uL (ref 0.0–0.2)
Basos: 0 %
EOS (ABSOLUTE): 0.2 10*3/uL (ref 0.0–0.4)
Eos: 2 %
Hematocrit: 46.4 % (ref 34.0–46.6)
Hemoglobin: 16.5 g/dL — ABNORMAL HIGH (ref 11.1–15.9)
Immature Grans (Abs): 0 10*3/uL (ref 0.0–0.1)
Immature Granulocytes: 0 %
Lymphocytes Absolute: 2.8 10*3/uL (ref 0.7–3.1)
Lymphs: 35 %
MCH: 31 pg (ref 26.6–33.0)
MCHC: 35.6 g/dL (ref 31.5–35.7)
MCV: 87 fL (ref 79–97)
Monocytes Absolute: 0.6 10*3/uL (ref 0.1–0.9)
Monocytes: 8 %
Neutrophils Absolute: 4.4 10*3/uL (ref 1.4–7.0)
Neutrophils: 55 %
Platelets: 300 10*3/uL (ref 150–450)
RBC: 5.32 x10E6/uL — ABNORMAL HIGH (ref 3.77–5.28)
RDW: 12.5 % (ref 11.7–15.4)
WBC: 8 10*3/uL (ref 3.4–10.8)

## 2022-08-21 LAB — COMPREHENSIVE METABOLIC PANEL
ALT: 80 IU/L — ABNORMAL HIGH (ref 0–32)
AST: 107 IU/L — ABNORMAL HIGH (ref 0–40)
Albumin/Globulin Ratio: 1.6 (ref 1.2–2.2)
Albumin: 4.7 g/dL (ref 3.8–4.9)
Alkaline Phosphatase: 87 IU/L (ref 44–121)
BUN/Creatinine Ratio: 12 (ref 9–23)
BUN: 7 mg/dL (ref 6–24)
Bilirubin Total: 0.8 mg/dL (ref 0.0–1.2)
CO2: 20 mmol/L (ref 20–29)
Calcium: 9.5 mg/dL (ref 8.7–10.2)
Chloride: 101 mmol/L (ref 96–106)
Creatinine, Ser: 0.57 mg/dL (ref 0.57–1.00)
Globulin, Total: 3 g/dL (ref 1.5–4.5)
Glucose: 90 mg/dL (ref 70–99)
Potassium: 4.2 mmol/L (ref 3.5–5.2)
Sodium: 140 mmol/L (ref 134–144)
Total Protein: 7.7 g/dL (ref 6.0–8.5)
eGFR: 109 mL/min/{1.73_m2} (ref >59)

## 2022-08-21 LAB — LIPID PANEL
Chol/HDL Ratio: 4 ratio (ref 0.0–4.4)
Cholesterol, Total: 204 mg/dL — ABNORMAL HIGH (ref 100–199)
HDL: 51 mg/dL (ref >39)
LDL Chol Calc (NIH): 128 mg/dL — ABNORMAL HIGH (ref 0–99)
Triglycerides: 139 mg/dL (ref 0–149)
VLDL Cholesterol Cal: 25 mg/dL (ref 5–40)

## 2022-08-21 LAB — TSH+FREE T4
Free T4: 1.1 ng/dL (ref 0.82–1.77)
TSH: 1.59 u[IU]/mL (ref 0.450–4.500)

## 2022-08-21 NOTE — Progress Notes (Signed)
Blood chemistry shows increased liver enzymes; both AST and ALT. Recommend referral to GI for liver scanning, please let us know if you would like this referral.  Blood counts shows slight elevation in red blood cells and hemoglobin; this could also lead to liver concerns. Recommend referral to hematology for further evaluation.  Cholesterol panel is slightly increased. I recommend diet low in saturated fat and regular exercise - 30 min at least 5 times per week  The 10-year ASCVD risk score (Arnett DK, et al., 2019) is: 16.2%   Values used to calculate the score:     Age: 56 years     Sex: Female     Is Non-Hispanic African American: Yes     Diabetic: No     Tobacco smoker: Yes     Systolic Blood Pressure: 389 mmHg     Is BP treated: Yes     HDL Cholesterol: 51 mg/dL     Total Cholesterol: 204 mg/dL Heart attack and stroke risk is 16% estimated within the next 10 years which is high. We can discuss starting cholesterol medication at your blood pressure follow up appt if desire. Recommend starting Crestor 20 mg. Please let us know if you would like to start on this medication.   Normal thyroid.  Borderline A1c at 5.6% Continue to recommend balanced, lower carb meals. Smaller meal size, adding snacks. Choosing water as drink of choice and increasing purposeful exercise.  Please let us know if you have any questions.  Thank you, Gwyneth Sprout, [id number] [place], [place] [address] [phone] (phone) [phone] (fax) [place]

## 2022-08-25 ENCOUNTER — Other Ambulatory Visit: Payer: Self-pay | Admitting: Family Medicine

## 2022-08-25 DIAGNOSIS — E7800 Pure hypercholesterolemia, unspecified: Secondary | ICD-10-CM

## 2022-08-25 DIAGNOSIS — D582 Other hemoglobinopathies: Secondary | ICD-10-CM

## 2022-08-25 DIAGNOSIS — R748 Abnormal levels of other serum enzymes: Secondary | ICD-10-CM

## 2022-08-25 MED ORDER — ROSUVASTATIN CALCIUM 20 MG PO TABS: 20.0000 mg | ORAL_TABLET | Freq: Every day | ORAL | 3 refills | Status: DC

## 2022-08-27 DIAGNOSIS — R748 Abnormal levels of other serum enzymes: Secondary | ICD-10-CM

## 2022-08-27 HISTORY — DX: Abnormal levels of other serum enzymes: R74.8

## 2022-08-30 ENCOUNTER — Encounter: Payer: Self-pay | Admitting: Family Medicine

## 2022-09-01 ENCOUNTER — Other Ambulatory Visit: Payer: Self-pay | Admitting: Family Medicine

## 2022-09-01 MED ORDER — LACTULOSE 10 GM/15ML PO SOLN: 10.0000 g | Freq: Every day | ORAL | 1 refills | Status: AC | PRN

## 2022-09-03 ENCOUNTER — Inpatient Hospital Stay: Payer: BC Managed Care – PPO

## 2022-09-03 ENCOUNTER — Encounter: Payer: Self-pay | Admitting: Oncology

## 2022-09-03 ENCOUNTER — Inpatient Hospital Stay: Payer: BC Managed Care – PPO | Attending: Oncology | Admitting: Oncology

## 2022-09-03 VITALS — BP 143/99 | HR 70 | Temp 98.7°F | Resp 17 | Wt 203.3 lb

## 2022-09-03 DIAGNOSIS — D751 Secondary polycythemia: Secondary | ICD-10-CM | POA: Insufficient documentation

## 2022-09-03 DIAGNOSIS — F1090 Alcohol use, unspecified, uncomplicated: Secondary | ICD-10-CM | POA: Diagnosis not present

## 2022-09-03 DIAGNOSIS — F17210 Nicotine dependence, cigarettes, uncomplicated: Secondary | ICD-10-CM | POA: Diagnosis not present

## 2022-09-03 DIAGNOSIS — Z720 Tobacco use: Secondary | ICD-10-CM | POA: Insufficient documentation

## 2022-09-03 LAB — CBC WITH DIFFERENTIAL/PLATELET
Abs Immature Granulocytes: 0.04 10*3/uL (ref 0.00–0.07)
Basophils Absolute: 0 10*3/uL (ref 0.0–0.1)
Basophils Relative: 0 %
Eosinophils Absolute: 0.1 10*3/uL (ref 0.0–0.5)
Eosinophils Relative: 1 %
HCT: 47 % — ABNORMAL HIGH (ref 36.0–46.0)
Hemoglobin: 16.5 g/dL — ABNORMAL HIGH (ref 12.0–15.0)
Immature Granulocytes: 0 %
Lymphocytes Relative: 33 %
Lymphs Abs: 3 10*3/uL (ref 0.7–4.0)
MCH: 30.6 pg (ref 26.0–34.0)
MCHC: 35.1 g/dL (ref 30.0–36.0)
MCV: 87 fL (ref 80.0–100.0)
Monocytes Absolute: 0.6 10*3/uL (ref 0.1–1.0)
Monocytes Relative: 7 %
Neutro Abs: 5.4 10*3/uL (ref 1.7–7.7)
Neutrophils Relative %: 59 %
Platelets: 325 10*3/uL (ref 150–400)
RBC: 5.4 MIL/uL — ABNORMAL HIGH (ref 3.87–5.11)
RDW: 12.9 % (ref 11.5–15.5)
WBC: 9.2 10*3/uL (ref 4.0–10.5)
nRBC: 0 % (ref 0.0–0.2)

## 2022-09-03 LAB — COMPREHENSIVE METABOLIC PANEL
ALT: 74 U/L — ABNORMAL HIGH (ref 0–44)
AST: 82 U/L — ABNORMAL HIGH (ref 15–41)
Albumin: 4.7 g/dL (ref 3.5–5.0)
Alkaline Phosphatase: 73 U/L (ref 38–126)
Anion gap: 9 (ref 5–15)
BUN: 11 mg/dL (ref 6–20)
CO2: 28 mmol/L (ref 22–32)
Calcium: 9.4 mg/dL (ref 8.9–10.3)
Chloride: 100 mmol/L (ref 98–111)
Creatinine, Ser: 0.49 mg/dL (ref 0.44–1.00)
GFR, Estimated: >60 mL/min (ref >60)
Glucose, Bld: 101 mg/dL — ABNORMAL HIGH (ref 70–99)
Potassium: 3.6 mmol/L (ref 3.5–5.1)
Sodium: 137 mmol/L (ref 135–145)
Total Bilirubin: 0.7 mg/dL (ref 0.3–1.2)
Total Protein: 8.4 g/dL — ABNORMAL HIGH (ref 6.5–8.1)

## 2022-09-03 NOTE — Progress Notes (Signed)
Patient here for initial oncology appointment, expresses no complaints or concerns at this time.   

## 2022-09-03 NOTE — Assessment & Plan Note (Signed)
Labs are reviewed and discussed with patient. Erythrocytosis is an abnormal elevation of hemoglobin (Hgb) and/or hematocrit (Hct) in peripheral blood, and this can be caused by primary etiology, ie myeloproliferative disease, or secondary etiology, ie sleep apnea, smoking, etc  or familiar condition.  I will obtain erythropoietin, carbo monoxide level, rule out primary etiology, JAK2 with reflex to other mutations, BCR-ABL1 FISH.

## 2022-09-03 NOTE — Progress Notes (Signed)
Hematology/Oncology Consult note [phone] [phone]      Patient Care Team: Gwyneth Sprout, FNP as PCP - General (Family Medicine)  REFERRING PROVIDER: Gwyneth Sprout, FNP   CHIEF COMPLAINTS/REASON FOR VISIT:  Evaluation of erythrocytosis  HISTORY OF PRESENTING ILLNESS:   Stephanie Jacobs is a  56 y.o.  female with PMH listed below was seen in consultation at the request of  Gwyneth Sprout, FNP  for evaluation of /erythrocytosis/elevated hemoglobin,   Patient has abnormal CBC with hemoglobin of 16.5 on 08/20/22.  Reviewed patient's previous labs.  elevated hemoglobin is chronic onset, dated back to 2021  Patient denies unintentional weight loss, fever, chills, fatigue, night sweats.  Patient is currently every day smoker Denies previous VTE history. She has a history of sleep apnea, not using CPAP machine.    MEDICAL HISTORY:  Past Medical History:  Diagnosis Date   Elevated liver enzymes 08/27/2022   Hypertension    Sickle cell trait ([place])     SURGICAL HISTORY: Past Surgical History:  Procedure Laterality Date   ABDOMINAL HYSTERECTOMY     BREAST BIOPSY Right 05/02/2018   Affirm Bx- Fibrocystic breast tissue - coil clip   COLONOSCOPY N/A 07/17/2021   Procedure: COLONOSCOPY;  Surgeon: [name], MD;  Location: [facility] ENDOSCOPY;  Service: Gastroenterology;  Laterality: N/A;   COLONOSCOPY WITH PROPOFOL N/A 02/22/2020   Procedure: COLONOSCOPY WITH PROPOFOL;  Surgeon: [name], MD;  Location: [facility] ENDOSCOPY;  Service: Gastroenterology;  Laterality: N/A;   CYSTOSCOPY  08/07/2019   Procedure: CYSTOSCOPY;  Surgeon: Gae Dry, MD;  Location: [facility] ORS;  Service: Gynecology;;   DIAGNOSTIC LAPAROSCOPY     KNEE ARTHROSCOPY WITH MEDIAL MENISECTOMY Right 08/21/2020   Procedure: RIGHT KNEE ARTHROSCOPY WITH MEDIAL MENISECTOMY;  Surgeon: Thornton Park, MD;  Location: [facility] ORS;  Service: Orthopedics;  Laterality: Right;   TOTAL LAPAROSCOPIC HYSTERECTOMY  WITH BILATERAL SALPINGO OOPHORECTOMY N/A 08/07/2019   Procedure: TOTAL LAPAROSCOPIC HYSTERECTOMY WITH BILATERAL SALPINGO;  Surgeon: Gae Dry, MD;  Location: [facility] ORS;  Service: Gynecology;  Laterality: N/A;   TUBAL LIGATION  1995    SOCIAL HISTORY: Social History   Socioeconomic History   Marital status: Married    Spouse name: Not on file   Number of children: Not on file   Years of education: Not on file   Highest education level: Not on file  Occupational History   Not on file  Tobacco Use   Smoking status: Every Day    Packs/day: 1.00    Types: Cigarettes   Smokeless tobacco: Never  Vaping Use   Vaping Use: Never used  Substance and Sexual Activity   Alcohol use: Yes    Alcohol/week: 7.0 standard drinks of alcohol    Types: 7 Glasses of wine per week    Comment: One glass of red wine in the evenings.   Drug use: No   Sexual activity: Yes    Birth control/protection: Surgical    Comment: Hysterectomy  Other Topics Concern   Not on file  Social History Narrative   Not on file   Social Determinants of Health   Financial Resource Strain: Not on file  Food Insecurity: Not on file  Transportation Needs: Not on file  Physical Activity: Not on file  Stress: Not on file  Social Connections: Not on file  Intimate Partner Violence: Not on file    FAMILY HISTORY: Family History  Problem Relation Age of Onset   Hypertension Mother  Diabetes Mother    Sickle cell anemia Brother    Sickle cell trait Brother    Breast cancer Neg Hx     ALLERGIES:  is allergic to tape.  MEDICATIONS:  Current Outpatient Medications  Medication Sig Dispense Refill   amLODipine (NORVASC) 10 MG tablet Take 1 tablet (10 mg total) by mouth daily. 90 tablet 3   losartan-hydrochlorothiazide (HYZAAR) 100-25 MG tablet Take 1 tablet by mouth daily. 90 tablet 3   metoprolol tartrate (LOPRESSOR) 25 MG tablet TAKE 1 TABLET TWICE DAILY 180 tablet 3   Multiple Vitamin (MULTIVITAMIN)  tablet Take 1 tablet by mouth daily.     rosuvastatin (CRESTOR) 20 MG tablet Take 1 tablet (20 mg total) by mouth daily. 90 tablet 3   valACYclovir (VALTREX) 1000 MG tablet Take 1 tablet (1,000 mg total) by mouth 2 (two) times daily. Use one tablet, twice daily for flares; 3-7 days typically course 20 tablet 11   lactulose (CHRONULAC) 10 GM/15ML solution Take 15 mLs (10 g total) by mouth daily as needed for mild constipation. (Patient not taking: Reported on 09/03/2022) 236 mL 1   No current facility-administered medications for this visit.     Review of Systems  Constitutional:  Negative for appetite change, chills, fatigue and fever.  HENT:   Negative for hearing loss and voice change.   Eyes:  Negative for eye problems.  Respiratory:  Negative for chest tightness and cough.   Cardiovascular:  Negative for chest pain.  Gastrointestinal:  Negative for abdominal distention, abdominal pain and blood in stool.  Endocrine: Negative for hot flashes.  Genitourinary:  Negative for difficulty urinating and frequency.   Musculoskeletal:  Negative for arthralgias.  Skin:  Negative for itching and rash.  Neurological:  Negative for extremity weakness.  Hematological:  Negative for adenopathy.  Psychiatric/Behavioral:  Negative for confusion.     PHYSICAL EXAMINATION: ECOG PERFORMANCE STATUS: 0 - Asymptomatic Vitals:   09/03/22 1123  BP: (!) 143/99  Pulse: 70  Resp: 17  Temp: 98.7 F (37.1 C)  SpO2: 100%   Filed Weights   09/03/22 1123  Weight: 203 lb 4.8 oz (92.2 kg)    Physical Exam Constitutional:      General: She is not in acute distress.    Appearance: She is obese.  HENT:     Head: Normocephalic and atraumatic.  Eyes:     General: No scleral icterus. Cardiovascular:     Rate and Rhythm: Normal rate and regular rhythm.     Heart sounds: Normal heart sounds.  Pulmonary:     Effort: Pulmonary effort is normal. No respiratory distress.     Breath sounds: No wheezing.   Abdominal:     General: There is no distension.     Palpations: Abdomen is soft.  Musculoskeletal:        General: Normal range of motion.     Cervical back: Normal range of motion and neck supple.  Skin:    General: Skin is warm and dry.  Neurological:     Mental Status: She is alert and oriented to person, place, and time. Mental status is at baseline.     Cranial Nerves: No cranial nerve deficit.  Psychiatric:        Mood and Affect: Mood normal.     LABORATORY DATA:  I have reviewed the data as listed    Latest Ref Rng & Units 09/03/2022   12:39 PM 08/20/2022   10:36 AM 08/19/2020    9:12  AM  CBC  WBC 4.0 - 10.5 K/uL 9.2  8.0  7.6   Hemoglobin 12.0 - 15.0 g/dL 16.5  16.5  15.9   Hematocrit 36.0 - 46.0 % 47.0  46.4  44.6   Platelets 150 - 400 K/uL 325  300  317       Latest Ref Rng & Units 09/03/2022   12:39 PM 08/20/2022   10:36 AM 08/19/2020    9:12 AM  CMP  Glucose 70 - 99 mg/dL 101  90  101   BUN 6 - 20 mg/dL '11  7  7   '$ Creatinine 0.44 - 1.00 mg/dL 0.49  0.57  0.51   Sodium 135 - 145 mmol/L 137  140  138   Potassium 3.5 - 5.1 mmol/L 3.6  4.2  3.8   Chloride 98 - 111 mmol/L 100  101  99   CO2 22 - 32 mmol/L '28  20  26   '$ Calcium 8.9 - 10.3 mg/dL 9.4  9.5  9.4   Total Protein 6.5 - 8.1 g/dL 8.4  7.7    Total Bilirubin 0.3 - 1.2 mg/dL 0.7  0.8    Alkaline Phos 38 - 126 U/L 73  87    AST 15 - 41 U/L 82  107    ALT 0 - 44 U/L 74  80       RADIOGRAPHIC STUDIES: I have personally reviewed the radiological images as listed and agreed with the findings in the report. MM 3D SCREEN BREAST BILATERAL  Result Date: 07/22/2022 CLINICAL DATA:  Screening. EXAM: DIGITAL SCREENING BILATERAL MAMMOGRAM WITH TOMOSYNTHESIS AND CAD TECHNIQUE: Bilateral screening digital craniocaudal and mediolateral oblique mammograms were obtained. Bilateral screening digital breast tomosynthesis was performed. The images were evaluated with computer-aided detection. COMPARISON:  Previous exam(s). ACR  Breast Density Category b: There are scattered areas of fibroglandular density. FINDINGS: There are no findings suspicious for malignancy. IMPRESSION: No mammographic evidence of malignancy. A result letter of this screening mammogram will be mailed directly to the patient. RECOMMENDATION: Screening mammogram in one year. (Code:SM-B-01Y) BI-RADS CATEGORY  1: Negative. Electronically Signed   By: Margarette Canada M.D.   On: 07/22/2022 12:43     ASSESSMENT & PLAN:   Erythrocytosis Labs are reviewed and discussed with patient. Erythrocytosis is an abnormal elevation of hemoglobin (Hgb) and/or hematocrit (Hct) in peripheral blood, and this can be caused by primary etiology, ie myeloproliferative disease, or secondary etiology, ie sleep apnea, smoking, etc  or familiar condition.  I will obtain erythropoietin, carbo monoxide level, rule out primary etiology, JAK2 with reflex to other mutations, BCR-ABL1 FISH.     Tobacco use Smoke cessation discussed with patient     Orders Placed This Encounter  Procedures   CBC with Differential/Platelet    Standing Status:   Future    Number of Occurrences:   1    Standing Expiration Date:   09/04/2023   Comprehensive metabolic panel    Standing Status:   Future    Number of Occurrences:   1    Standing Expiration Date:   09/04/2023   BCR-ABL1 FISH    Standing Status:   Future    Number of Occurrences:   1    Standing Expiration Date:   09/04/2023   Carbon monoxide, blood (performed at ref lab)    Standing Status:   Future    Number of Occurrences:   1    Standing Expiration Date:   09/04/2023   Erythropoietin  Standing Status:   Future    Number of Occurrences:   1    Standing Expiration Date:   09/04/2023   JAK2 V617F rfx CALR/MPL/E12-15    Standing Status:   Future    Number of Occurrences:   1    Standing Expiration Date:   09/04/2023    All questions were answered. The patient knows to call the clinic with any problems questions or  concerns.  Return of visit: 3-4 weeks to discuss results.  Gwyneth Sprout, FNP Thank you for this kind referral and the opportunity to participate in the care of this patient. A copy of today's note is routed to referring provider  Total face to face encounter time for this patient visit was 16mn. >50% of the time was  spent in counseling and coordination of care.    ZEarlie Server MD, PhD Hematology Oncology 09/03/2022

## 2022-09-03 NOTE — Assessment & Plan Note (Signed)
Smoke cessation discussed with patient

## 2022-09-04 LAB — ERYTHROPOIETIN: Erythropoietin: 4.2 m[IU]/mL (ref 2.6–18.5)

## 2022-09-07 LAB — CARBON MONOXIDE, BLOOD (PERFORMED AT REF LAB): Carbon Monoxide, Blood: 6.6 % — ABNORMAL HIGH (ref 0.0–3.6)

## 2022-09-09 LAB — BCR-ABL1 FISH
Cells Analyzed: 200
Cells Counted: 200

## 2022-09-10 LAB — JAK2 V617F RFX CALR/MPL/E12-15

## 2022-09-10 LAB — CALR +MPL + E12-E15  (REFLEX)

## 2022-10-01 ENCOUNTER — Inpatient Hospital Stay: Payer: BC Managed Care – PPO | Attending: Oncology | Admitting: Oncology

## 2022-10-01 ENCOUNTER — Encounter: Payer: Self-pay | Admitting: Oncology

## 2022-10-01 VITALS — BP 131/89 | HR 67 | Temp 96.7°F | Resp 18 | Wt 203.6 lb

## 2022-10-01 DIAGNOSIS — D573 Sickle-cell trait: Secondary | ICD-10-CM | POA: Insufficient documentation

## 2022-10-01 DIAGNOSIS — F17210 Nicotine dependence, cigarettes, uncomplicated: Secondary | ICD-10-CM | POA: Insufficient documentation

## 2022-10-01 DIAGNOSIS — D751 Secondary polycythemia: Secondary | ICD-10-CM | POA: Diagnosis not present

## 2022-10-01 DIAGNOSIS — Z720 Tobacco use: Secondary | ICD-10-CM

## 2022-10-01 DIAGNOSIS — I10 Essential (primary) hypertension: Secondary | ICD-10-CM | POA: Insufficient documentation

## 2022-10-01 NOTE — Assessment & Plan Note (Signed)
Labs are reviewed and discussed with patient. JAK2 V617F mutation negative, with reflex to other mutations CALR, MPL, JAK 2 Ex 12-15 mutations negative. Negative BCR-ABL.  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  Hct <52, no need for phlebotomy.

## 2022-10-01 NOTE — Progress Notes (Signed)
Hematology/Oncology Consult note [phone] [phone]      Patient Care Team: Gwyneth Sprout, FNP as PCP - General (Family Medicine)  ASSESSMENT & PLAN:   Secondary erythrocytosis Labs are reviewed and discussed with patient. JAK2 V617F mutation negative, with reflex to other mutations CALR, MPL, JAK 2 Ex 12-15 mutations negative. Negative BCR-ABL.  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  Hct <52, no need for phlebotomy.   Tobacco use Smoke cessation discussed with patient   Orders Placed This Encounter  Procedures   CBC with Differential/Platelet    Standing Status:   Future    Standing Expiration Date:   10/02/2023   Ambulatory Referral for Lung Cancer Screening    Referral Priority:   Routine    Referral Type:   Consultation    Referral Reason:   Specialty Services Required    Number of Visits Requested:   1   Follow up in 4 months.  All questions were answered. The patient knows to call the clinic with any problems, questions or concerns.  Earlie Server, MD, PhD [facility] Hematology Oncology 10/01/2022    CHIEF COMPLAINTS/REASON FOR VISIT:  Secondary erythrocytosis  HISTORY OF PRESENTING ILLNESS:   Stephanie Jacobs is a  56 y.o.  female with PMH listed below was seen in consultation at the request of  Gwyneth Sprout, FNP  for evaluation of /erythrocytosis/elevated hemoglobin,   Patient has abnormal CBC with hemoglobin of 16.5 on 08/20/22.  Reviewed patient's previous labs.  elevated hemoglobin is chronic onset, dated back to 2021  Patient denies unintentional weight loss, fever, chills, fatigue, night sweats.  Patient is currently every day smoker Denies previous VTE history. She has a history of sleep apnea, not using CPAP machine.   INTERVAL HISTORY Stephanie Jacobs is a 56 y.o. female who has above history reviewed by me today presents for follow up visit for secondary erythrocytosis.  She presents  to discuss results. No new complains.    MEDICAL HISTORY:  Past Medical History:  Diagnosis Date   Elevated liver enzymes 08/27/2022   Hypertension    Sickle cell trait ([place])     SURGICAL HISTORY: Past Surgical History:  Procedure Laterality Date   ABDOMINAL HYSTERECTOMY     BREAST BIOPSY Right 05/02/2018   Affirm Bx- Fibrocystic breast tissue - coil clip   COLONOSCOPY N/A 07/17/2021   Procedure: COLONOSCOPY;  Surgeon: [name], MD;  Location: [facility] ENDOSCOPY;  Service: Gastroenterology;  Laterality: N/A;   COLONOSCOPY WITH PROPOFOL N/A 02/22/2020   Procedure: COLONOSCOPY WITH PROPOFOL;  Surgeon: [name], MD;  Location: [facility] ENDOSCOPY;  Service: Gastroenterology;  Laterality: N/A;   CYSTOSCOPY  08/07/2019   Procedure: CYSTOSCOPY;  Surgeon: Gae Dry, MD;  Location: [facility] ORS;  Service: Gynecology;;   DIAGNOSTIC LAPAROSCOPY     KNEE ARTHROSCOPY WITH MEDIAL MENISECTOMY Right 08/21/2020   Procedure: RIGHT KNEE ARTHROSCOPY WITH MEDIAL MENISECTOMY;  Surgeon: Thornton Park, MD;  Location: [facility] ORS;  Service: Orthopedics;  Laterality: Right;   TOTAL LAPAROSCOPIC HYSTERECTOMY WITH BILATERAL SALPINGO OOPHORECTOMY N/A 08/07/2019   Procedure: TOTAL LAPAROSCOPIC HYSTERECTOMY WITH BILATERAL SALPINGO;  Surgeon: Gae Dry, MD;  Location: [facility] ORS;  Service: Gynecology;  Laterality: N/A;   TUBAL LIGATION  1995    SOCIAL HISTORY: Social History   Socioeconomic History   Marital status: Married    Spouse name: Not on file   Number of children: Not on file   Years of education: Not on file  Highest education level: Not on file  Occupational History   Not on file  Tobacco Use   Smoking status: Every Day    Packs/day: 1.00    Years: 35.00    Total pack years: 35.00    Types: Cigarettes   Smokeless tobacco: Never  Vaping Use   Vaping Use: Never used  Substance and Sexual Activity   Alcohol use: Yes    Alcohol/week: 7.0 standard drinks of alcohol    Types: 7 Glasses of  wine per week    Comment: One glass of red wine in the evenings.   Drug use: No   Sexual activity: Yes    Birth control/protection: Surgical    Comment: Hysterectomy  Other Topics Concern   Not on file  Social History Narrative   Not on file   Social Determinants of Health   Financial Resource Strain: Not on file  Food Insecurity: Not on file  Transportation Needs: Not on file  Physical Activity: Not on file  Stress: Not on file  Social Connections: Not on file  Intimate Partner Violence: Not on file    FAMILY HISTORY: Family History  Problem Relation Age of Onset   Hypertension Mother    Diabetes Mother    Sickle cell anemia Brother    Sickle cell trait Brother    Breast cancer Neg Hx     ALLERGIES:  is allergic to tape.  MEDICATIONS:  Current Outpatient Medications  Medication Sig Dispense Refill   amLODipine (NORVASC) 10 MG tablet Take 1 tablet (10 mg total) by mouth daily. 90 tablet 3   lactulose (CHRONULAC) 10 GM/15ML solution Take 15 mLs (10 g total) by mouth daily as needed for mild constipation. 236 mL 1   losartan-hydrochlorothiazide (HYZAAR) 100-25 MG tablet Take 1 tablet by mouth daily. 90 tablet 3   metoprolol tartrate (LOPRESSOR) 25 MG tablet TAKE 1 TABLET TWICE DAILY 180 tablet 3   Multiple Vitamin (MULTIVITAMIN) tablet Take 1 tablet by mouth daily.     rosuvastatin (CRESTOR) 20 MG tablet Take 1 tablet (20 mg total) by mouth daily. 90 tablet 3   valACYclovir (VALTREX) 1000 MG tablet Take 1 tablet (1,000 mg total) by mouth 2 (two) times daily. Use one tablet, twice daily for flares; 3-7 days typically course 20 tablet 11   No current facility-administered medications for this visit.     Review of Systems  Constitutional:  Negative for appetite change, chills, fatigue and fever.  HENT:   Negative for hearing loss and voice change.   Eyes:  Negative for eye problems.  Respiratory:  Negative for chest tightness and cough.   Cardiovascular:  Negative for  chest pain.  Gastrointestinal:  Negative for abdominal distention, abdominal pain and blood in stool.  Endocrine: Negative for hot flashes.  Genitourinary:  Negative for difficulty urinating and frequency.   Musculoskeletal:  Negative for arthralgias.  Skin:  Negative for itching and rash.  Neurological:  Negative for extremity weakness.  Hematological:  Negative for adenopathy.  Psychiatric/Behavioral:  Negative for confusion.     PHYSICAL EXAMINATION: ECOG PERFORMANCE STATUS: 0 - Asymptomatic Vitals:   10/01/22 1152  BP: 131/89  Pulse: 67  Resp: 18  Temp: (!) 96.7 F (35.9 C)   Filed Weights   10/01/22 1152  Weight: 203 lb 9.6 oz (92.4 kg)    Physical Exam Constitutional:      General: She is not in acute distress.    Appearance: She is obese.  HENT:  Head: Normocephalic and atraumatic.  Eyes:     General: No scleral icterus. Cardiovascular:     Rate and Rhythm: Normal rate and regular rhythm.     Heart sounds: Normal heart sounds.  Pulmonary:     Effort: Pulmonary effort is normal. No respiratory distress.     Breath sounds: No wheezing.  Abdominal:     General: There is no distension.     Palpations: Abdomen is soft.  Musculoskeletal:        General: Normal range of motion.     Cervical back: Normal range of motion and neck supple.  Skin:    General: Skin is warm and dry.  Neurological:     Mental Status: She is alert and oriented to person, place, and time. Mental status is at baseline.     Cranial Nerves: No cranial nerve deficit.  Psychiatric:        Mood and Affect: Mood normal.     LABORATORY DATA:  I have reviewed the data as listed    Latest Ref Rng & Units 09/03/2022   12:39 PM 08/20/2022   10:36 AM 08/19/2020    9:12 AM  CBC  WBC 4.0 - 10.5 K/uL 9.2  8.0  7.6   Hemoglobin 12.0 - 15.0 g/dL 16.5  16.5  15.9   Hematocrit 36.0 - 46.0 % 47.0  46.4  44.6   Platelets 150 - 400 K/uL 325  300  317       Latest Ref Rng & Units 09/03/2022   12:39  PM 08/20/2022   10:36 AM 08/19/2020    9:12 AM  CMP  Glucose 70 - 99 mg/dL 101  90  101   BUN 6 - 20 mg/dL '11  7  7   '$ Creatinine 0.44 - 1.00 mg/dL 0.49  0.57  0.51   Sodium 135 - 145 mmol/L 137  140  138   Potassium 3.5 - 5.1 mmol/L 3.6  4.2  3.8   Chloride 98 - 111 mmol/L 100  101  99   CO2 22 - 32 mmol/L '28  20  26   '$ Calcium 8.9 - 10.3 mg/dL 9.4  9.5  9.4   Total Protein 6.5 - 8.1 g/dL 8.4  7.7    Total Bilirubin 0.3 - 1.2 mg/dL 0.7  0.8    Alkaline Phos 38 - 126 U/L 73  87    AST 15 - 41 U/L 82  107    ALT 0 - 44 U/L 74  80       RADIOGRAPHIC STUDIES: I have personally reviewed the radiological images as listed and agreed with the findings in the report. MM 3D SCREEN BREAST BILATERAL  Result Date: 07/22/2022 CLINICAL DATA:  Screening. EXAM: DIGITAL SCREENING BILATERAL MAMMOGRAM WITH TOMOSYNTHESIS AND CAD TECHNIQUE: Bilateral screening digital craniocaudal and mediolateral oblique mammograms were obtained. Bilateral screening digital breast tomosynthesis was performed. The images were evaluated with computer-aided detection. COMPARISON:  Previous exam(s). ACR Breast Density Category b: There are scattered areas of fibroglandular density. FINDINGS: There are no findings suspicious for malignancy. IMPRESSION: No mammographic evidence of malignancy. A result letter of this screening mammogram will be mailed directly to the patient. RECOMMENDATION: Screening mammogram in one year. (Code:SM-B-01Y) BI-RADS CATEGORY  1: Negative. Electronically Signed   By: Margarette Canada M.D.   On: 07/22/2022 12:43

## 2022-10-01 NOTE — Assessment & Plan Note (Signed)
Smoke cessation discussed with patient

## 2023-01-10 ENCOUNTER — Telehealth: Payer: Self-pay

## 2023-01-10 NOTE — Telephone Encounter (Signed)
Patient is schedule with Dr. Marius Ditch n 01/17/2023 but Dr. Vicente Males has done her colonoscopy in the past so Dr. Vicente Males needs to be the one to see her in the office. Please reschedule to Dr. Vicente Males schedule

## 2023-01-11 ENCOUNTER — Ambulatory Visit: Payer: BC Managed Care – PPO | Admitting: Gastroenterology

## 2023-01-17 ENCOUNTER — Ambulatory Visit: Payer: BC Managed Care – PPO | Admitting: Gastroenterology

## 2023-02-04 ENCOUNTER — Inpatient Hospital Stay: Payer: BC Managed Care – PPO | Admitting: Oncology

## 2023-02-04 ENCOUNTER — Inpatient Hospital Stay: Payer: BC Managed Care – PPO

## 2023-02-18 DIAGNOSIS — U071 COVID-19: Secondary | ICD-10-CM | POA: Diagnosis not present

## 2023-02-18 DIAGNOSIS — Z20822 Contact with and (suspected) exposure to covid-19: Secondary | ICD-10-CM | POA: Diagnosis not present

## 2023-02-21 ENCOUNTER — Ambulatory Visit: Payer: BC Managed Care – PPO | Admitting: Gastroenterology

## 2023-02-21 ENCOUNTER — Other Ambulatory Visit: Payer: Self-pay

## 2023-03-17 ENCOUNTER — Encounter: Payer: Self-pay | Admitting: Oncology

## 2023-03-17 ENCOUNTER — Inpatient Hospital Stay (HOSPITAL_BASED_OUTPATIENT_CLINIC_OR_DEPARTMENT_OTHER): Payer: BC Managed Care – PPO | Admitting: Oncology

## 2023-03-17 ENCOUNTER — Inpatient Hospital Stay: Payer: BC Managed Care – PPO | Attending: Oncology

## 2023-03-17 ENCOUNTER — Inpatient Hospital Stay: Payer: BC Managed Care – PPO

## 2023-03-17 VITALS — BP 143/97 | HR 75 | Temp 97.2°F | Resp 18

## 2023-03-17 DIAGNOSIS — D751 Secondary polycythemia: Secondary | ICD-10-CM

## 2023-03-17 DIAGNOSIS — Z720 Tobacco use: Secondary | ICD-10-CM | POA: Diagnosis not present

## 2023-03-17 DIAGNOSIS — D573 Sickle-cell trait: Secondary | ICD-10-CM | POA: Diagnosis not present

## 2023-03-17 LAB — CBC WITH DIFFERENTIAL/PLATELET
Abs Immature Granulocytes: 0.03 10*3/uL (ref 0.00–0.07)
Basophils Absolute: 0 10*3/uL (ref 0.0–0.1)
Basophils Relative: 0 %
Eosinophils Absolute: 0.3 10*3/uL (ref 0.0–0.5)
Eosinophils Relative: 3 %
HCT: 42.2 % (ref 36.0–46.0)
Hemoglobin: 15.1 g/dL — ABNORMAL HIGH (ref 12.0–15.0)
Immature Granulocytes: 0 %
Lymphocytes Relative: 32 %
Lymphs Abs: 3.4 10*3/uL (ref 0.7–4.0)
MCH: 30.4 pg (ref 26.0–34.0)
MCHC: 35.8 g/dL (ref 30.0–36.0)
MCV: 85.1 fL (ref 80.0–100.0)
Monocytes Absolute: 0.9 10*3/uL (ref 0.1–1.0)
Monocytes Relative: 8 %
Neutro Abs: 5.9 10*3/uL (ref 1.7–7.7)
Neutrophils Relative %: 57 %
Platelets: 263 10*3/uL (ref 150–400)
RBC: 4.96 MIL/uL (ref 3.87–5.11)
RDW: 13 % (ref 11.5–15.5)
WBC: 10.6 10*3/uL — ABNORMAL HIGH (ref 4.0–10.5)
nRBC: 0 % (ref 0.0–0.2)

## 2023-03-17 NOTE — Assessment & Plan Note (Addendum)
Encourage her smoke cessation effort  Refer to lung cancer screening program

## 2023-03-17 NOTE — Progress Notes (Signed)
Phlebotomy held today.

## 2023-03-17 NOTE — Progress Notes (Signed)
Hematology/Oncology Consult note [phone] [phone]      CHIEF COMPLAINTS/REASON FOR VISIT:  Secondary erythrocytosis  ASSESSMENT & PLAN:   Secondary erythrocytosis Labs are reviewed and discussed with patient. JAK2 V617F mutation negative, with reflex to other mutations CALR, MPL, JAK 2 Ex 12-15 mutations negative. Negative BCR-ABL.  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  Hct <52, no need for phlebotomy.  Hb has improved.   Tobacco use Encourage her smoke cessation effort  Refer to lung cancer screening program   Orders Placed This Encounter  Procedures   CBC with Differential ([place] Only)    Standing Status:   Future    Standing Expiration Date:   03/16/2024   CMP ([place] only)    Standing Status:   Future    Standing Expiration Date:   03/16/2024   Ambulatory Referral for Lung Cancer Scre    Referral Priority:   Routine    Referral Type:   Consultation    Referral Reason:   Specialty Services Required    Number of Visits Requested:   1   Follow up in 12 months.  All questions were answered. The patient knows to call the clinic with any problems, questions or concerns.  Earlie Server, MD, PhD [facility] Hematology Oncology 03/17/2023      HISTORY OF PRESENTING ILLNESS:   Stephanie Jacobs is a  57 y.o.  female with PMH listed below was seen in consultation at the request of  Gwyneth Sprout, FNP  for evaluation of /erythrocytosis/elevated hemoglobin,   Patient has abnormal CBC with hemoglobin of 16.5 on 08/20/22.  Reviewed patient's previous labs.  elevated hemoglobin is chronic onset, dated back to 2021  Patient denies unintentional weight loss, fever, chills, fatigue, night sweats.  Patient is currently every day smoker Denies previous VTE history. She has a history of sleep apnea, not using CPAP machine.   INTERVAL HISTORY Stephanie Jacobs is a 57 y.o. female who has above history  reviewed by me today presents for follow up visit for secondary erythrocytosis.  She presents to discuss results. No new complains.  Previously smokes 1PPD, now 0.5ppd      MEDICAL HISTORY:  Past Medical History:  Diagnosis Date   Elevated liver enzymes 08/27/2022   Hypertension    Sickle cell trait ([place])     SURGICAL HISTORY: Past Surgical History:  Procedure Laterality Date   ABDOMINAL HYSTERECTOMY     BREAST BIOPSY Right 05/02/2018   Affirm Bx- Fibrocystic breast tissue - coil clip   COLONOSCOPY N/A 07/17/2021   Procedure: COLONOSCOPY;  Surgeon: [name], MD;  Location: [facility] ENDOSCOPY;  Service: Gastroenterology;  Laterality: N/A;   COLONOSCOPY WITH PROPOFOL N/A 02/22/2020   Procedure: COLONOSCOPY WITH PROPOFOL;  Surgeon: [name], MD;  Location: [facility] ENDOSCOPY;  Service: Gastroenterology;  Laterality: N/A;   CYSTOSCOPY  08/07/2019   Procedure: CYSTOSCOPY;  Surgeon: Gae Dry, MD;  Location: [facility] ORS;  Service: Gynecology;;   DIAGNOSTIC LAPAROSCOPY     KNEE ARTHROSCOPY WITH MEDIAL MENISECTOMY Right 08/21/2020   Procedure: RIGHT KNEE ARTHROSCOPY WITH MEDIAL MENISECTOMY;  Surgeon: Thornton Park, MD;  Location: [facility] ORS;  Service: Orthopedics;  Laterality: Right;   TOTAL LAPAROSCOPIC HYSTERECTOMY WITH BILATERAL SALPINGO OOPHORECTOMY N/A 08/07/2019   Procedure: TOTAL LAPAROSCOPIC HYSTERECTOMY WITH BILATERAL SALPINGO;  Surgeon: Gae Dry, MD;  Location: [facility] ORS;  Service: Gynecology;  Laterality: N/A;   TUBAL LIGATION  1995    SOCIAL HISTORY: Social History  Socioeconomic History   Marital status: Married    Spouse name: Not on file   Number of children: Not on file   Years of education: Not on file   Highest education level: Not on file  Occupational History   Not on file  Tobacco Use   Smoking status: Every Day    Packs/day: 1.00    Years: 35.00    Additional pack years: 0.00    Total pack years: 35.00    Types: Cigarettes   Smokeless tobacco:  Never  Vaping Use   Vaping Use: Never used  Substance and Sexual Activity   Alcohol use: Yes    Alcohol/week: 7.0 standard drinks of alcohol    Types: 7 Glasses of wine per week    Comment: One glass of red wine in the evenings.   Drug use: No   Sexual activity: Yes    Birth control/protection: Surgical    Comment: Hysterectomy  Other Topics Concern   Not on file  Social History Narrative   Not on file   Social Determinants of Health   Financial Resource Strain: Not on file  Food Insecurity: Not on file  Transportation Needs: Not on file  Physical Activity: Not on file  Stress: Not on file  Social Connections: Not on file  Intimate Partner Violence: Not on file    FAMILY HISTORY: Family History  Problem Relation Age of Onset   Hypertension Mother    Diabetes Mother    Sickle cell anemia Brother    Sickle cell trait Brother    Breast cancer Neg Hx     ALLERGIES:  is allergic to tape.  MEDICATIONS:  Current Outpatient Medications  Medication Sig Dispense Refill   amLODipine (NORVASC) 10 MG tablet Take 1 tablet (10 mg total) by mouth daily. 90 tablet 3   lactulose (CHRONULAC) 10 GM/15ML solution Take 15 mLs (10 g total) by mouth daily as needed for mild constipation. 236 mL 1   losartan-hydrochlorothiazide (HYZAAR) 100-25 MG tablet Take 1 tablet by mouth daily. 90 tablet 3   metoprolol tartrate (LOPRESSOR) 25 MG tablet TAKE 1 TABLET TWICE DAILY 180 tablet 3   Multiple Vitamin (MULTIVITAMIN) tablet Take 1 tablet by mouth daily.     PAXLOVID, 300/100, 20 x 150 MG & 10 x 100MG  TBPK Take by mouth 2 (two) times daily.     promethazine-dextromethorphan (PROMETHAZINE-DM) 6.25-15 MG/5ML syrup Take 5 mLs by mouth every 4 (four) hours as needed.     rosuvastatin (CRESTOR) 20 MG tablet Take 1 tablet (20 mg total) by mouth daily. 90 tablet 3   valACYclovir (VALTREX) 1000 MG tablet Take 1 tablet (1,000 mg total) by mouth 2 (two) times daily. Use one tablet, twice daily for flares;  3-7 days typically course 20 tablet 11   No current facility-administered medications for this visit.     Review of Systems  Constitutional:  Negative for appetite change, chills, fatigue and fever.  HENT:   Negative for hearing loss and voice change.   Eyes:  Negative for eye problems.  Respiratory:  Negative for chest tightness and cough.   Cardiovascular:  Negative for chest pain.  Gastrointestinal:  Negative for abdominal distention, abdominal pain and blood in stool.  Endocrine: Negative for hot flashes.  Genitourinary:  Negative for difficulty urinating and frequency.   Musculoskeletal:  Negative for arthralgias.  Skin:  Negative for itching and rash.  Neurological:  Negative for extremity weakness.  Hematological:  Negative for adenopathy.  Psychiatric/Behavioral:  Negative for confusion.     PHYSICAL EXAMINATION: ECOG PERFORMANCE STATUS: 0 - Asymptomatic Vitals:   03/17/23 1059  BP: (!) 143/97  Pulse: 75  Resp: 18  Temp: (!) 97.2 F (36.2 C)  SpO2: 100%   There were no vitals filed for this visit.   Physical Exam Constitutional:      General: She is not in acute distress.    Appearance: She is obese.  HENT:     Head: Normocephalic and atraumatic.  Eyes:     General: No scleral icterus. Cardiovascular:     Rate and Rhythm: Normal rate and regular rhythm.     Heart sounds: Normal heart sounds.  Pulmonary:     Effort: Pulmonary effort is normal. No respiratory distress.     Breath sounds: No wheezing.  Abdominal:     General: There is no distension.     Palpations: Abdomen is soft.  Musculoskeletal:        General: Normal range of motion.     Cervical back: Normal range of motion and neck supple.  Skin:    General: Skin is warm and dry.  Neurological:     Mental Status: She is alert and oriented to person, place, and time. Mental status is at baseline.     Cranial Nerves: No cranial nerve deficit.  Psychiatric:        Mood and Affect: Mood normal.      LABORATORY DATA:  I have reviewed the data as listed    Latest Ref Rng & Units 03/17/2023   10:36 AM 09/03/2022   12:39 PM 08/20/2022   10:36 AM  CBC  WBC 4.0 - 10.5 K/uL 10.6  9.2  8.0   Hemoglobin 12.0 - 15.0 g/dL 15.1  16.5  16.5   Hematocrit 36.0 - 46.0 % 42.2  47.0  46.4   Platelets 150 - 400 K/uL 263  325  300       Latest Ref Rng & Units 09/03/2022   12:39 PM 08/20/2022   10:36 AM 08/19/2020    9:12 AM  CMP  Glucose 70 - 99 mg/dL 101  90  101   BUN 6 - 20 mg/dL 11  7  7    Creatinine 0.44 - 1.00 mg/dL 0.49  0.57  0.51   Sodium 135 - 145 mmol/L 137  140  138   Potassium 3.5 - 5.1 mmol/L 3.6  4.2  3.8   Chloride 98 - 111 mmol/L 100  101  99   CO2 22 - 32 mmol/L 28  20  26    Calcium 8.9 - 10.3 mg/dL 9.4  9.5  9.4   Total Protein 6.5 - 8.1 g/dL 8.4  7.7    Total Bilirubin 0.3 - 1.2 mg/dL 0.7  0.8    Alkaline Phos 38 - 126 U/L 73  87    AST 15 - 41 U/L 82  107    ALT 0 - 44 U/L 74  80       RADIOGRAPHIC STUDIES: I have personally reviewed the radiological images as listed and agreed with the findings in the report. No results found.

## 2023-03-17 NOTE — Assessment & Plan Note (Addendum)
Labs are reviewed and discussed with patient. JAK2 V617F mutation negative, with reflex to other mutations CALR, MPL, JAK 2 Ex 12-15 mutations negative. Negative BCR-ABL.  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  Hct <52, no need for phlebotomy.  Hb has improved.

## 2023-03-30 ENCOUNTER — Inpatient Hospital Stay: Payer: BC Managed Care – PPO | Attending: Oncology | Admitting: Occupational Therapy

## 2023-04-12 ENCOUNTER — Ambulatory Visit: Payer: BC Managed Care – PPO | Admitting: Gastroenterology

## 2023-05-06 ENCOUNTER — Encounter: Payer: Self-pay | Admitting: *Deleted

## 2023-12-15 ENCOUNTER — Emergency Department: Payer: BC Managed Care – PPO

## 2023-12-15 ENCOUNTER — Emergency Department (HOSPITAL_COMMUNITY): Payer: BC Managed Care – PPO

## 2023-12-15 ENCOUNTER — Other Ambulatory Visit: Payer: Self-pay

## 2023-12-15 DIAGNOSIS — F1029 Alcohol dependence with unspecified alcohol-induced disorder: Secondary | ICD-10-CM | POA: Diagnosis present

## 2023-12-15 DIAGNOSIS — R9389 Abnormal findings on diagnostic imaging of other specified body structures: Secondary | ICD-10-CM | POA: Diagnosis not present

## 2023-12-15 DIAGNOSIS — Z8249 Family history of ischemic heart disease and other diseases of the circulatory system: Secondary | ICD-10-CM | POA: Diagnosis not present

## 2023-12-15 DIAGNOSIS — Z91048 Other nonmedicinal substance allergy status: Secondary | ICD-10-CM | POA: Diagnosis not present

## 2023-12-15 DIAGNOSIS — E669 Obesity, unspecified: Secondary | ICD-10-CM | POA: Diagnosis not present

## 2023-12-15 DIAGNOSIS — Z6837 Body mass index (BMI) 37.0-37.9, adult: Secondary | ICD-10-CM

## 2023-12-15 DIAGNOSIS — Z79899 Other long term (current) drug therapy: Secondary | ICD-10-CM

## 2023-12-15 DIAGNOSIS — R0789 Other chest pain: Secondary | ICD-10-CM | POA: Diagnosis not present

## 2023-12-15 DIAGNOSIS — R109 Unspecified abdominal pain: Secondary | ICD-10-CM | POA: Diagnosis not present

## 2023-12-15 DIAGNOSIS — R079 Chest pain, unspecified: Secondary | ICD-10-CM | POA: Diagnosis not present

## 2023-12-15 DIAGNOSIS — I10 Essential (primary) hypertension: Secondary | ICD-10-CM | POA: Diagnosis present

## 2023-12-15 DIAGNOSIS — K760 Fatty (change of) liver, not elsewhere classified: Secondary | ICD-10-CM | POA: Diagnosis not present

## 2023-12-15 DIAGNOSIS — I160 Hypertensive urgency: Secondary | ICD-10-CM | POA: Diagnosis not present

## 2023-12-15 DIAGNOSIS — K2980 Duodenitis without bleeding: Secondary | ICD-10-CM | POA: Diagnosis not present

## 2023-12-15 DIAGNOSIS — Z9851 Tubal ligation status: Secondary | ICD-10-CM | POA: Diagnosis not present

## 2023-12-15 DIAGNOSIS — Z90710 Acquired absence of both cervix and uterus: Secondary | ICD-10-CM | POA: Diagnosis not present

## 2023-12-15 DIAGNOSIS — E6609 Other obesity due to excess calories: Secondary | ICD-10-CM | POA: Diagnosis not present

## 2023-12-15 DIAGNOSIS — R933 Abnormal findings on diagnostic imaging of other parts of digestive tract: Secondary | ICD-10-CM | POA: Diagnosis not present

## 2023-12-15 DIAGNOSIS — F1090 Alcohol use, unspecified, uncomplicated: Secondary | ICD-10-CM | POA: Diagnosis not present

## 2023-12-15 DIAGNOSIS — Z832 Family history of diseases of the blood and blood-forming organs and certain disorders involving the immune mechanism: Secondary | ICD-10-CM

## 2023-12-15 DIAGNOSIS — D573 Sickle-cell trait: Secondary | ICD-10-CM | POA: Diagnosis not present

## 2023-12-15 DIAGNOSIS — K2970 Gastritis, unspecified, without bleeding: Secondary | ICD-10-CM | POA: Diagnosis present

## 2023-12-15 DIAGNOSIS — E66812 Obesity, class 2: Secondary | ICD-10-CM | POA: Diagnosis not present

## 2023-12-15 DIAGNOSIS — K852 Alcohol induced acute pancreatitis without necrosis or infection: Secondary | ICD-10-CM | POA: Diagnosis not present

## 2023-12-15 DIAGNOSIS — F17210 Nicotine dependence, cigarettes, uncomplicated: Secondary | ICD-10-CM | POA: Diagnosis present

## 2023-12-15 DIAGNOSIS — R935 Abnormal findings on diagnostic imaging of other abdominal regions, including retroperitoneum: Secondary | ICD-10-CM | POA: Diagnosis not present

## 2023-12-15 LAB — CBC
HCT: 46.4 % — ABNORMAL HIGH (ref 36.0–46.0)
Hemoglobin: 16.5 g/dL — ABNORMAL HIGH (ref 12.0–15.0)
MCH: 30.6 pg (ref 26.0–34.0)
MCHC: 35.6 g/dL (ref 30.0–36.0)
MCV: 86.1 fL (ref 80.0–100.0)
Platelets: 295 10*3/uL (ref 150–400)
RBC: 5.39 MIL/uL — ABNORMAL HIGH (ref 3.87–5.11)
RDW: 13.1 % (ref 11.5–15.5)
WBC: 11.7 10*3/uL — ABNORMAL HIGH (ref 4.0–10.5)
nRBC: 0 % (ref 0.0–0.2)

## 2023-12-15 LAB — TROPONIN I (HIGH SENSITIVITY)
Troponin I (High Sensitivity): 7 ng/L (ref <18)
Troponin I (High Sensitivity): 7 ng/L (ref <18)

## 2023-12-15 LAB — BASIC METABOLIC PANEL
Anion gap: 16 — ABNORMAL HIGH (ref 5–15)
BUN: 8 mg/dL (ref 6–20)
CO2: 19 mmol/L — ABNORMAL LOW (ref 22–32)
Calcium: 9 mg/dL (ref 8.9–10.3)
Chloride: 99 mmol/L (ref 98–111)
Creatinine, Ser: 0.49 mg/dL (ref 0.44–1.00)
GFR, Estimated: >60 mL/min (ref >60)
Glucose, Bld: 146 mg/dL — ABNORMAL HIGH (ref 70–99)
Potassium: 3.6 mmol/L (ref 3.5–5.1)
Sodium: 134 mmol/L — ABNORMAL LOW (ref 135–145)

## 2023-12-15 LAB — HEPATIC FUNCTION PANEL
ALT: 67 U/L — ABNORMAL HIGH (ref 0–44)
AST: 67 U/L — ABNORMAL HIGH (ref 15–41)
Albumin: 4.5 g/dL (ref 3.5–5.0)
Alkaline Phosphatase: 70 U/L (ref 38–126)
Bilirubin, Direct: 0.2 mg/dL (ref 0.0–0.2)
Indirect Bilirubin: 1 mg/dL — ABNORMAL HIGH (ref 0.3–0.9)
Total Bilirubin: 1.2 mg/dL — ABNORMAL HIGH (ref <1.2)
Total Protein: 8.1 g/dL (ref 6.5–8.1)

## 2023-12-15 LAB — LIPASE, BLOOD: Lipase: 1150 U/L — ABNORMAL HIGH (ref 11–51)

## 2023-12-15 MED ORDER — ONDANSETRON 4 MG PO TBDP
4.0000 mg | ORAL_TABLET | Freq: Once | ORAL | Status: AC
  Administered 2023-12-15: 4 mg via ORAL
  Filled 2023-12-15: qty 1

## 2023-12-15 NOTE — ED Triage Notes (Signed)
Pt to ED for abd pain, emesis started this am. Reports chest pain started while driving here.  Reports thinks has diverticulitis flare. Reports seeing blood in emesis.  Has not taken anything for HTN today.  Daily drinker, last drink yesterday evening.

## 2023-12-15 NOTE — ED Provider Triage Note (Signed)
Emergency Medicine Provider Triage Evaluation Note  Stephanie Jacobs , a 57 y.o. female  was evaluated in triage.  Pt complains of abdominal pain with nausea, vomiting, and diarrhea that started about 6am. Reports history of diverticulitis, but has never had a "flare up." Also states that while enroute to the hospital she developed chest pain.[name]   Physical Exam  LMP 07/21/2019 (Exact Date)  Gen:   Awake, no distress   Resp:  Normal effort  MSK:   Moves extremities without difficulty  Other:  Diffuse abdominal pain; abdomen is soft and not distended.  Medical Decision Making  Medically screening exam initiated at 4:13 PM.  Appropriate orders placed.  [name] was informed that the remainder of the evaluation will be completed by another provider, this initial triage assessment does not replace that evaluation, and the importance of remaining in the ED until their evaluation is complete.  Protocols started.   [name], FNP 12/15/23 1615

## 2023-12-16 ENCOUNTER — Inpatient Hospital Stay
Admission: EM | Admit: 2023-12-16 | Discharge: 2023-12-18 | DRG: 439 | Disposition: A | Discharge status: Home / Self Care | Payer: BC Managed Care – PPO | Attending: Internal Medicine | Admitting: Internal Medicine

## 2023-12-16 ENCOUNTER — Emergency Department: Payer: BC Managed Care – PPO

## 2023-12-16 DIAGNOSIS — Z90710 Acquired absence of both cervix and uterus: Secondary | ICD-10-CM | POA: Diagnosis not present

## 2023-12-16 DIAGNOSIS — I160 Hypertensive urgency: Secondary | ICD-10-CM | POA: Diagnosis present

## 2023-12-16 DIAGNOSIS — I10 Essential (primary) hypertension: Secondary | ICD-10-CM | POA: Diagnosis present

## 2023-12-16 DIAGNOSIS — F1029 Alcohol dependence with unspecified alcohol-induced disorder: Secondary | ICD-10-CM

## 2023-12-16 DIAGNOSIS — Z91048 Other nonmedicinal substance allergy status: Secondary | ICD-10-CM | POA: Diagnosis not present

## 2023-12-16 DIAGNOSIS — F17210 Nicotine dependence, cigarettes, uncomplicated: Secondary | ICD-10-CM | POA: Diagnosis present

## 2023-12-16 DIAGNOSIS — K852 Alcohol induced acute pancreatitis without necrosis or infection: Principal | ICD-10-CM | POA: Diagnosis present

## 2023-12-16 DIAGNOSIS — F1090 Alcohol use, unspecified, uncomplicated: Secondary | ICD-10-CM | POA: Diagnosis present

## 2023-12-16 DIAGNOSIS — Z79899 Other long term (current) drug therapy: Secondary | ICD-10-CM | POA: Diagnosis not present

## 2023-12-16 DIAGNOSIS — D573 Sickle-cell trait: Secondary | ICD-10-CM | POA: Diagnosis present

## 2023-12-16 DIAGNOSIS — Z8249 Family history of ischemic heart disease and other diseases of the circulatory system: Secondary | ICD-10-CM | POA: Diagnosis not present

## 2023-12-16 DIAGNOSIS — Z9851 Tubal ligation status: Secondary | ICD-10-CM | POA: Diagnosis not present

## 2023-12-16 DIAGNOSIS — E66812 Obesity, class 2: Secondary | ICD-10-CM | POA: Diagnosis not present

## 2023-12-16 DIAGNOSIS — E6609 Other obesity due to excess calories: Secondary | ICD-10-CM | POA: Diagnosis not present

## 2023-12-16 DIAGNOSIS — E669 Obesity, unspecified: Secondary | ICD-10-CM | POA: Diagnosis present

## 2023-12-16 DIAGNOSIS — Z6837 Body mass index (BMI) 37.0-37.9, adult: Secondary | ICD-10-CM | POA: Diagnosis not present

## 2023-12-16 DIAGNOSIS — K2970 Gastritis, unspecified, without bleeding: Secondary | ICD-10-CM | POA: Diagnosis present

## 2023-12-16 DIAGNOSIS — Z832 Family history of diseases of the blood and blood-forming organs and certain disorders involving the immune mechanism: Secondary | ICD-10-CM | POA: Diagnosis not present

## 2023-12-16 DIAGNOSIS — K2980 Duodenitis without bleeding: Secondary | ICD-10-CM | POA: Diagnosis present

## 2023-12-16 LAB — HIV ANTIBODY (ROUTINE TESTING W REFLEX): HIV Screen 4th Generation wRfx: NONREACTIVE

## 2023-12-16 MED ORDER — ORAL CARE MOUTH RINSE: 15.0000 mL | OROMUCOSAL | Status: DC | PRN

## 2023-12-16 MED ORDER — MORPHINE SULFATE (PF) 4 MG/ML IV SOLN
4.0000 mg | Freq: Once | INTRAVENOUS | Status: AC
  Administered 2023-12-16: 4 mg via INTRAVENOUS
  Filled 2023-12-16: qty 1

## 2023-12-16 MED ORDER — IOHEXOL 350 MG/ML SOLN
100.0000 mL | Freq: Once | INTRAVENOUS | Status: AC | PRN
  Administered 2023-12-16: 100 mL via INTRAVENOUS

## 2023-12-16 MED ORDER — FOLIC ACID 1 MG PO TABS
1.0000 mg | ORAL_TABLET | Freq: Every day | ORAL | Status: DC
  Administered 2023-12-16 – 2023-12-18 (×3): 1 mg via ORAL
  Filled 2023-12-16 (×3): qty 1

## 2023-12-16 MED ORDER — ADULT MULTIVITAMIN W/MINERALS CH
1.0000 | ORAL_TABLET | Freq: Every day | ORAL | Status: DC
  Administered 2023-12-16 – 2023-12-18 (×3): 1 via ORAL
  Filled 2023-12-16 (×3): qty 1

## 2023-12-16 MED ORDER — ONDANSETRON HCL 4 MG PO TABS: 4.0000 mg | ORAL_TABLET | Freq: Four times a day (QID) | ORAL | Status: DC | PRN

## 2023-12-16 MED ORDER — ONDANSETRON HCL 4 MG/2ML IJ SOLN: 4.0000 mg | Freq: Four times a day (QID) | INTRAMUSCULAR | Status: DC | PRN

## 2023-12-16 MED ORDER — DEXTROSE-SODIUM CHLORIDE 5-0.9 % IV SOLN
INTRAVENOUS | Status: AC
Start: 2023-12-16 — End: 2023-12-17

## 2023-12-16 MED ORDER — LORAZEPAM 1 MG PO TABS: 1.0000 mg | ORAL_TABLET | ORAL | Status: DC | PRN

## 2023-12-16 MED ORDER — THIAMINE MONONITRATE 100 MG PO TABS
100.0000 mg | ORAL_TABLET | Freq: Every day | ORAL | Status: DC
  Administered 2023-12-16 – 2023-12-18 (×3): 100 mg via ORAL
  Filled 2023-12-16 (×3): qty 1

## 2023-12-16 MED ORDER — PANTOPRAZOLE SODIUM 40 MG IV SOLR
40.0000 mg | Freq: Two times a day (BID) | INTRAVENOUS | Status: DC
  Administered 2023-12-16 – 2023-12-18 (×4): 40 mg via INTRAVENOUS
  Filled 2023-12-16 (×4): qty 10

## 2023-12-16 MED ORDER — ACETAMINOPHEN 325 MG PO TABS
650.0000 mg | ORAL_TABLET | Freq: Four times a day (QID) | ORAL | Status: DC | PRN
  Administered 2023-12-18: 650 mg via ORAL
  Filled 2023-12-16: qty 2

## 2023-12-16 MED ORDER — ONDANSETRON HCL 4 MG/2ML IJ SOLN
4.0000 mg | INTRAMUSCULAR | Status: AC
  Administered 2023-12-16: 4 mg via INTRAVENOUS
  Filled 2023-12-16: qty 2

## 2023-12-16 MED ORDER — HYDRALAZINE HCL 20 MG/ML IJ SOLN
10.0000 mg | INTRAMUSCULAR | Status: DC | PRN
  Administered 2023-12-16 – 2023-12-17 (×3): 10 mg via INTRAVENOUS
  Filled 2023-12-16 (×3): qty 1

## 2023-12-16 MED ORDER — PANTOPRAZOLE SODIUM 40 MG IV SOLR
40.0000 mg | INTRAVENOUS | Status: DC
  Administered 2023-12-16: 40 mg via INTRAVENOUS
  Filled 2023-12-16: qty 10

## 2023-12-16 MED ORDER — SODIUM CHLORIDE 0.9 % IV SOLN: 12.5000 mg | Freq: Four times a day (QID) | INTRAVENOUS | Status: DC | PRN

## 2023-12-16 MED ORDER — LORAZEPAM 2 MG/ML IJ SOLN: 1.0000 mg | INTRAMUSCULAR | Status: DC | PRN

## 2023-12-16 MED ORDER — LORAZEPAM 2 MG/ML IJ SOLN: 0.0000 mg | Freq: Two times a day (BID) | INTRAMUSCULAR | Status: DC

## 2023-12-16 MED ORDER — ACETAMINOPHEN 650 MG RE SUPP: 650.0000 mg | Freq: Four times a day (QID) | RECTAL | Status: DC | PRN

## 2023-12-16 MED ORDER — LACTATED RINGERS IV BOLUS
1000.0000 mL | Freq: Once | INTRAVENOUS | Status: AC
  Administered 2023-12-16: 1000 mL via INTRAVENOUS

## 2023-12-16 MED ORDER — THIAMINE HCL 100 MG/ML IJ SOLN: 100.0000 mg | Freq: Every day | INTRAMUSCULAR | Status: DC

## 2023-12-16 MED ORDER — MORPHINE SULFATE (PF) 2 MG/ML IV SOLN
2.0000 mg | INTRAVENOUS | Status: DC | PRN
  Administered 2023-12-16 (×4): 2 mg via INTRAVENOUS
  Filled 2023-12-16 (×4): qty 1

## 2023-12-16 MED ORDER — LORAZEPAM 2 MG/ML IJ SOLN: 0.0000 mg | Freq: Four times a day (QID) | INTRAMUSCULAR | Status: AC

## 2023-12-16 NOTE — Progress Notes (Signed)
Courtesy note:  Patient is seen and examined today morning.  She is admitted for acute alcoholic pancreatitis.  Patient has epigastric discomfort radiating to back, nausea.  Patient is currently on IV hydration, IV Protonix, IV antiemetics and pain medications.  Continue n.p.o., clears as tolerated.  GI evaluation appreciated.  EGD planned for tomorrow.  Further management as per clinical course.

## 2023-12-16 NOTE — ED Notes (Signed)
Pt to CT scan.

## 2023-12-16 NOTE — ED Provider Notes (Signed)
[facility] Provider Note    Event Date/Time   First MD Initiated Contact with Patient 12/16/23 0001     (approximate)   History   Abdominal Pain, Emesis, and Chest Pain   HPI Stephanie Jacobs is a 57 y.o. female who presents for evaluation of 1 day of abdominal pain radiating into her chest with associated nausea and vomiting.  She has not had pain like this in the past.  She still has a gallbladder and she reports that she drinks several glasses of wine every day.  She does not think she has withdrawal symptoms when she does not drink but she does drink every evening after work but does not drink during the day.  She said that she drank quite a bit more than usual the previous day and then then the symptoms started this morning.  Pain is sharp and persistent.     Physical Exam   Triage Vital Signs: ED Triage Vitals  Encounter Vitals Group     BP 12/15/23 1613 (!) 185/130     Systolic BP Percentile --      Diastolic BP Percentile --      Pulse Rate 12/15/23 1613 78     Resp 12/15/23 1613 17     Temp 12/15/23 1613 97.7 F (36.5 C)     Temp src --      SpO2 12/15/23 1613 100 %     Weight 12/15/23 1614 95.3 kg (210 lb)     Height 12/15/23 1614 1.6 m (5\' 3" )     Head Circumference --      Peak Flow --      Pain Score 12/15/23 1614 10     Pain Loc --      Pain Education --      Exclude from Growth Chart --     Most recent vital signs: Vitals:   12/16/23 0446 12/16/23 0458  BP: 109/70 109/70  Pulse: (!) 109 (!) 109  Resp: 16   Temp: 98.6 F (37 C)   SpO2: 94%     General: Awake, appears uncomfortable but without severe distress. CV:  Good peripheral perfusion.  Regular rate and rhythm Resp:  Normal effort. Speaking easily and comfortably, no accessory muscle usage nor intercostal retractions.   Abd:  Tender to palpation throughout the abdomen but particularly in the epigastrium.  Equivocal Murphy sign.   ED Results / Procedures /  Treatments   Labs (all labs ordered are listed, but only abnormal results are displayed) Labs Reviewed  BASIC METABOLIC PANEL - Abnormal; Notable for the following components:      Result Value   Sodium 134 (*)    CO2 19 (*)    Glucose, Bld 146 (*)    Anion gap 16 (*)    All other components within normal limits  CBC - Abnormal; Notable for the following components:   WBC 11.7 (*)    RBC 5.39 (*)    Hemoglobin 16.5 (*)    HCT 46.4 (*)    All other components within normal limits  LIPASE, BLOOD - Abnormal; Notable for the following components:   Lipase 1,150 (*)    All other components within normal limits  HEPATIC FUNCTION PANEL - Abnormal; Notable for the following components:   AST 67 (*)    ALT 67 (*)    Total Bilirubin 1.2 (*)    Indirect Bilirubin 1.0 (*)    All other components within normal limits  HIV ANTIBODY (ROUTINE TESTING W REFLEX)  TROPONIN I (HIGH SENSITIVITY)  TROPONIN I (HIGH SENSITIVITY)     EKG  ED ECG REPORT I, Loleta Rose, the attending physician, personally viewed and interpreted this ECG.  Date: 12/15/2023 EKG Time: 16: 19 Rate: 70 Rhythm: normal sinus rhythm QRS Axis: normal Intervals: LVH ST/T Wave abnormalities: normal Narrative Interpretation: no evidence of acute ischemia    RADIOLOGY I viewed and interpreted the patient's two-view chest x-ray and I see no evidence of pneumonia.  I also read the radiologist's report, which confirmed no acute findings.  I also reviewed and interpreted the patient's CT abdomen/pelvis.  See hospital course for details.   PROCEDURES:  Critical Care performed: No  Procedures    IMPRESSION / MDM / ASSESSMENT AND PLAN / ED COURSE  I reviewed the triage vital signs and the nursing notes.                              Differential diagnosis includes, but is not limited to, pancreatitis, biliary disease, alcohol withdrawal, diverticulitis, appendicitis  Patient's presentation is most consistent  with acute presentation with potential threat to life or bodily function.  Labs/studies ordered: CT abdomen pelvis, two-view chest x-ray, EKG, CBC, lipase, BMP, hepatic function panel, high-sensitivity troponin  Interventions/Medications given:  Medications  morphine (PF) 2 MG/ML injection 2 mg (2 mg Intravenous Given 12/16/23 0529)  pantoprazole (PROTONIX) injection 40 mg (40 mg Intravenous Given 12/16/23 0524)  hydrALAZINE (APRESOLINE) injection 10 mg (has no administration in time range)  LORazepam (ATIVAN) tablet 1-4 mg (has no administration in time range)    Or  LORazepam (ATIVAN) injection 1-4 mg (has no administration in time range)  thiamine (VITAMIN B1) tablet 100 mg (has no administration in time range)    Or  thiamine (VITAMIN B1) injection 100 mg (has no administration in time range)  folic acid (FOLVITE) tablet 1 mg (has no administration in time range)  multivitamin with minerals tablet 1 tablet (has no administration in time range)  acetaminophen (TYLENOL) tablet 650 mg (has no administration in time range)    Or  acetaminophen (TYLENOL) suppository 650 mg (has no administration in time range)  ondansetron (ZOFRAN) tablet 4 mg (has no administration in time range)    Or  ondansetron (ZOFRAN) injection 4 mg (has no administration in time range)  LORazepam (ATIVAN) injection 0-4 mg ( Intravenous Not Given 12/16/23 0500)    Followed by  LORazepam (ATIVAN) injection 0-4 mg (has no administration in time range)  dextrose 5 %-0.9 % sodium chloride infusion ( Intravenous New Bag/Given 12/16/23 0526)  promethazine (PHENERGAN) 12.5 mg in sodium chloride 0.9 % 50 mL IVPB (has no administration in time range)  ondansetron (ZOFRAN-ODT) disintegrating tablet 4 mg (4 mg Oral Given 12/15/23 1622)  lactated ringers bolus 1,000 mL (0 mLs Intravenous Stopped 12/16/23 0300)  morphine (PF) 4 MG/ML injection 4 mg (4 mg Intravenous Given 12/16/23 0026)  ondansetron (ZOFRAN) injection 4 mg  (4 mg Intravenous Given 12/16/23 0025)  iohexol (OMNIPAQUE) 350 MG/ML injection 100 mL (100 mLs Intravenous Contrast Given 12/16/23 0117)  morphine (PF) 4 MG/ML injection 4 mg (4 mg Intravenous Given 12/16/23 0306)    (Note:  hospital course my include additional interventions and/or labs/studies not listed above.)   Patient's lipase is greater than 1100.  Hepatic function panel is essentially normal with only slight AST and ALT elevation which could be attributable to her alcohol dependence.  I doubt gallbladder disease as the primary issue but we will further assess with a CT to further investigate the possibility of pancreatitis and to get a look at the gallbladder and common bile duct.  No indication for ultrasound at this time.  I ordered morphine 4 mg IV and Zofran 4 mg IV along with 1 L LR and asked the patient to remain NPO.  Given the amount of pain and tenderness she is experiencing anticipate she will require admission for pancreatitis     Clinical Course as of 12/16/23 6440  Fri Dec 16, 2023  0220 CT ABDOMEN PELVIS W CONTRAST I viewed and interpreted the patient's CT of the abdomen and pelvis and she has inflammation around the pancreas suggestive of pancreatitis.  Radiologist confirmed.  The radiologist also commented on the normal appearance of the gallbladder and common bile duct which is reassuring.  The patient is more comfortable now but has not been able to tolerate any oral intake.  Given the radiographic demonstrated pancreatitis and significantly evaded lipase, I feel that she would be best served in the hospital.  I gave her the offer of going home if she desires to do so but she thinks she better stay as well.  I am consulting the hospitalist for admission. [CF]    Clinical Course User Index [CF] Loleta Rose, MD     FINAL CLINICAL IMPRESSION(S) / ED DIAGNOSES   Final diagnoses:  Alcohol-induced acute pancreatitis without infection or necrosis  Alcohol dependence  with unspecified alcohol-induced disorder (HCC)     Rx / DC Orders   ED Discharge Orders     None        Note:  This document was prepared using Dragon voice recognition software and may include unintentional dictation errors.   Loleta Rose, MD 12/16/23 [id number]

## 2023-12-16 NOTE — Assessment & Plan Note (Addendum)
Severe gastritis and duodenitis Possible duodenal ulceration IV hydration, IV Protonix, IV antiemetics, IV pain meds Will keep n.p.o. except for ice chips GI consult

## 2023-12-16 NOTE — ED Notes (Signed)
Pt laying on left side. Resting with eyes closed. Respirations even and non labored.

## 2023-12-16 NOTE — TOC CM/SW Note (Signed)
TOC received consult for Substance Abuse Resources. Resources added to the AVS.   Alfonso Ramus, LCSW Transitions of Care Department [phone]

## 2023-12-16 NOTE — Anesthesia Preprocedure Evaluation (Addendum)
Anesthesia Evaluation  Patient identified by MRN, date of birth, ID band Patient awake    Reviewed: Allergy & Precautions, H&P , NPO status , Patient's Chart, lab work & pertinent test results  Airway Mallampati: IV  TM Distance: >3 FB Neck ROM: full    Dental no notable dental hx.    Pulmonary Current Smoker and Patient abstained from smoking.   Pulmonary exam normal        Cardiovascular hypertension,  Rhythm:Regular Rate:Tachycardia     Neuro/Psych negative neurological ROS  negative psych ROS   GI/Hepatic negative GI ROS,,,(+)     substance abuse  alcohol use  Endo/Other  negative endocrine ROS    Renal/GU negative Renal ROS  negative genitourinary   Musculoskeletal   Abdominal  (+) + obese  Peds  Hematology negative hematology ROS (+)   Anesthesia Other Findings Acute alcoholic pancreatitis 2/2 alcohol use. She usually drinks 3-4 glasses of wine daily but during the holidays, her amount increases.  Pt denies vomiting since yesterday and only endorses minimal belching. Her gastric ultrasound  Past Medical History: 08/27/2022: Elevated liver enzymes No date: Hypertension No date: Sickle cell trait [facility])  Past Surgical History: No date: ABDOMINAL HYSTERECTOMY 05/02/2018: BREAST BIOPSY; Right     Comment:  Affirm Bx- Fibrocystic breast tissue - coil clip 07/17/2021: COLONOSCOPY; N/A     Comment:  Procedure: COLONOSCOPY;  Surgeon: Wyline Mood, MD;                Location: [facility] ENDOSCOPY;  Service: Gastroenterology;                Laterality: N/A; 02/22/2020: COLONOSCOPY WITH PROPOFOL; N/A     Comment:  Procedure: COLONOSCOPY WITH PROPOFOL;  Surgeon: Wyline Mood, MD;  Location: [facility] ENDOSCOPY;  Service:               Gastroenterology;  Laterality: N/A; 08/07/2019: CYSTOSCOPY     Comment:  Procedure: CYSTOSCOPY;  Surgeon: Nadara Mustard, MD;                Location: [facility] ORS;  Service:  Gynecology;; No date: DIAGNOSTIC LAPAROSCOPY 08/21/2020: KNEE ARTHROSCOPY WITH MEDIAL MENISECTOMY; Right     Comment:  Procedure: RIGHT KNEE ARTHROSCOPY WITH MEDIAL               MENISECTOMY;  Surgeon: Juanell Fairly, MD;  Location:               [facility] ORS;  Service: Orthopedics;  Laterality: Right; 08/07/2019: TOTAL LAPAROSCOPIC HYSTERECTOMY WITH BILATERAL SALPINGO  OOPHORECTOMY; N/A     Comment:  Procedure: TOTAL LAPAROSCOPIC HYSTERECTOMY WITH               BILATERAL SALPINGO;  Surgeon: Nadara Mustard, MD;                Location: [facility] ORS;  Service: Gynecology;  Laterality:               N/A; 1995: TUBAL LIGATION  BMI    Body Mass Index: 37.20 kg/m      Reproductive/Obstetrics negative OB ROS                             Anesthesia Physical Anesthesia Plan  ASA: 3  Anesthesia Plan: General   Post-op Pain Management:    Induction: Intravenous  PONV Risk Score and  Plan: 2 and Propofol infusion  Airway Management Planned: Natural Airway  Additional Equipment:   Intra-op Plan:   Post-operative Plan: Extubation in OR  Informed Consent: I have reviewed the patients History and Physical, chart, labs and discussed the procedure including the risks, benefits and alternatives for the proposed anesthesia with the patient or authorized representative who has indicated his/her understanding and acceptance.     Dental Advisory Given  Plan Discussed with: CRNA and Surgeon  Anesthesia Plan Comments:         Anesthesia Quick Evaluation

## 2023-12-16 NOTE — ED Notes (Signed)
Pt resting quietly on right side. Eyes closed. Respirations even and non labored.

## 2023-12-16 NOTE — H&P (Addendum)
History and Physical    Patient: Stephanie Jacobs UJW:[id number] DOB: Feb 24, 1966 DOA: 12/16/2023 DOS: the patient was seen and examined on 12/16/2023 PCP: Jacky Kindle, FNP  Patient coming from: Home  Chief Complaint:  Chief Complaint  Patient presents with   Abdominal Pain   Emesis   Chest Pain    HPI: Stephanie Jacobs is a 57 y.o. female with medical history significant for Hypertension and alcohol use disorder who presents to the ED with a several hour history of abdominal pain and vomiting.  Denies fever chills or change in bowel habits.  Denies dysuria, chest pain or shortness of breath. ED course and data review: BP 185/130 on arrival with otherwise normal vitals Labs notable for lipase 1150, AST 67, ALT 67, total bilirubin 1.2 WBC 11,000 with hemoglobin 16.5 Anion gap 16 with bicarb 19 and normal creatinine Troponin 7 EKG, personally viewed and interpreted showing NSR at 70 with nonspecific ST-T wave changes.  CT abdomen and pelvis consistent with acute pancreatitis, moderate to marked severity gastritis and duodenitis with possible ulceration of the proximal duodenum, recommendation for endoscopy.  No gallstones, gallbladder wall thickening or biliary dilatation Patient treated with morphine Zofran IV fluids Hospitalist consulted for admission.   Review of Systems: As mentioned in the history of present illness. All other systems reviewed and are negative.  Past Medical History:  Diagnosis Date   Elevated liver enzymes 08/27/2022   Hypertension    Sickle cell trait [facility])    Past Surgical History:  Procedure Laterality Date   ABDOMINAL HYSTERECTOMY     BREAST BIOPSY Right 05/02/2018   Affirm Bx- Fibrocystic breast tissue - coil clip   COLONOSCOPY N/A 07/17/2021   Procedure: COLONOSCOPY;  Surgeon: Wyline Mood, MD;  Location: [facility] ENDOSCOPY;  Service: Gastroenterology;  Laterality: N/A;   COLONOSCOPY WITH PROPOFOL N/A 02/22/2020   Procedure: COLONOSCOPY WITH PROPOFOL;   Surgeon: Wyline Mood, MD;  Location: [facility] ENDOSCOPY;  Service: Gastroenterology;  Laterality: N/A;   CYSTOSCOPY  08/07/2019   Procedure: CYSTOSCOPY;  Surgeon: Nadara Mustard, MD;  Location: [facility] ORS;  Service: Gynecology;;   DIAGNOSTIC LAPAROSCOPY     KNEE ARTHROSCOPY WITH MEDIAL MENISECTOMY Right 08/21/2020   Procedure: RIGHT KNEE ARTHROSCOPY WITH MEDIAL MENISECTOMY;  Surgeon: Juanell Fairly, MD;  Location: [facility] ORS;  Service: Orthopedics;  Laterality: Right;   TOTAL LAPAROSCOPIC HYSTERECTOMY WITH BILATERAL SALPINGO OOPHORECTOMY N/A 08/07/2019   Procedure: TOTAL LAPAROSCOPIC HYSTERECTOMY WITH BILATERAL SALPINGO;  Surgeon: Nadara Mustard, MD;  Location: [facility] ORS;  Service: Gynecology;  Laterality: N/A;   TUBAL LIGATION  1995   Social History:  reports that she has been smoking cigarettes. She has a 35 pack-year smoking history. She has never used smokeless tobacco. She reports current alcohol use of about 7.0 standard drinks of alcohol per week. She reports that she does not use drugs.  Allergies  Allergen Reactions   Tape Rash    Family History  Problem Relation Age of Onset   Hypertension Mother    Diabetes Mother    Sickle cell anemia Brother    Sickle cell trait Brother    Breast cancer Neg Hx     Prior to Admission medications   Medication Sig Start Date End Date Taking? Authorizing Provider  amLODipine (NORVASC) 10 MG tablet Take 1 tablet (10 mg total) by mouth daily. 08/20/22   Jacky Kindle, FNP  lactulose (CHRONULAC) 10 GM/15ML solution Take 15 mLs (10 g total) by mouth daily as needed for mild constipation.  09/01/22   Jacky Kindle, FNP  losartan-hydrochlorothiazide (HYZAAR) 100-25 MG tablet Take 1 tablet by mouth daily. 08/20/22   Jacky Kindle, FNP  metoprolol tartrate (LOPRESSOR) 25 MG tablet TAKE 1 TABLET TWICE DAILY 08/20/22   Merita Norton T, FNP  Multiple Vitamin (MULTIVITAMIN) tablet Take 1 tablet by mouth daily.    [provider]  PAXLOVID, 300/100, 20 x 150  MG & 10 x 100MG  TBPK Take by mouth 2 (two) times daily. 02/18/23   [provider]  promethazine-dextromethorphan (PROMETHAZINE-DM) 6.25-15 MG/5ML syrup Take 5 mLs by mouth every 4 (four) hours as needed. 02/18/23   [provider]  rosuvastatin (CRESTOR) 20 MG tablet Take 1 tablet (20 mg total) by mouth daily. 08/25/22   Jacky Kindle, FNP  valACYclovir (VALTREX) 1000 MG tablet Take 1 tablet (1,000 mg total) by mouth 2 (two) times daily. Use one tablet, twice daily for flares; 3-7 days typically course 08/20/22   Jacky Kindle, FNP    Physical Exam: Vitals:   12/15/23 1614 12/16/23 0039 12/16/23 0042 12/16/23 0158  BP:  (!) 178/96  (!) 188/99  Pulse:  78  96  Resp:  18  18  Temp:   97.7 F (36.5 C)   SpO2:  99%  99%  Weight: 95.3 kg     Height: 5\' 3"  (1.6 m)      Physical Exam Vitals and nursing note reviewed.  Constitutional:      General: She is not in acute distress.    Appearance: She is ill-appearing.  HENT:     Head: Normocephalic and atraumatic.  Cardiovascular:     Rate and Rhythm: Normal rate and regular rhythm.     Heart sounds: Normal heart sounds.  Pulmonary:     Effort: Pulmonary effort is normal.     Breath sounds: Normal breath sounds.  Abdominal:     Palpations: Abdomen is soft.     Tenderness: There is abdominal tenderness in the epigastric area.  Neurological:     Mental Status: Mental status is at baseline.     Labs on Admission: I have personally reviewed following labs and imaging studies  CBC: Recent Labs  Lab 12/15/23 1617  WBC 11.7*  HGB 16.5*  HCT 46.4*  MCV 86.1  PLT 295   Basic Metabolic Panel: Recent Labs  Lab 12/15/23 1617  NA 134*  K 3.6  CL 99  CO2 19*  GLUCOSE 146*  BUN 8  CREATININE 0.49  CALCIUM 9.0   GFR: Estimated Creatinine Clearance: 87.3 mL/min (by C-G formula based on SCr of 0.49 mg/dL). Liver Function Tests: Recent Labs  Lab 12/15/23 1815  AST 67*  ALT 67*  ALKPHOS 70  BILITOT 1.2*  PROT  8.1  ALBUMIN 4.5   Recent Labs  Lab 12/15/23 1617  LIPASE 1,150*   No results for input(s): "AMMONIA" in the last 168 hours. Coagulation Profile: No results for input(s): "INR", "PROTIME" in the last 168 hours. Cardiac Enzymes: No results for input(s): "CKTOTAL", "CKMB", "CKMBINDEX", "TROPONINI" in the last 168 hours. BNP (last 3 results) No results for input(s): "PROBNP" in the last 8760 hours. HbA1C: No results for input(s): "HGBA1C" in the last 72 hours. CBG: No results for input(s): "GLUCAP" in the last 168 hours. Lipid Profile: No results for input(s): "CHOL", "HDL", "LDLCALC", "TRIG", "CHOLHDL", "LDLDIRECT" in the last 72 hours. Thyroid Function Tests: No results for input(s): "TSH", "T4TOTAL", "FREET4", "T3FREE", "THYROIDAB" in the last 72 hours. Anemia Panel: No results for  input(s): "VITAMINB12", "FOLATE", "FERRITIN", "TIBC", "IRON", "RETICCTPCT" in the last 72 hours. Urine analysis:    Component Value Date/Time   COLORURINE Straw 03/05/2012 0217   APPEARANCEUR Clear 03/05/2012 0217   LABSPEC 1.009 03/05/2012 0217   PHURINE 5.0 03/05/2012 0217   GLUCOSEU Negative 03/05/2012 0217   HGBUR 1+ 03/05/2012 0217   BILIRUBINUR Negative 03/05/2012 0217   KETONESUR Negative 03/05/2012 0217   PROTEINUR Negative 03/05/2012 0217   NITRITE Negative 03/05/2012 0217   LEUKOCYTESUR Negative 03/05/2012 0217    Radiological Exams on Admission: CT ABDOMEN PELVIS W CONTRAST Result Date: 12/16/2023 CLINICAL DATA:  Chest pain and vomiting. EXAM: CT ABDOMEN AND PELVIS WITH CONTRAST TECHNIQUE: Multidetector CT imaging of the abdomen and pelvis was performed using the standard protocol following bolus administration of intravenous contrast. RADIATION DOSE REDUCTION: This exam was performed according to the departmental dose-optimization program which includes automated exposure control, adjustment of the mA and/or kV according to patient size and/or use of iterative reconstruction  technique. CONTRAST:  OMNIPAQUE IOHEXOL 350 MG/ML SOLN COMPARISON:  None Available. FINDINGS: Lower chest: Mild atelectatic changes are seen within the bilateral lung bases. Hepatobiliary: There is diffuse fatty infiltration of the liver parenchyma. A 7 mm focus of parenchymal low attenuation is seen within the posterior aspect of the right lobe of the liver. No gallstones, gallbladder wall thickening, or biliary dilatation. Pancreas: Mild to moderate severity peripancreatic inflammatory fat stranding is seen. This is more severe along the adjacent portions of the stomach, proximal duodenum and colon. There is no evidence of pancreatic ductal dilatation. Spleen: Normal in size without focal abnormality. Adrenals/Urinary Tract: Adrenal glands are unremarkable. Kidneys are normal, without renal calculi, focal lesion, or hydronephrosis. Bladder is unremarkable. Stomach/Bowel: Mild posterior gastric wall thickening is seen in the region of the gastric body. Appendix appears normal. No evidence of bowel dilatation. Previously noted moderate to marked severity areas of perigastric, peri-duodenal and pericolonic inflammatory fat stranding are seen. Small, ill-defined areas of heterogeneous low attenuation are seen involving the medial wall of the proximal duodenum (axial CT images 39 through 44, CT series 2). No intra-abdominal free air is identified. Vascular/Lymphatic: Aortic atherosclerosis. No enlarged abdominal or pelvic lymph nodes. Reproductive: Status post hysterectomy. No adnexal masses. Other: No abdominal wall hernia or abnormality. A small amount of posterior pelvic free fluid is seen (approximately 17.52 Hounsfield units). Musculoskeletal: No acute or significant osseous findings. IMPRESSION: 1. Findings consistent with acute pancreatitis with adjacent, moderate to marked severity gastritis and duodenitis. Correlation with pancreatic enzymes is recommended. 2. Low-attenuation areas involving the wall of  the proximal duodenum which may represent areas of duodenal ulceration. GI consult and subsequent endoscopy is recommended. 3. Small amount of posterior pelvic free fluid. 4. Hepatic steatosis. 5. Aortic atherosclerosis. Aortic Atherosclerosis (ICD10-I70.0). Electronically Signed   By: Aram Candela M.D.   On: 12/16/2023 02:07   DG Chest 2 View Result Date: 12/15/2023 CLINICAL DATA:  Chest and abdominal pain EXAM: CHEST - 2 VIEW COMPARISON:  09/04/2018 FINDINGS: The heart size and mediastinal contours are within normal limits. Both lungs are clear. The visualized skeletal structures are unremarkable. IMPRESSION: No active cardiopulmonary disease. Electronically Signed   By: Alcide Clever M.D.   On: 12/15/2023 17:23     Data Reviewed: Relevant notes from primary care and specialist visits, past discharge summaries as available in EHR, including Care Everywhere. Prior diagnostic testing as pertinent to current admission diagnoses Updated medications and problem lists for reconciliation ED course, including vitals, labs, imaging,  treatment and response to treatment Triage notes, nursing and pharmacy notes and ED provider's notes Notable results as noted in HPI   Assessment and Plan: * Acute alcoholic pancreatitis Severe gastritis and duodenitis Possible duodenal ulceration IV hydration, IV Protonix, IV antiemetics, IV pain meds Will keep n.p.o. except for ice chips GI consult  Hypertensive urgency Likely related to pain Pain control IV hydralazine as needed for BP control while unable to tolerate orally  Alcohol use disorder CIWA withdrawal protocol        DVT prophylaxis: SCD  Consults: GI Dr Servando Snare  Advance Care Planning: full code  Family Communication: none  Disposition Plan: Back to previous home environment  Severity of Illness: The appropriate patient status for this patient is INPATIENT. Inpatient status is judged to be reasonable and necessary in order to  provide the required intensity of service to ensure the patient's safety. The patient's presenting symptoms, physical exam findings, and initial radiographic and laboratory data in the context of their chronic comorbidities is felt to place them at high risk for further clinical deterioration. Furthermore, it is not anticipated that the patient will be medically stable for discharge from the hospital within 2 midnights of admission.   * I certify that at the point of admission it is my clinical judgment that the patient will require inpatient hospital care spanning beyond 2 midnights from the point of admission due to high intensity of service, high risk for further deterioration and high frequency of surveillance required.*  Author: Andris Baumann, MD 12/16/2023 4:44 AM  For on call review [email or web address].

## 2023-12-16 NOTE — ED Notes (Signed)
 Pt resting with eyes closed. Respirations even and nonlabored.

## 2023-12-16 NOTE — Assessment & Plan Note (Signed)
CIWA withdrawal protocol

## 2023-12-16 NOTE — Consult Note (Signed)
Consultation  Referring Provider:     Hospitalist Admit date: 12/27 Consult date: 12/16/2023         Reason for Consultation: Abnormal imaging              HPI:   Stephanie Jacobs is a 57 y.o. lady with history of hypertension, alcohol use, and tobacco abuse here with acute pancreatitis 2/2 alcohol. Patient notes she usually drinks 3-4 glasses of wine daily but during the holidays, her amount increases. Noted abdominal pain Christmas eve and a small amount of blood after vomiting. On CT she has pancreatitis, severe gastritis, and potentially ulceration in the proximal duodenum. No melena or recurrrent hematemesis. Her pain is better now after IV fluids. No blood thinners. No family history of GI malignancies. No melena. She has a history of a hysterectomy.  Past Medical History:  Diagnosis Date   Elevated liver enzymes 08/27/2022   Hypertension    Sickle cell trait [facility])     Past Surgical History:  Procedure Laterality Date   ABDOMINAL HYSTERECTOMY     BREAST BIOPSY Right 05/02/2018   Affirm Bx- Fibrocystic breast tissue - coil clip   COLONOSCOPY N/A 07/17/2021   Procedure: COLONOSCOPY;  Surgeon: Wyline Mood, MD;  Location: [facility] ENDOSCOPY;  Service: Gastroenterology;  Laterality: N/A;   COLONOSCOPY WITH PROPOFOL N/A 02/22/2020   Procedure: COLONOSCOPY WITH PROPOFOL;  Surgeon: Wyline Mood, MD;  Location: [facility] ENDOSCOPY;  Service: Gastroenterology;  Laterality: N/A;   CYSTOSCOPY  08/07/2019   Procedure: CYSTOSCOPY;  Surgeon: Nadara Mustard, MD;  Location: [facility] ORS;  Service: Gynecology;;   DIAGNOSTIC LAPAROSCOPY     KNEE ARTHROSCOPY WITH MEDIAL MENISECTOMY Right 08/21/2020   Procedure: RIGHT KNEE ARTHROSCOPY WITH MEDIAL MENISECTOMY;  Surgeon: Juanell Fairly, MD;  Location: [facility] ORS;  Service: Orthopedics;  Laterality: Right;   TOTAL LAPAROSCOPIC HYSTERECTOMY WITH BILATERAL SALPINGO OOPHORECTOMY N/A 08/07/2019   Procedure: TOTAL LAPAROSCOPIC HYSTERECTOMY WITH BILATERAL SALPINGO;  Surgeon:  Nadara Mustard, MD;  Location: [facility] ORS;  Service: Gynecology;  Laterality: N/A;   TUBAL LIGATION  1995    Family History  Problem Relation Age of Onset   Hypertension Mother    Diabetes Mother    Sickle cell anemia Brother    Sickle cell trait Brother    Breast cancer Neg Hx     Social History   Tobacco Use   Smoking status: Every Day    Current packs/day: 1.00    Average packs/day: 1 pack/day for 35.0 years (35.0 ttl pk-yrs)    Types: Cigarettes   Smokeless tobacco: Never  Vaping Use   Vaping status: Never Used  Substance Use Topics   Alcohol use: Yes    Alcohol/week: 7.0 standard drinks of alcohol    Types: 7 Glasses of wine per week    Comment: One glass of red wine in the evenings.   Drug use: No    Prior to Admission medications   Medication Sig Start Date End Date Taking? Authorizing Provider  amLODipine (NORVASC) 10 MG tablet Take 1 tablet (10 mg total) by mouth daily. 08/20/22   Jacky Kindle, FNP  lactulose (CHRONULAC) 10 GM/15ML solution Take 15 mLs (10 g total) by mouth daily as needed for mild constipation. 09/01/22   Jacky Kindle, FNP  losartan-hydrochlorothiazide (HYZAAR) 100-25 MG tablet Take 1 tablet by mouth daily. 08/20/22   Jacky Kindle, FNP  metoprolol tartrate (LOPRESSOR) 25 MG tablet TAKE 1 TABLET TWICE DAILY 08/20/22   Jacky Kindle, FNP  Multiple Vitamin (MULTIVITAMIN) tablet Take 1 tablet by mouth daily.    [provider]  PAXLOVID, 300/100, 20 x 150 MG & 10 x 100MG  TBPK Take by mouth 2 (two) times daily. 02/18/23   [provider]  promethazine-dextromethorphan (PROMETHAZINE-DM) 6.25-15 MG/5ML syrup Take 5 mLs by mouth every 4 (four) hours as needed. 02/18/23   [provider]  rosuvastatin (CRESTOR) 20 MG tablet Take 1 tablet (20 mg total) by mouth daily. 08/25/22   Jacky Kindle, FNP  valACYclovir (VALTREX) 1000 MG tablet Take 1 tablet (1,000 mg total) by mouth 2 (two) times daily. Use one tablet, twice daily for flares;  3-7 days typically course 08/20/22   Jacky Kindle, FNP    Current Facility-Administered Medications  Medication Dose Route Frequency Provider Last Rate Last Admin   acetaminophen (TYLENOL) tablet 650 mg  650 mg Oral Q6H PRN Andris Baumann, MD       Or   acetaminophen (TYLENOL) suppository 650 mg  650 mg Rectal Q6H PRN Andris Baumann, MD       dextrose 5 %-0.9 % sodium chloride infusion   Intravenous Continuous Andris Baumann, MD 40 mL/hr at 12/16/23 0526 New Bag at 12/16/23 0526   folic acid (FOLVITE) tablet 1 mg  1 mg Oral Daily Lindajo Royal V, MD   1 mg at 12/16/23 1610   hydrALAZINE (APRESOLINE) injection 10 mg  10 mg Intravenous Q4H PRN Andris Baumann, MD       LORazepam (ATIVAN) injection 0-4 mg  0-4 mg Intravenous Q6H Andris Baumann, MD       Followed by   Melene Muller ON 12/18/2023] LORazepam (ATIVAN) injection 0-4 mg  0-4 mg Intravenous Q12H Andris Baumann, MD       LORazepam (ATIVAN) tablet 1-4 mg  1-4 mg Oral Q1H PRN Andris Baumann, MD       Or   LORazepam (ATIVAN) injection 1-4 mg  1-4 mg Intravenous Q1H PRN Andris Baumann, MD       morphine (PF) 2 MG/ML injection 2 mg  2 mg Intravenous Q2H PRN Andris Baumann, MD   2 mg at 12/16/23 1122   multivitamin with minerals tablet 1 tablet  1 tablet Oral Daily Andris Baumann, MD   1 tablet at 12/16/23 0907   ondansetron (ZOFRAN) tablet 4 mg  4 mg Oral Q6H PRN Andris Baumann, MD       Or   ondansetron [facility]) injection 4 mg  4 mg Intravenous Q6H PRN Andris Baumann, MD       pantoprazole (PROTONIX) injection 40 mg  40 mg Intravenous Q24H Lindajo Royal V, MD   40 mg at 12/16/23 0524   promethazine (PHENERGAN) 12.5 mg in sodium chloride 0.9 % 50 mL IVPB  12.5 mg Intravenous Q6H PRN Andris Baumann, MD       thiamine (VITAMIN B1) tablet 100 mg  100 mg Oral Daily Lindajo Royal V, MD   100 mg at 12/16/23 9604   Or   thiamine (VITAMIN B1) injection 100 mg  100 mg Intravenous Daily Andris Baumann, MD       Current Outpatient Medications   Medication Sig Dispense Refill   amLODipine (NORVASC) 10 MG tablet Take 1 tablet (10 mg total) by mouth daily. 90 tablet 3   lactulose (CHRONULAC) 10 GM/15ML solution Take 15 mLs (10 g total) by mouth daily as needed for mild constipation. 236 mL 1   losartan-hydrochlorothiazide (HYZAAR)  100-25 MG tablet Take 1 tablet by mouth daily. 90 tablet 3   metoprolol tartrate (LOPRESSOR) 25 MG tablet TAKE 1 TABLET TWICE DAILY 180 tablet 3   Multiple Vitamin (MULTIVITAMIN) tablet Take 1 tablet by mouth daily.     PAXLOVID, 300/100, 20 x 150 MG & 10 x 100MG  TBPK Take by mouth 2 (two) times daily.     promethazine-dextromethorphan (PROMETHAZINE-DM) 6.25-15 MG/5ML syrup Take 5 mLs by mouth every 4 (four) hours as needed.     rosuvastatin (CRESTOR) 20 MG tablet Take 1 tablet (20 mg total) by mouth daily. 90 tablet 3   valACYclovir (VALTREX) 1000 MG tablet Take 1 tablet (1,000 mg total) by mouth 2 (two) times daily. Use one tablet, twice daily for flares; 3-7 days typically course 20 tablet 11    Allergies as of 12/15/2023 - Review Complete 12/15/2023  Allergen Reaction Noted   Tape Rash 05/07/2015     Review of Systems:    All systems reviewed and negative except where noted in HPI.  Review of Systems  Constitutional:  Negative for chills and fever.  Respiratory:  Negative for shortness of breath.   Cardiovascular:  Negative for chest pain.  Gastrointestinal:  Positive for abdominal pain and vomiting. Negative for blood in stool, constipation, diarrhea and melena.  Musculoskeletal:  Negative for joint pain.  Skin:  Negative for rash.  Psychiatric/Behavioral:  Positive for substance abuse.   All other systems reviewed and are negative.      Physical Exam:  Vital signs in last 24 hours: Temp:  [97.7 F (36.5 C)-98.6 F (37 C)] 97.9 F (36.6 C) (12/27 1131) Pulse Rate:  [59-109] 100 (12/27 1131) Resp:  [16-18] 16 (12/27 1131) BP: (109-188)/(70-130) 185/109 (12/27 1131) SpO2:  [94 %-100 %]  97 % (12/27 0845) Weight:  [95.3 kg] 95.3 kg (12/26 1614)   General:   Pleasant in NAD Head:  Normocephalic and atraumatic. Eyes:   No icterus.   Conjunctiva pink. Mouth: Mucosa pink moist, no lesions. Neck:  Supple; no masses felt Lungs:  No respiratory distress Abdomen:   Flat, soft, nondistended, nontender Msk:  MAEW x4, No clubbing or cyanosis. Strength 5/5. Symmetrical without gross deformities. Neurologic:  Alert and  oriented x4;  No focal deficits Skin:  Warm, dry, pink without significant lesions or rashes. Psych:  Alert and cooperative. Normal affect.  LAB RESULTS: Recent Labs    12/15/23 1617  WBC 11.7*  HGB 16.5*  HCT 46.4*  PLT 295   BMET Recent Labs    12/15/23 1617  NA 134*  K 3.6  CL 99  CO2 19*  GLUCOSE 146*  BUN 8  CREATININE 0.49  CALCIUM 9.0   LFT Recent Labs    12/15/23 1815  PROT 8.1  ALBUMIN 4.5  AST 67*  ALT 67*  ALKPHOS 70  BILITOT 1.2*  BILIDIR 0.2  IBILI 1.0*   PT/INR No results for input(s): "LABPROT", "INR" in the last 72 hours.  STUDIES: CT ABDOMEN PELVIS W CONTRAST Result Date: 12/16/2023 CLINICAL DATA:  Chest pain and vomiting. EXAM: CT ABDOMEN AND PELVIS WITH CONTRAST TECHNIQUE: Multidetector CT imaging of the abdomen and pelvis was performed using the standard protocol following bolus administration of intravenous contrast. RADIATION DOSE REDUCTION: This exam was performed according to the departmental dose-optimization program which includes automated exposure control, adjustment of the mA and/or kV according to patient size and/or use of iterative reconstruction technique. CONTRAST:  OMNIPAQUE IOHEXOL 350 MG/ML SOLN COMPARISON:  None Available. FINDINGS:  Lower chest: Mild atelectatic changes are seen within the bilateral lung bases. Hepatobiliary: There is diffuse fatty infiltration of the liver parenchyma. A 7 mm focus of parenchymal low attenuation is seen within the posterior aspect of the right lobe of the liver. No  gallstones, gallbladder wall thickening, or biliary dilatation. Pancreas: Mild to moderate severity peripancreatic inflammatory fat stranding is seen. This is more severe along the adjacent portions of the stomach, proximal duodenum and colon. There is no evidence of pancreatic ductal dilatation. Spleen: Normal in size without focal abnormality. Adrenals/Urinary Tract: Adrenal glands are unremarkable. Kidneys are normal, without renal calculi, focal lesion, or hydronephrosis. Bladder is unremarkable. Stomach/Bowel: Mild posterior gastric wall thickening is seen in the region of the gastric body. Appendix appears normal. No evidence of bowel dilatation. Previously noted moderate to marked severity areas of perigastric, peri-duodenal and pericolonic inflammatory fat stranding are seen. Small, ill-defined areas of heterogeneous low attenuation are seen involving the medial wall of the proximal duodenum (axial CT images 39 through 44, CT series 2). No intra-abdominal free air is identified. Vascular/Lymphatic: Aortic atherosclerosis. No enlarged abdominal or pelvic lymph nodes. Reproductive: Status post hysterectomy. No adnexal masses. Other: No abdominal wall hernia or abnormality. A small amount of posterior pelvic free fluid is seen (approximately 17.52 Hounsfield units). Musculoskeletal: No acute or significant osseous findings. IMPRESSION: 1. Findings consistent with acute pancreatitis with adjacent, moderate to marked severity gastritis and duodenitis. Correlation with pancreatic enzymes is recommended. 2. Low-attenuation areas involving the wall of the proximal duodenum which may represent areas of duodenal ulceration. GI consult and subsequent endoscopy is recommended. 3. Small amount of posterior pelvic free fluid. 4. Hepatic steatosis. 5. Aortic atherosclerosis. Aortic Atherosclerosis (ICD10-I70.0). Electronically Signed   By: Aram Candela M.D.   On: 12/16/2023 02:07   DG Chest 2 View Result Date:  12/15/2023 CLINICAL DATA:  Chest and abdominal pain EXAM: CHEST - 2 VIEW COMPARISON:  09/04/2018 FINDINGS: The heart size and mediastinal contours are within normal limits. Both lungs are clear. The visualized skeletal structures are unremarkable. IMPRESSION: No active cardiopulmonary disease. Electronically Signed   By: Alcide Clever M.D.   On: 12/15/2023 17:23       Impression / Plan:   57 y/o lady with history of hypertension, alcohol use, and tobacco abuse here with acute pancreatitis 2/2 alcohol and imaging suggesting possible duodenal ulceration. Hemoglobin stable and no melena. Will recommend continued treatment of pancreatitis and will consider EGD tomorrow.  - will advance diet to clear liquids, NPO at midnight - recommend increasing fluid rate to at least 100-125 ml/hr - pain control - will increase PPI to BID - needs alcohol cessation counseling  Please call with any questions or concerns.  Merlyn Lot MD, MPH [facility] GI

## 2023-12-16 NOTE — Assessment & Plan Note (Signed)
Likely related to pain Pain control IV hydralazine as needed for BP control while unable to tolerate orally

## 2023-12-17 ENCOUNTER — Encounter
Admission: EM | Disposition: A | Discharge status: Home / Self Care | Payer: Self-pay | Source: Home / Self Care | Attending: Internal Medicine

## 2023-12-17 ENCOUNTER — Inpatient Hospital Stay: Payer: Self-pay | Admitting: Anesthesiology

## 2023-12-17 DIAGNOSIS — E66812 Obesity, class 2: Secondary | ICD-10-CM

## 2023-12-17 DIAGNOSIS — Z6837 Body mass index (BMI) 37.0-37.9, adult: Secondary | ICD-10-CM

## 2023-12-17 DIAGNOSIS — E6609 Other obesity due to excess calories: Secondary | ICD-10-CM

## 2023-12-17 DIAGNOSIS — F1090 Alcohol use, unspecified, uncomplicated: Secondary | ICD-10-CM | POA: Diagnosis not present

## 2023-12-17 DIAGNOSIS — K852 Alcohol induced acute pancreatitis without necrosis or infection: Secondary | ICD-10-CM | POA: Diagnosis not present

## 2023-12-17 DIAGNOSIS — I160 Hypertensive urgency: Secondary | ICD-10-CM | POA: Diagnosis not present

## 2023-12-17 HISTORY — PX: ESOPHAGOGASTRODUODENOSCOPY (EGD) WITH PROPOFOL: SHX5813

## 2023-12-17 LAB — CBC
HCT: 44.1 % (ref 36.0–46.0)
Hemoglobin: 16 g/dL — ABNORMAL HIGH (ref 12.0–15.0)
MCH: 30.9 pg (ref 26.0–34.0)
MCHC: 36.3 g/dL — ABNORMAL HIGH (ref 30.0–36.0)
MCV: 85.3 fL (ref 80.0–100.0)
Platelets: 273 10*3/uL (ref 150–400)
RBC: 5.17 MIL/uL — ABNORMAL HIGH (ref 3.87–5.11)
RDW: 13 % (ref 11.5–15.5)
WBC: 16.5 10*3/uL — ABNORMAL HIGH (ref 4.0–10.5)
nRBC: 0 % (ref 0.0–0.2)

## 2023-12-17 LAB — BASIC METABOLIC PANEL
Anion gap: 12 (ref 5–15)
BUN: 11 mg/dL (ref 6–20)
CO2: 23 mmol/L (ref 22–32)
Calcium: 8.7 mg/dL — ABNORMAL LOW (ref 8.9–10.3)
Chloride: 99 mmol/L (ref 98–111)
Creatinine, Ser: 0.47 mg/dL (ref 0.44–1.00)
GFR, Estimated: >60 mL/min (ref >60)
Glucose, Bld: 117 mg/dL — ABNORMAL HIGH (ref 70–99)
Potassium: 3.3 mmol/L — ABNORMAL LOW (ref 3.5–5.1)
Sodium: 134 mmol/L — ABNORMAL LOW (ref 135–145)

## 2023-12-17 LAB — LIPASE, BLOOD: Lipase: 362 U/L — ABNORMAL HIGH (ref 11–51)

## 2023-12-17 SURGERY — ESOPHAGOGASTRODUODENOSCOPY (EGD) WITH PROPOFOL
Anesthesia: General

## 2023-12-17 MED ORDER — LOSARTAN POTASSIUM-HCTZ 100-25 MG PO TABS: 1.0000 | ORAL_TABLET | Freq: Every day | ORAL | Status: DC

## 2023-12-17 MED ORDER — ROSUVASTATIN CALCIUM 20 MG PO TABS
20.0000 mg | ORAL_TABLET | Freq: Every day | ORAL | Status: DC
  Administered 2023-12-17 – 2023-12-18 (×2): 20 mg via ORAL
  Filled 2023-12-17 (×2): qty 1

## 2023-12-17 MED ORDER — PROPOFOL 10 MG/ML IV BOLUS
INTRAVENOUS | Status: AC
  Filled 2023-12-17: qty 40

## 2023-12-17 MED ORDER — DEXAMETHASONE SODIUM PHOSPHATE 10 MG/ML IJ SOLN
INTRAMUSCULAR | Status: DC | PRN
  Administered 2023-12-17: 10 mg via INTRAVENOUS

## 2023-12-17 MED ORDER — DEXMEDETOMIDINE HCL IN NACL 80 MCG/20ML IV SOLN
INTRAVENOUS | Status: DC | PRN
  Administered 2023-12-17: 12 ug via INTRAVENOUS

## 2023-12-17 MED ORDER — PROPOFOL 10 MG/ML IV BOLUS
INTRAVENOUS | Status: DC | PRN
  Administered 2023-12-17: 80 mg via INTRAVENOUS
  Administered 2023-12-17 (×2): 20 mg via INTRAVENOUS

## 2023-12-17 MED ORDER — DEXMEDETOMIDINE HCL IN NACL 80 MCG/20ML IV SOLN
INTRAVENOUS | Status: AC
  Filled 2023-12-17: qty 20

## 2023-12-17 MED ORDER — HYDROCHLOROTHIAZIDE 25 MG PO TABS
25.0000 mg | ORAL_TABLET | Freq: Every day | ORAL | Status: DC
  Administered 2023-12-17 – 2023-12-18 (×2): 25 mg via ORAL
  Filled 2023-12-17 (×2): qty 1

## 2023-12-17 MED ORDER — LOSARTAN POTASSIUM 50 MG PO TABS
100.0000 mg | ORAL_TABLET | Freq: Every day | ORAL | Status: DC
  Administered 2023-12-17 – 2023-12-18 (×2): 100 mg via ORAL
  Filled 2023-12-17 (×2): qty 2

## 2023-12-17 MED ORDER — METOPROLOL TARTRATE 25 MG PO TABS
25.0000 mg | ORAL_TABLET | Freq: Two times a day (BID) | ORAL | Status: DC
  Administered 2023-12-17 – 2023-12-18 (×3): 25 mg via ORAL
  Filled 2023-12-17 (×3): qty 1

## 2023-12-17 MED ORDER — AMLODIPINE BESYLATE 10 MG PO TABS
10.0000 mg | ORAL_TABLET | Freq: Every day | ORAL | Status: DC
  Administered 2023-12-17 – 2023-12-18 (×2): 10 mg via ORAL
  Filled 2023-12-17 (×2): qty 1

## 2023-12-17 MED ORDER — ONDANSETRON HCL 4 MG/2ML IJ SOLN
INTRAMUSCULAR | Status: DC | PRN
  Administered 2023-12-17: 4 mg via INTRAVENOUS

## 2023-12-17 MED ORDER — SODIUM CHLORIDE 0.9 % IV SOLN: INTRAVENOUS | Status: DC | PRN

## 2023-12-17 NOTE — Op Note (Signed)
[facility] Gastroenterology Patient Name: Stephanie Jacobs Procedure Date: 12/17/2023 7:43 AM MRN: [id number] Account #: [id number] Date of Birth: 20-Apr-1966 Admit Type: Outpatient Age: 57 Room: [facility] ENDO ROOM 4 Gender: Female Note Status: Finalized Instrument Name: Patton Salles Endoscope [id number] Procedure:             Upper GI endoscopy Indications:           Abnormal CT of the GI tract Providers:             Eather Colas MD, MD Medicines:             Monitored Anesthesia Care Complications:         No immediate complications. Procedure:             Pre-Anesthesia Assessment:                        - Prior to the procedure, a History and Physical was                         performed, and patient medications and allergies were                         reviewed. The patient is competent. The risks and                         benefits of the procedure and the sedation options and                         risks were discussed with the patient. All questions                         were answered and informed consent was obtained.                         Patient identification and proposed procedure were                         verified by the physician, the nurse, the                         anesthesiologist, the anesthetist and the technician                         in the endoscopy suite. Mental Status Examination:                         alert and oriented. Airway Examination: normal                         oropharyngeal airway and neck mobility. Respiratory                         Examination: clear to auscultation. CV Examination:                         normal. Prophylactic Antibiotics: The patient does not                         require prophylactic antibiotics. Prior  Anticoagulants: The patient has taken no anticoagulant                         or antiplatelet agents. ASA Grade Assessment: III - A                         patient with severe  systemic disease. After reviewing                         the risks and benefits, the patient was deemed in                         satisfactory condition to undergo the procedure. The                         anesthesia plan was to use monitored anesthesia care                         (MAC). Immediately prior to administration of                         medications, the patient was re-assessed for adequacy                         to receive sedatives. The heart rate, respiratory                         rate, oxygen saturations, blood pressure, adequacy of                         pulmonary ventilation, and response to care were                         monitored throughout the procedure. The physical                         status of the patient was re-assessed after the                         procedure.                        After obtaining informed consent, the endoscope was                         passed under direct vision. Throughout the procedure,                         the patient's blood pressure, pulse, and oxygen                         saturations were monitored continuously. The Endoscope                         was introduced through the mouth, and advanced to the                         second part of duodenum. The upper GI endoscopy was  accomplished without difficulty. The patient tolerated                         the procedure well. Findings:      The examined esophagus was normal.      The entire examined stomach was normal.      The examined duodenum was normal. Impression:            - Normal esophagus.                        - Normal stomach.                        - Normal examined duodenum.                        - No specimens collected. Recommendation:        - Return patient to hospital ward for ongoing care.                        - Advance diet as tolerated.                        - Continue present medications.                         - Can d/c PPI and start oral if needed Procedure Code(s):     --- Professional ---                        [phone], Esophagogastroduodenoscopy, flexible,                         transoral; diagnostic, including collection of                         specimen(s) by brushing or washing, when performed                         (separate procedure) Diagnosis Code(s):     --- Professional ---                        R93.3, Abnormal findings on diagnostic imaging of                         other parts of digestive tract CPT copyright 2022 [employer]. All rights reserved. The codes documented in this report are preliminary and upon coder review may  be revised to meet current compliance requirements. Eather Colas MD, MD 12/17/2023 8:23:39 AM Number of Addenda: 0 Note Initiated On: 12/17/2023 7:43 AM Estimated Blood Loss:  Estimated blood loss: none.      [facility]

## 2023-12-17 NOTE — Care Plan (Signed)
EGD was normal. Recommend advancing diet and seeing how she tolerates it before making any decision regarding dispo. Would also consider further monitoring for any signs/symptoms of withdrawal.  Merlyn Lot MD, MPH [facility] GI

## 2023-12-17 NOTE — Progress Notes (Signed)
Progress Note   Patient: Stephanie Jacobs XBM:[id number] DOB: [birth year] DOA: 12/16/2023     1 DOS: the patient was seen and examined on 12/17/2023   Brief hospital course:  LASHEENA NORDHOFF is a 57 y.o. female with medical history significant for Hypertension and alcohol use disorder who presents to the ED with a several hour history of abdominal pain and vomiting.   Lipase is low at 50, CT abdomen pelvis consistent with acute pancreatitis moderate to marked to severe gastritis from duodenitis with possible ulceration of proximal duodenum.  Patient is admitted to the hospitalist service for acute pancreatitis.  She had EGD today morning, unremarkable findings.  Patient is started on clears plan to gradually advance her diet as tolerated  Assessment and Plan: * Acute alcoholic pancreatitis Severe gastritis and duodenitis Possible duodenal ulceration on CT scan Patient is able to tolerate clears. Status post EGD which did showed normal esophagus, stomach or duodenum. GI follow-up appreciated, DC PPI Clear liquid diet and advance diet as tolerated. Continue pain control.  Hypertensive urgency Likely related to pain Continue pain control. Her home medications including amlodipine, losartan hydrochlorothiazide, metoprolol restarted. IV hydralazine as needed for elevated BP.  Alcohol use disorder CIWA withdrawal protocol. No withdrawal symptoms. Advised to stop drinking alcohol.  Obesity with BMI 37.2 Diet, exercise and weight reduction advised.      Out of bed to chair. Incentive spirometry. Nursing supportive care. Fall, aspiration precautions. DVT prophylaxis   Code Status: Full Code Subjective: Patient is seen and examined today morning after EGD procedure. She is lying in bed, denies any abdominal discomfort.  She wished to eat.  No nausea.  Physical Exam: Vitals:   12/17/23 0740 12/17/23 0820 12/17/23 0829 12/17/23 0839  BP: (!) 165/104 (!) 134/90 (!) 150/103 (!)  165/106  Pulse: (!) 111 98 (!) 101 99  Resp: 20 16 20 18   Temp: 98.9 F (37.2 C) 98.5 F (36.9 C)    TempSrc: Temporal Temporal    SpO2: 94% 95% 93% 93%  Weight:      Height:        General - Middle aged African American obese female, no apparent distress HEENT - PERRLA, EOMI, atraumatic head, non tender sinuses. Lung - Clear, no rales, rhonchi, wheezes. Heart - S1, S2 heard, no murmurs, rubs, trace pedal edema. Abdomen - Soft, non tender, obese, bowel sounds good Neuro - Alert, awake and oriented x 3, non focal exam. Skin - Warm and dry.  Data Reviewed:      Latest Ref Rng & Units 12/17/2023    4:26 AM 12/15/2023    4:17 PM 03/17/2023   10:36 AM  CBC  WBC 4.0 - 10.5 K/uL 16.5  11.7  10.6   Hemoglobin 12.0 - 15.0 g/dL 02.7  25.3  66.4   Hematocrit 36.0 - 46.0 % 44.1  46.4  42.2   Platelets 150 - 400 K/uL 273  295  263       Latest Ref Rng & Units 12/17/2023    4:26 AM 12/15/2023    4:17 PM 09/03/2022   12:39 PM  BMP  Glucose 70 - 99 mg/dL 403  474  259   BUN 6 - 20 mg/dL 11  8  11    Creatinine 0.44 - 1.00 mg/dL 5.63  8.75  6.43   Sodium 135 - 145 mmol/L 134  134  137   Potassium 3.5 - 5.1 mmol/L 3.3  3.6  3.6   Chloride 98 - 111 mmol/L  99  99  100   CO2 22 - 32 mmol/L 23  19  28    Calcium 8.9 - 10.3 mg/dL 8.7  9.0  9.4    CT ABDOMEN PELVIS W CONTRAST Result Date: 12/16/2023 CLINICAL DATA:  Chest pain and vomiting. EXAM: CT ABDOMEN AND PELVIS WITH CONTRAST TECHNIQUE: Multidetector CT imaging of the abdomen and pelvis was performed using the standard protocol following bolus administration of intravenous contrast. RADIATION DOSE REDUCTION: This exam was performed according to the departmental dose-optimization program which includes automated exposure control, adjustment of the mA and/or kV according to patient size and/or use of iterative reconstruction technique. CONTRAST:  OMNIPAQUE IOHEXOL 350 MG/ML SOLN COMPARISON:  None Available. FINDINGS: Lower chest: Mild  atelectatic changes are seen within the bilateral lung bases. Hepatobiliary: There is diffuse fatty infiltration of the liver parenchyma. A 7 mm focus of parenchymal low attenuation is seen within the posterior aspect of the right lobe of the liver. No gallstones, gallbladder wall thickening, or biliary dilatation. Pancreas: Mild to moderate severity peripancreatic inflammatory fat stranding is seen. This is more severe along the adjacent portions of the stomach, proximal duodenum and colon. There is no evidence of pancreatic ductal dilatation. Spleen: Normal in size without focal abnormality. Adrenals/Urinary Tract: Adrenal glands are unremarkable. Kidneys are normal, without renal calculi, focal lesion, or hydronephrosis. Bladder is unremarkable. Stomach/Bowel: Mild posterior gastric wall thickening is seen in the region of the gastric body. Appendix appears normal. No evidence of bowel dilatation. Previously noted moderate to marked severity areas of perigastric, peri-duodenal and pericolonic inflammatory fat stranding are seen. Small, ill-defined areas of heterogeneous low attenuation are seen involving the medial wall of the proximal duodenum (axial CT images 39 through 44, CT series 2). No intra-abdominal free air is identified. Vascular/Lymphatic: Aortic atherosclerosis. No enlarged abdominal or pelvic lymph nodes. Reproductive: Status post hysterectomy. No adnexal masses. Other: No abdominal wall hernia or abnormality. A small amount of posterior pelvic free fluid is seen (approximately 17.52 Hounsfield units). Musculoskeletal: No acute or significant osseous findings. IMPRESSION: 1. Findings consistent with acute pancreatitis with adjacent, moderate to marked severity gastritis and duodenitis. Correlation with pancreatic enzymes is recommended. 2. Low-attenuation areas involving the wall of the proximal duodenum which may represent areas of duodenal ulceration. GI consult and subsequent endoscopy is  recommended. 3. Small amount of posterior pelvic free fluid. 4. Hepatic steatosis. 5. Aortic atherosclerosis. Aortic Atherosclerosis (ICD10-I70.0). Electronically Signed   By: Aram Candela M.D.   On: 12/16/2023 02:07   DG Chest 2 View Result Date: 12/15/2023 CLINICAL DATA:  Chest and abdominal pain EXAM: CHEST - 2 VIEW COMPARISON:  09/04/2018 FINDINGS: The heart size and mediastinal contours are within normal limits. Both lungs are clear. The visualized skeletal structures are unremarkable. IMPRESSION: No active cardiopulmonary disease. Electronically Signed   By: Alcide Clever M.D.   On: 12/15/2023 17:23    Family Communication: Discussed with patient, she understand and agree. All questions answereed.  Disposition: Status is: Inpatient Remains inpatient appropriate because: pancreatitis, advance diet  Planned Discharge Destination: Home     Time spent: 37 minutes  Author: Marcelino Duster, MD 12/17/2023 1:17 PM Secure chat 7am to 7pm For on call review [email or web address].

## 2023-12-17 NOTE — Discharge Instructions (Signed)
[facility] [address] [place], [place] [address] [phone]  Monday & Wednesday 8am, walk-in intake appointments on first come, first serve basis

## 2023-12-17 NOTE — Anesthesia Postprocedure Evaluation (Signed)
Anesthesia Post Note  Patient: Stephanie Jacobs  Procedure(s) Performed: ESOPHAGOGASTRODUODENOSCOPY (EGD) WITH PROPOFOL  Patient location during evaluation: PACU Anesthesia Type: General Level of consciousness: awake and alert Pain management: pain level controlled Vital Signs Assessment: post-procedure vital signs reviewed and stable Respiratory status: spontaneous breathing, nonlabored ventilation and respiratory function stable Cardiovascular status: blood pressure returned to baseline and stable Postop Assessment: no apparent nausea or vomiting Anesthetic complications: no   No notable events documented.   Last Vitals:  Vitals:   12/17/23 0829 12/17/23 0839  BP: (!) 150/103 (!) 165/106  Pulse: (!) 101 99  Resp: 20 18  Temp:    SpO2: 93% 93%    Last Pain:  Vitals:   12/17/23 0839  TempSrc:   PainSc: 0-No pain                 Foye Deer

## 2023-12-17 NOTE — Transfer of Care (Signed)
Immediate Anesthesia Transfer of Care Note  Patient: Stephanie Jacobs  Procedure(s) Performed: ESOPHAGOGASTRODUODENOSCOPY (EGD) WITH PROPOFOL  Patient Location: PACU and Endoscopy Unit  Anesthesia Type:General  Level of Consciousness: drowsy and patient cooperative  Airway & Oxygen Therapy: Patient Spontanous Breathing and Patient connected to nasal cannula oxygen  Post-op Assessment: Report given to RN and Post -op Vital signs reviewed and stable  Post vital signs: Reviewed and stable  Last Vitals:  Vitals Value Taken Time  BP 134/90 12/17/23 0820  Temp    Pulse    Resp 16 12/17/23 0820  SpO2 95 % 12/17/23 0820    Last Pain:  Vitals:   12/17/23 0740  TempSrc: Temporal  PainSc:          Complications: No notable events documented.

## 2023-12-17 NOTE — Plan of Care (Signed)
  Problem: Education: Goal: Knowledge of General Education information will improve Description: Including pain rating scale, medication(s)/side effects and non-pharmacologic comfort measures Outcome: Progressing   Problem: Clinical Measurements: Goal: Ability to maintain clinical measurements within normal limits will improve Outcome: Progressing Goal: Will remain free from infection Outcome: Progressing Goal: Diagnostic test results will improve Outcome: Progressing Goal: Respiratory complications will improve Outcome: Progressing Goal: Cardiovascular complication will be avoided Outcome: Progressing   Problem: Activity: Goal: Risk for activity intolerance will decrease Outcome: Progressing   Problem: Pain Management: Goal: General experience of comfort will improve Outcome: Progressing   Problem: Safety: Goal: Ability to remain free from injury will improve Outcome: Progressing   Problem: Skin Integrity: Goal: Risk for impaired skin integrity will decrease Outcome: Progressing

## 2023-12-17 NOTE — Plan of Care (Signed)
  Problem: Education: Goal: Knowledge of General Education information will improve Description: Including pain rating scale, medication(s)/side effects and non-pharmacologic comfort measures Outcome: Progressing   Problem: Clinical Measurements: Goal: Ability to maintain clinical measurements within normal limits will improve Outcome: Progressing Goal: Will remain free from infection Outcome: Progressing Goal: Diagnostic test results will improve Outcome: Progressing Goal: Respiratory complications will improve Outcome: Progressing Goal: Cardiovascular complication will be avoided Outcome: Progressing   Problem: Activity: Goal: Risk for activity intolerance will decrease Outcome: Progressing   Problem: Pain Management: Goal: General experience of comfort will improve Outcome: Progressing   Problem: Safety: Goal: Ability to remain free from injury will improve Outcome: Progressing

## 2023-12-18 DIAGNOSIS — E669 Obesity, unspecified: Secondary | ICD-10-CM

## 2023-12-18 DIAGNOSIS — K852 Alcohol induced acute pancreatitis without necrosis or infection: Secondary | ICD-10-CM | POA: Diagnosis not present

## 2023-12-18 DIAGNOSIS — F1090 Alcohol use, unspecified, uncomplicated: Secondary | ICD-10-CM | POA: Diagnosis not present

## 2023-12-18 DIAGNOSIS — I160 Hypertensive urgency: Secondary | ICD-10-CM | POA: Diagnosis not present

## 2023-12-18 LAB — BASIC METABOLIC PANEL
Anion gap: 9 (ref 5–15)
BUN: 12 mg/dL (ref 6–20)
CO2: 28 mmol/L (ref 22–32)
Calcium: 8.9 mg/dL (ref 8.9–10.3)
Chloride: 97 mmol/L — ABNORMAL LOW (ref 98–111)
Creatinine, Ser: 0.66 mg/dL (ref 0.44–1.00)
GFR, Estimated: >60 mL/min (ref >60)
Glucose, Bld: 108 mg/dL — ABNORMAL HIGH (ref 70–99)
Potassium: 3.4 mmol/L — ABNORMAL LOW (ref 3.5–5.1)
Sodium: 134 mmol/L — ABNORMAL LOW (ref 135–145)

## 2023-12-18 LAB — CBC
HCT: 41.3 % (ref 36.0–46.0)
Hemoglobin: 14.9 g/dL (ref 12.0–15.0)
MCH: 31 pg (ref 26.0–34.0)
MCHC: 36.1 g/dL — ABNORMAL HIGH (ref 30.0–36.0)
MCV: 86 fL (ref 80.0–100.0)
Platelets: 248 10*3/uL (ref 150–400)
RBC: 4.8 MIL/uL (ref 3.87–5.11)
RDW: 12.8 % (ref 11.5–15.5)
WBC: 17.3 10*3/uL — ABNORMAL HIGH (ref 4.0–10.5)
nRBC: 0 % (ref 0.0–0.2)

## 2023-12-18 MED ORDER — ONDANSETRON 4 MG PO TBDP: 4.0000 mg | ORAL_TABLET | Freq: Three times a day (TID) | ORAL | 0 refills | Status: AC | PRN

## 2023-12-18 MED ORDER — VITAMIN B-1 100 MG PO TABS: 100.0000 mg | ORAL_TABLET | Freq: Every day | ORAL | 1 refills | Status: AC

## 2023-12-18 MED ORDER — FOLIC ACID 1 MG PO TABS: 1.0000 mg | ORAL_TABLET | Freq: Every day | ORAL | 1 refills | Status: AC

## 2023-12-18 NOTE — Plan of Care (Addendum)
Patient is alert and oriented X 4. Discharge instruction given . All questions are addressed. No any questions or concern at this time. Peripheral I/v removed.    Problem: Education: Goal: Knowledge of General Education information will improve Description: Including pain rating scale, medication(s)/side effects and non-pharmacologic comfort measures Outcome: Completed/Met   Problem: Health Behavior/Discharge Planning: Goal: Ability to manage health-related needs will improve Outcome: Completed/Met   Problem: Clinical Measurements: Goal: Ability to maintain clinical measurements within normal limits will improve Outcome: Completed/Met Goal: Will remain free from infection Outcome: Completed/Met Goal: Diagnostic test results will improve Outcome: Completed/Met Goal: Respiratory complications will improve Outcome: Completed/Met Goal: Cardiovascular complication will be avoided Outcome: Completed/Met   Problem: Activity: Goal: Risk for activity intolerance will decrease Outcome: Completed/Met   Problem: Nutrition: Goal: Adequate nutrition will be maintained Outcome: Completed/Met   Problem: Coping: Goal: Level of anxiety will decrease Outcome: Completed/Met   Problem: Elimination: Goal: Will not experience complications related to bowel motility Outcome: Completed/Met Goal: Will not experience complications related to urinary retention Outcome: Completed/Met   Problem: Pain Management: Goal: General experience of comfort will improve Outcome: Completed/Met   Problem: Safety: Goal: Ability to remain free from injury will improve Outcome: Completed/Met   Problem: Skin Integrity: Goal: Risk for impaired skin integrity will decrease Outcome: Completed/Met

## 2023-12-18 NOTE — Discharge Summary (Signed)
Physician Discharge Summary   Patient: Stephanie Jacobs MRN: [id number] DOB: [birth year]  Admit date:     12/16/2023  Discharge date: 12/18/23  Discharge Physician: Marcelino Duster   PCP: Jacky Kindle, FNP   Recommendations at discharge:    PCP follow up in 1 week  Discharge Diagnoses: Principal Problem:   Acute alcoholic pancreatitis Active Problems:   Alcohol use disorder   Hypertensive urgency  Resolved Problems:   * No resolved hospital problems. *  Hospital Course: Stephanie Jacobs is a 57 y.o. female with medical history significant for Hypertension and alcohol use disorder who presents to the ED with a several hour history of abdominal pain and vomiting.    Lipase is low at 50, CT abdomen pelvis consistent with acute pancreatitis moderate to marked to severe gastritis from duodenitis with possible ulceration of proximal duodenum.  Patient is admitted to the hospitalist service for acute pancreatitis.  She had EGD 12/17/23 with unremarkable findings.  Patient is started on clears plan to gradually advance her diet as tolerated. Her pain, nausea better is able to tolerate diet. She is hemodynamically stable to be discharged home. Advised to quit alcohol, loose weight, follow up with PCP in 1 week. She understands and agrees with discharge plan.  Assessment and Plan: Acute alcoholic pancreatitis Severe gastritis and duodenitis, Possible duodenal ulceration on CT scan. EGD negative for any findings. Patient is able to tolerate diet. Status post EGD which did showed normal esophagus, stomach or duodenum. GI follow-up appreciated, DC PPI   Hypertensive urgency Likely related to pain Continue pain control. Her home medications including amlodipine, losartan hydrochlorothiazide, metoprolol restarted.   Alcohol use disorder CIWA withdrawal protocol. No withdrawal symptoms. Advised to stop drinking alcohol. Thiamine, folate and MVT    Obesity with BMI 37.2 Diet,  exercise and weight reduction advised.         Consultants: GI Procedures performed: EGD  Disposition: Home Diet recommendation:  Discharge Diet Orders (From admission, onward)     Start     Ordered   12/18/23 0000  Diet - low sodium heart healthy        12/18/23 1058           Cardiac diet DISCHARGE MEDICATION: Allergies as of 12/18/2023       Reactions   Tape Rash        Medication List     TAKE these medications    amLODipine 10 MG tablet Commonly known as: NORVASC Take 1 tablet (10 mg total) by mouth daily.   folic acid 1 MG tablet Commonly known as: FOLVITE Take 1 tablet (1 mg total) by mouth daily. Start taking on: December 19, 2023   lactulose 10 GM/15ML solution Commonly known as: CHRONULAC Take 15 mLs (10 g total) by mouth daily as needed for mild constipation.   losartan-hydrochlorothiazide 100-25 MG tablet Commonly known as: HYZAAR Take 1 tablet by mouth daily.   metoprolol tartrate 25 MG tablet Commonly known as: LOPRESSOR TAKE 1 TABLET TWICE DAILY   multivitamin tablet Take 1 tablet by mouth daily.   ondansetron 4 MG disintegrating tablet Commonly known as: ZOFRAN-ODT Take 1 tablet (4 mg total) by mouth every 8 (eight) hours as needed for nausea or vomiting.   promethazine-dextromethorphan 6.25-15 MG/5ML syrup Commonly known as: PROMETHAZINE-DM Take 5 mLs by mouth every 4 (four) hours as needed.   rosuvastatin 20 MG tablet Commonly known as: Crestor Take 1 tablet (20 mg total) by mouth daily.   thiamine  100 MG tablet Commonly known as: Vitamin B-1 Take 1 tablet (100 mg total) by mouth daily. Start taking on: December 19, 2023   valACYclovir 1000 MG tablet Commonly known as: VALTREX Take 1 tablet (1,000 mg total) by mouth 2 (two) times daily. Use one tablet, twice daily for flares; 3-7 days typically course        Follow-up Information     Jacky Kindle, FNP Follow up.   Specialty: Family Medicine Why: Hospital  follow up Contact information: [address] [place] [address] [phone]                Discharge Exam: Ceasar Mons Weights   12/15/23 1614  Weight: 95.3 kg   General - Middle aged African American obese female, no apparent distress HEENT - PERRLA, EOMI, atraumatic head, non tender sinuses. Lung - Clear, no rales, rhonchi, wheezes. Heart - S1, S2 heard, no murmurs, rubs, trace pedal edema. Abdomen - Soft, non tender, obese, bowel sounds good Neuro - Alert, awake and oriented x 3, non focal exam. Skin - Warm and dry.  Condition at discharge: stable  The results of significant diagnostics from this hospitalization (including imaging, microbiology, ancillary and laboratory) are listed below for reference.   Imaging Studies: CT ABDOMEN PELVIS W CONTRAST Result Date: 12/16/2023 CLINICAL DATA:  Chest pain and vomiting. EXAM: CT ABDOMEN AND PELVIS WITH CONTRAST TECHNIQUE: Multidetector CT imaging of the abdomen and pelvis was performed using the standard protocol following bolus administration of intravenous contrast. RADIATION DOSE REDUCTION: This exam was performed according to the departmental dose-optimization program which includes automated exposure control, adjustment of the mA and/or kV according to patient size and/or use of iterative reconstruction technique. CONTRAST:  OMNIPAQUE IOHEXOL 350 MG/ML SOLN COMPARISON:  None Available. FINDINGS: Lower chest: Mild atelectatic changes are seen within the bilateral lung bases. Hepatobiliary: There is diffuse fatty infiltration of the liver parenchyma. A 7 mm focus of parenchymal low attenuation is seen within the posterior aspect of the right lobe of the liver. No gallstones, gallbladder wall thickening, or biliary dilatation. Pancreas: Mild to moderate severity peripancreatic inflammatory fat stranding is seen. This is more severe along the adjacent portions of the stomach, proximal duodenum and colon. There is no evidence  of pancreatic ductal dilatation. Spleen: Normal in size without focal abnormality. Adrenals/Urinary Tract: Adrenal glands are unremarkable. Kidneys are normal, without renal calculi, focal lesion, or hydronephrosis. Bladder is unremarkable. Stomach/Bowel: Mild posterior gastric wall thickening is seen in the region of the gastric body. Appendix appears normal. No evidence of bowel dilatation. Previously noted moderate to marked severity areas of perigastric, peri-duodenal and pericolonic inflammatory fat stranding are seen. Small, ill-defined areas of heterogeneous low attenuation are seen involving the medial wall of the proximal duodenum (axial CT images 39 through 44, CT series 2). No intra-abdominal free air is identified. Vascular/Lymphatic: Aortic atherosclerosis. No enlarged abdominal or pelvic lymph nodes. Reproductive: Status post hysterectomy. No adnexal masses. Other: No abdominal wall hernia or abnormality. A small amount of posterior pelvic free fluid is seen (approximately 17.52 Hounsfield units). Musculoskeletal: No acute or significant osseous findings. IMPRESSION: 1. Findings consistent with acute pancreatitis with adjacent, moderate to marked severity gastritis and duodenitis. Correlation with pancreatic enzymes is recommended. 2. Low-attenuation areas involving the wall of the proximal duodenum which may represent areas of duodenal ulceration. GI consult and subsequent endoscopy is recommended. 3. Small amount of posterior pelvic free fluid. 4. Hepatic steatosis. 5. Aortic atherosclerosis. Aortic Atherosclerosis (ICD10-I70.0). Electronically Signed  By: Aram Candela M.D.   On: 12/16/2023 02:07   DG Chest 2 View Result Date: 12/15/2023 CLINICAL DATA:  Chest and abdominal pain EXAM: CHEST - 2 VIEW COMPARISON:  09/04/2018 FINDINGS: The heart size and mediastinal contours are within normal limits. Both lungs are clear. The visualized skeletal structures are unremarkable. IMPRESSION: No  active cardiopulmonary disease. Electronically Signed   By: Alcide Clever M.D.   On: 12/15/2023 17:23    Microbiology: Results for orders placed or performed during the hospital encounter of 08/19/20  SARS CORONAVIRUS 2 (TAT 6-24 HRS) Nasopharyngeal Nasopharyngeal Swab     Status: None   Collection Time: 08/19/20 10:05 AM   Specimen: Nasopharyngeal Swab  Result Value Ref Range Status   SARS Coronavirus 2 NEGATIVE NEGATIVE Final    Comment: (NOTE) SARS-CoV-2 target nucleic acids are NOT DETECTED.  The SARS-CoV-2 RNA is generally detectable in upper and lower respiratory specimens during the acute phase of infection. Negative results do not preclude SARS-CoV-2 infection, do not rule out co-infections with other pathogens, and should not be used as the sole basis for treatment or other patient management decisions. Negative results must be combined with clinical observations, patient history, and epidemiological information. The expected result is Negative.  Fact Sheet for Patients: HairSlick.no  Fact Sheet for Healthcare Providers: quierodirigir.com  This test is not yet approved or cleared by the [place] FDA and  has been authorized for detection and/or diagnosis of SARS-CoV-2 by FDA under an Emergency Use Authorization (EUA). This EUA will remain  in effect (meaning this test can be used) for the duration of the COVID-19 declaration under Se ction 564(b)(1) of the Act, 21 U.S.C. section 360bbb-3(b)(1), unless the authorization is terminated or revoked sooner.  Performed at [facility], [address] [place], [place] [address]     Labs: CBC: Recent Labs  Lab 12/15/23 1617 12/17/23 0426 12/18/23 0554  WBC 11.7* 16.5* 17.3*  HGB 16.5* 16.0* 14.9  HCT 46.4* 44.1 41.3  MCV 86.1 85.3 86.0  PLT 295 273 248   Basic Metabolic Panel: Recent Labs  Lab 12/15/23 1617 12/17/23 0426 12/18/23 0554  NA 134*  134* 134*  K 3.6 3.3* 3.4*  CL 99 99 97*  CO2 19* 23 28  GLUCOSE 146* 117* 108*  BUN 8 11 12   CREATININE 0.49 0.47 0.66  CALCIUM 9.0 8.7* 8.9   Liver Function Tests: Recent Labs  Lab 12/15/23 1815  AST 67*  ALT 67*  ALKPHOS 70  BILITOT 1.2*  PROT 8.1  ALBUMIN 4.5   CBG: No results for input(s): "GLUCAP" in the last 168 hours.  Discharge time spent: 35 minutes.  Signed: Marcelino Duster, MD [facility] 12/18/2023

## 2023-12-19 ENCOUNTER — Telehealth: Payer: Self-pay

## 2023-12-19 ENCOUNTER — Encounter: Payer: Self-pay | Admitting: Gastroenterology

## 2023-12-19 NOTE — Transitions of Care (Post Inpatient/ED Visit) (Signed)
   12/19/2023  Name: TEKIRA TABACCO MRN: [id number] DOB: Sep 05, 1966  Today's TOC FU Call Status: Today's TOC FU Call Status:: Unsuccessful Call (1st Attempt) Unsuccessful Call (1st Attempt) Date: 12/19/23  Attempted to reach the patient regarding the most recent Inpatient/ED visit.  Follow Up Plan: Additional outreach attempts will be made to reach the patient to complete the Transitions of Care (Post Inpatient/ED visit) call.   Signature Karena Addison, LPN [facility] Nurse Health Advisor Direct Dial [phone]

## 2023-12-19 NOTE — Transitions of Care (Post Inpatient/ED Visit) (Signed)
12/19/2023  Name: ANGELES Jacobs MRN: [id number] DOB: [birth year]  Today's TOC FU Call Status: Today's TOC FU Call Status:: Successful TOC FU Call Completed Unsuccessful Call (1st Attempt) Date: 12/19/23 [facility] FU Call Complete Date: 12/19/23 Patient's Name and Date of Birth confirmed.  Transition Care Management Follow-up Telephone Call Date of Discharge: 12/18/23 Discharge Facility: [facility]) Type of Discharge: Inpatient Admission How have you been since you were released from the hospital?: Better Any questions or concerns?: No  Items Reviewed: Did you receive and understand the discharge instructions provided?: Yes Medications obtained,verified, and reconciled?: Yes (Medications Reviewed) Any new allergies since your discharge?: No Dietary orders reviewed?: Yes Do you have support at home?: Yes People in Home: parent(s)  Medications Reviewed Today: Medications Reviewed Today     Reviewed by Karena Addison, LPN (Licensed Practical Nurse) on 12/19/23 at 1119  Med List Status: <None>   Medication Order Taking? Sig Documenting Provider Last Dose Status Informant  amLODipine (NORVASC) 10 MG tablet [id number] No Take 1 tablet (10 mg total) by mouth daily. Jacky Kindle, FNP 12/12/2023 Active   folic acid (FOLVITE) 1 MG tablet [id number]  Take 1 tablet (1 mg total) by mouth daily. Marcelino Duster, MD  Active   lactulose (CHRONULAC) 10 GM/15ML solution [id number] No Take 15 mLs (10 g total) by mouth daily as needed for mild constipation. Jacky Kindle, FNP 12/12/2023 Active   losartan-hydrochlorothiazide (HYZAAR) 100-25 MG tablet [id number] No Take 1 tablet by mouth daily. Merita Norton T, FNP 12/12/2023 Active   metoprolol tartrate (LOPRESSOR) 25 MG tablet [id number] No TAKE 1 TABLET TWICE DAILY Jacky Kindle, FNP 12/12/2023 Active   Multiple Vitamin (MULTIVITAMIN) tablet [id number] No Take 1 tablet by mouth daily. [provider] Taking  Active Self  ondansetron (ZOFRAN-ODT) 4 MG disintegrating tablet [id number]  Take 1 tablet (4 mg total) by mouth every 8 (eight) hours as needed for nausea or vomiting. Marcelino Duster, MD  Active   promethazine-dextromethorphan (PROMETHAZINE-DM) 6.25-15 MG/5ML syrup [id number] No Take 5 mLs by mouth every 4 (four) hours as needed. [provider] Past Month Active   rosuvastatin (CRESTOR) 20 MG tablet [id number] No Take 1 tablet (20 mg total) by mouth daily. Jacky Kindle, FNP 12/12/2023 Active   thiamine (VITAMIN B-1) 100 MG tablet [id number]  Take 1 tablet (100 mg total) by mouth daily. Marcelino Duster, MD  Active   valACYclovir (VALTREX) 1000 MG tablet [id number] No Take 1 tablet (1,000 mg total) by mouth 2 (two) times daily. Use one tablet, twice daily for flares; 3-7 days typically course Jacky Kindle, FNP Past Month Active             Home Care and Equipment/Supplies: Were Home Health Services Ordered?: NA Any new equipment or medical supplies ordered?: NA  Functional Questionnaire: Do you need assistance with bathing/showering or dressing?: No Do you need assistance with meal preparation?: No Do you need assistance with eating?: No Do you have difficulty maintaining continence: No Do you need assistance with getting out of bed/getting out of a chair/moving?: No Do you have difficulty managing or taking your medications?: No  Follow up appointments reviewed: PCP Follow-up appointment confirmed?: No (no avail appt sent message to staff to schedule) MD Provider Line Number:[phone] Given: No Specialist Hospital Follow-up appointment confirmed?: NA Do you need transportation to your follow-up appointment?: No Do you understand care options if your condition(s) worsen?: Yes-patient verbalized understanding    SIGNATURE Karena Addison, LPN [facility]  Nurse Health Advisor Direct Dial [phone]

## 2023-12-20 ENCOUNTER — Telehealth: Payer: Self-pay | Admitting: *Deleted

## 2023-12-20 NOTE — Transitions of Care (Post Inpatient/ED Visit) (Signed)
   12/20/2023  Name: Stephanie Jacobs MRN: [id number] DOB: December 01, 1966  Today's TOC FU Call Status: Today's TOC FU Call Status:: Unsuccessful Call (2nd Attempt) Unsuccessful Call (2nd Attempt) Date: 12/20/23  Attempted to reach the patient regarding the most recent Inpatient/ED visit.  Follow Up Plan: Additional outreach attempts will be made to reach the patient to complete the Transitions of Care (Post Inpatient/ED visit) call.   Cathlean Headland BSN RN Population Health- Transition of Care Team.  [facility] [phone]

## 2023-12-20 NOTE — Transitions of Care (Post Inpatient/ED Visit) (Signed)
   12/20/2023  Name: Stephanie Jacobs MRN: [id number] DOB: 1966-02-09  Today's TOC FU Call Status: Today's TOC FU Call Status:: Unsuccessful Call (1st Attempt) Unsuccessful Call (1st Attempt) Date: 12/20/23  Attempted to reach the patient regarding the most recent Inpatient/ED visit.  Follow Up Plan: Additional outreach attempts will be made to reach the patient to complete the Transitions of Care (Post Inpatient/ED visit) call.   Cathlean Headland BSN RN Population Health- Transition of Care Team.  [facility] [phone]

## 2023-12-22 ENCOUNTER — Telehealth: Payer: Self-pay

## 2023-12-22 NOTE — Transitions of Care (Post Inpatient/ED Visit) (Signed)
   12/22/2023  Name: Stephanie Jacobs MRN: [id number] DOB: [birth year]  Today's TOC FU Call Status: Today's TOC FU Call Status:: Unsuccessful Call (3rd Attempt) Unsuccessful Call (3rd Attempt) Date: 12/22/23  Attempted to reach the patient regarding the most recent Inpatient/ED visit.  Follow Up Plan: No further outreach attempts will be made at this time. We have been unable to contact the patient.    Rama Pilling, RN,BSN,CCM RN Care Manager Transitions of Care  [facility]  Direct Phone: [phone] Toll Free: [phone] Fax: [phone]

## 2024-01-02 ENCOUNTER — Telehealth: Payer: Self-pay

## 2024-01-02 ENCOUNTER — Telehealth: Payer: Self-pay | Admitting: Family Medicine

## 2024-01-02 NOTE — Telephone Encounter (Signed)
 [facility] is requesting prescription refill amLODipine (NORVASC) 10 MG tablet  & losartan-hydrochlorothiazide (HYZAAR) 100-25 MG tablet   & rosuvastatin (CRESTOR) 20 MG tablet   & metoprolol tartrate (LOPRESSOR) 25 MG tablet   Please advise

## 2024-01-03 ENCOUNTER — Other Ambulatory Visit: Payer: Self-pay

## 2024-01-03 DIAGNOSIS — E7800 Pure hypercholesterolemia, unspecified: Secondary | ICD-10-CM

## 2024-01-03 DIAGNOSIS — I10 Essential (primary) hypertension: Secondary | ICD-10-CM

## 2024-01-03 MED ORDER — ROSUVASTATIN CALCIUM 20 MG PO TABS: 20.0000 mg | ORAL_TABLET | Freq: Every day | ORAL | 0 refills | Status: DC

## 2024-01-03 MED ORDER — METOPROLOL TARTRATE 25 MG PO TABS: ORAL_TABLET | ORAL | 0 refills | Status: DC

## 2024-01-03 MED ORDER — LOSARTAN POTASSIUM-HCTZ 100-25 MG PO TABS: 1.0000 | ORAL_TABLET | Freq: Every day | ORAL | 0 refills | Status: DC

## 2024-01-03 MED ORDER — AMLODIPINE BESYLATE 10 MG PO TABS: 10.0000 mg | ORAL_TABLET | Freq: Every day | ORAL | 0 refills | Status: DC

## 2024-01-10 ENCOUNTER — Encounter: Payer: Self-pay | Admitting: Family Medicine

## 2024-01-10 ENCOUNTER — Ambulatory Visit (INDEPENDENT_AMBULATORY_CARE_PROVIDER_SITE_OTHER): Payer: BC Managed Care – PPO | Admitting: Family Medicine

## 2024-01-10 VITALS — BP 134/75 | HR 68 | Ht 63.0 in | Wt 202.0 lb

## 2024-01-10 DIAGNOSIS — F1090 Alcohol use, unspecified, uncomplicated: Secondary | ICD-10-CM | POA: Diagnosis not present

## 2024-01-10 DIAGNOSIS — K852 Alcohol induced acute pancreatitis without necrosis or infection: Secondary | ICD-10-CM | POA: Diagnosis not present

## 2024-01-10 DIAGNOSIS — Z720 Tobacco use: Secondary | ICD-10-CM

## 2024-01-10 DIAGNOSIS — Z09 Encounter for follow-up examination after completed treatment for conditions other than malignant neoplasm: Secondary | ICD-10-CM

## 2024-01-10 DIAGNOSIS — I10 Essential (primary) hypertension: Secondary | ICD-10-CM | POA: Diagnosis not present

## 2024-01-10 NOTE — Assessment & Plan Note (Signed)
Chronic Blood pressure well controlled at 134/75. Compliant with medications: amlodipine 10 mg, losartan 100 mg/hydrochlorothiazide 25 mg, and metoprolol 25 mg twice a day. - Continue current antihypertensive medications

## 2024-01-10 NOTE — Progress Notes (Signed)
Established patient visit   Patient: Stephanie Jacobs   DOB: 08-04-1966   58 y.o. Female  MRN: [id number] Visit Date: 01/10/2024  Today's healthcare provider: Ronnald Ramp, MD   Chief Complaint  Patient presents with   Hospitalization Follow-up    Pt stated--feeling much better.   Subjective     HPI     Hospitalization Follow-up    Additional comments: Pt stated--feeling much better.      Last edited by Shelly Bombard, CMA on 01/10/2024 10:49 AM.       Discussed the use of AI scribe software for clinical note transcription with the patient, who gave verbal consent to proceed.  History of Present Illness   The patient, a 58 year old with a history of alcohol consumption, was recently hospitalized for alcohol-induced pancreatitis. The hospital admission occurred on December 27th and the patient was discharged on December 29th of 2024. During the hospital stay, the patient's lipase was elevated at 50 and a CT scan of the abdomen revealed acute pancreatitis, moderate to severe gastritis, and possible duodenal ulceration. An EGD performed during the hospitalization showed unremarkable findings. The patient was advised to cease alcohol consumption, manage weight, and follow up with her primary care provider in one week.  The patient reports feeling much better since the hospitalization and has lost eight pounds. She also reports that her blood pressure is well controlled at 134/75. During the hospitalization, the patient's potassium was 3.4, glucose was 108, sodium was 134, AST and ALT were both 67, bilirubin was 1.2 total, and white blood cell count was 17.  The patient admits to a history of daily alcohol consumption, specifically wine, for the past 30 years. She reports having consumed two to three glasses of wine per night. Since the hospitalization, the patient has abstained from alcohol and reports no symptoms of withdrawal such as shakiness. She expresses a desire to  eventually return to moderate, social drinking, specifically one to two glasses of wine on Fridays.  The patient also reports adding melatonin and Tylenol to her medication regimen for sleep and knee pain, respectively. She is currently taking amlodipine 10mg , losartan/hydrochlorothiazide 100/25mg , metoprolol 25mg  twice daily, Crestor 20mg , thiamine 100mg  daily, and folic acid 1mg  daily. The patient also takes additional supplements including vitamin B12, vitamin C, and black elderberry capsules. She expresses a desire to add vitamin B1 to her regimen.         Past Medical History:  Diagnosis Date   Elevated liver enzymes 08/27/2022   Hypertension    Sickle cell trait (HCC)     Medications: Outpatient Medications Prior to Visit  Medication Sig   amLODipine (NORVASC) 10 MG tablet Take 1 tablet (10 mg total) by mouth daily.   folic acid (FOLVITE) 1 MG tablet Take 1 tablet (1 mg total) by mouth daily.   lactulose (CHRONULAC) 10 GM/15ML solution Take 15 mLs (10 g total) by mouth daily as needed for mild constipation.   losartan-hydrochlorothiazide (HYZAAR) 100-25 MG tablet Take 1 tablet by mouth daily.   metoprolol tartrate (LOPRESSOR) 25 MG tablet TAKE 1 TABLET TWICE DAILY   Multiple Vitamin (MULTIVITAMIN) tablet Take 1 tablet by mouth daily.   ondansetron (ZOFRAN-ODT) 4 MG disintegrating tablet Take 1 tablet (4 mg total) by mouth every 8 (eight) hours as needed for nausea or vomiting.   promethazine-dextromethorphan (PROMETHAZINE-DM) 6.25-15 MG/5ML syrup Take 5 mLs by mouth every 4 (four) hours as needed.   rosuvastatin (CRESTOR) 20 MG tablet Take 1  tablet (20 mg total) by mouth daily.   thiamine (VITAMIN B-1) 100 MG tablet Take 1 tablet (100 mg total) by mouth daily.   valACYclovir (VALTREX) 1000 MG tablet Take 1 tablet (1,000 mg total) by mouth 2 (two) times daily. Use one tablet, twice daily for flares; 3-7 days typically course   No facility-administered medications prior to visit.     Review of Systems  Last CBC Lab Results  Component Value Date   WBC 17.3 (H) 12/18/2023   HGB 14.9 12/18/2023   HCT 41.3 12/18/2023   MCV 86.0 12/18/2023   MCH 31.0 12/18/2023   RDW 12.8 12/18/2023   PLT 248 12/18/2023   Last metabolic panel Lab Results  Component Value Date   GLUCOSE 108 (H) 12/18/2023   NA 134 (L) 12/18/2023   K 3.4 (L) 12/18/2023   CL 97 (L) 12/18/2023   CO2 28 12/18/2023   BUN 12 12/18/2023   CREATININE 0.66 12/18/2023   GFRNONAA >60 12/18/2023   CALCIUM 8.9 12/18/2023   PROT 8.1 12/15/2023   ALBUMIN 4.5 12/15/2023   LABGLOB 3.0 08/20/2022   AGRATIO 1.6 08/20/2022   BILITOT 1.2 (H) 12/15/2023   ALKPHOS 70 12/15/2023   AST 67 (H) 12/15/2023   ALT 67 (H) 12/15/2023   ANIONGAP 9 12/18/2023        Objective    BP 134/75 (BP Location: Right Arm, Patient Position: Sitting, Cuff Size: Normal)   Pulse 68   Ht 5\' 3"  (1.6 m)   Wt 202 lb (91.6 kg)   LMP 07/21/2019 (Exact Date)   SpO2 97%   BMI 35.78 kg/m  BP Readings from Last 3 Encounters:  01/10/24 134/75  12/18/23 (!) 148/96  03/17/23 (!) 143/97   Wt Readings from Last 3 Encounters:  01/10/24 202 lb (91.6 kg)  12/15/23 210 lb (95.3 kg)  10/01/22 203 lb 9.6 oz (92.4 kg)        Physical Exam Vitals reviewed.  Constitutional:      General: She is not in acute distress.    Appearance: Normal appearance. She is not ill-appearing, toxic-appearing or diaphoretic.  Eyes:     Conjunctiva/sclera: Conjunctivae normal.  Cardiovascular:     Rate and Rhythm: Normal rate and regular rhythm.     Pulses: Normal pulses.     Heart sounds: Normal heart sounds. No murmur heard.    No friction rub. No gallop.  Pulmonary:     Effort: Pulmonary effort is normal. No respiratory distress.     Breath sounds: Normal breath sounds. No stridor. No wheezing, rhonchi or rales.  Abdominal:     General: Bowel sounds are normal. There is no distension.     Palpations: Abdomen is soft.     Tenderness:  There is no abdominal tenderness.  Musculoskeletal:     Right lower leg: No edema.     Left lower leg: No edema.  Skin:    Findings: No erythema or rash.  Neurological:     Mental Status: She is alert and oriented to person, place, and time.      No results found for any visits on 01/10/24.  Assessment & Plan     Problem List Items Addressed This Visit       Cardiovascular and Mediastinum   Essential (primary) hypertension - Primary   Chronic Blood pressure well controlled at 134/75. Compliant with medications: amlodipine 10 mg, losartan 100 mg/hydrochlorothiazide 25 mg, and metoprolol 25 mg twice a day. - Continue current antihypertensive medications  Relevant Orders   CMP14+EGFR     Digestive   Acute alcoholic pancreatitis   Admitted on December 16, 2023, and discharged on December 18, 2023, for alcoholic induced pancreatitis. CT abdomen showed acute pancreatitis, moderate to marked severe gastritis, and possible duodenal ulceration. EGD had unremarkable findings. Reports feeling much better and has lost 8 pounds since hospitalization. Abstained from alcohol since Christmas night, managing cravings with tart cherry juice and ginger ale. Liver enzymes elevated (AST and ALT both 67), and white blood cell count high (17). Discussed naltrexone 50 mg daily as an option to reduce alcohol consumption. Advised continued abstinence from alcohol for a few more weeks to allow healing. Explained that liver enzymes should be less than 40 and that naltrexone can help reduce the desire to drink by making alcohol consumption less rewarding. - Order repeat CBC and complete metabolic panel - Discuss naltrexone 50 mg daily for reducing alcohol consumption - Advise continued abstinence from alcohol - Recheck liver enzymes with complete metabolic panel      Relevant Orders   CBC   CMP14+EGFR     Other   Tobacco use (Chronic)   Alcohol use disorder   Chronic  Reports consuming 2-3  glasses of wine nightly for years  Recommended cessation  Offered naltrexone 50mg  daily, pt prefers to hold off at this time  Will recheck CMP due to elevated liver enzymes during hospitalization, CMP ordered  Recommend Thiamine 100mg  daily and folic acid 1mg  daily        Other Visit Diagnoses       Hospital discharge follow-up           Hyperlipidemia On Crestor 20 mg for cholesterol management. - Continue Crestor 20 mg    General Health Maintenance Did not have a physical in 2024. Hospice nurse with sickle cell trait. Following up with Dr. Cathie Hoops in March for elevated RBCs and possible transfusion needs. - Schedule physical in 3 months - Continue current supplements   Return in about 3 months (around 04/09/2024) for CPE.         Ronnald Ramp, MD  [facility] [phone] (phone) [phone] (fax)  [facility]

## 2024-01-10 NOTE — Assessment & Plan Note (Addendum)
Admitted on December 16, 2023, and discharged on December 18, 2023, for alcoholic induced pancreatitis. CT abdomen showed acute pancreatitis, moderate to marked severe gastritis, and possible duodenal ulceration. EGD had unremarkable findings. Reports feeling much better and has lost 8 pounds since hospitalization. Abstained from alcohol since Christmas night, managing cravings with tart cherry juice and ginger ale. Liver enzymes elevated (AST and ALT both 67), and white blood cell count high (17). Discussed naltrexone 50 mg daily as an option to reduce alcohol consumption. Advised continued abstinence from alcohol for a few more weeks to allow healing. Explained that liver enzymes should be less than 40 and that naltrexone can help reduce the desire to drink by making alcohol consumption less rewarding. - Order repeat CBC and complete metabolic panel - Discuss naltrexone 50 mg daily for reducing alcohol consumption - Advise continued abstinence from alcohol - Recheck liver enzymes with complete metabolic panel

## 2024-01-10 NOTE — Assessment & Plan Note (Addendum)
Chronic  Reports consuming 2-3 glasses of wine nightly for years  Recommended cessation  Offered naltrexone 50mg  daily, pt prefers to hold off at this time  Will recheck CMP due to elevated liver enzymes during hospitalization, CMP ordered  Recommend Thiamine 100mg  daily and folic acid 1mg  daily

## 2024-01-11 ENCOUNTER — Encounter: Payer: Self-pay | Admitting: Family Medicine

## 2024-01-11 LAB — CMP14+EGFR
ALT: 35 [IU]/L — ABNORMAL HIGH (ref 0–32)
AST: 45 [IU]/L — ABNORMAL HIGH (ref 0–40)
Albumin: 4.9 g/dL (ref 3.8–4.9)
Alkaline Phosphatase: 87 [IU]/L (ref 44–121)
BUN/Creatinine Ratio: 16 (ref 9–23)
BUN: 10 mg/dL (ref 6–24)
Bilirubin Total: 0.5 mg/dL (ref 0.0–1.2)
CO2: 26 mmol/L (ref 20–29)
Calcium: 10.5 mg/dL — ABNORMAL HIGH (ref 8.7–10.2)
Chloride: 97 mmol/L (ref 96–106)
Creatinine, Ser: 0.61 mg/dL (ref 0.57–1.00)
Globulin, Total: 3.1 g/dL (ref 1.5–4.5)
Glucose: 78 mg/dL (ref 70–99)
Potassium: 4.6 mmol/L (ref 3.5–5.2)
Sodium: 141 mmol/L (ref 134–144)
Total Protein: 8 g/dL (ref 6.0–8.5)
eGFR: 106 mL/min/{1.73_m2} (ref >59)

## 2024-01-11 LAB — CBC
Hematocrit: 47.1 % — ABNORMAL HIGH (ref 34.0–46.6)
Hemoglobin: 16 g/dL — ABNORMAL HIGH (ref 11.1–15.9)
MCH: 30.1 pg (ref 26.6–33.0)
MCHC: 34 g/dL (ref 31.5–35.7)
MCV: 89 fL (ref 79–97)
Platelets: 368 10*3/uL (ref 150–450)
RBC: 5.31 x10E6/uL — ABNORMAL HIGH (ref 3.77–5.28)
RDW: 12.1 % (ref 11.7–15.4)
WBC: 7.9 10*3/uL (ref 3.4–10.8)

## 2024-02-06 ENCOUNTER — Other Ambulatory Visit: Payer: Self-pay | Admitting: Family Medicine

## 2024-02-06 DIAGNOSIS — E7800 Pure hypercholesterolemia, unspecified: Secondary | ICD-10-CM

## 2024-02-06 DIAGNOSIS — I10 Essential (primary) hypertension: Secondary | ICD-10-CM

## 2024-02-07 NOTE — Telephone Encounter (Signed)
Requested Prescriptions  Pending Prescriptions Disp Refills   rosuvastatin (CRESTOR) 20 MG tablet [Pharmacy Med Name: ROSUVASTATIN CALCIUM 20 MG TAB] 30 tablet 0    Sig: TAKE 1 TABLET BY MOUTH EVERY DAY     Cardiovascular:  Antilipid - Statins 2 Failed - 02/07/2024  2:19 PM      Failed - Lipid Panel in normal range within the last 12 months    Cholesterol, Total  Date Value Ref Range Status  08/20/2022 204 (H) 100 - 199 mg/dL Final   LDL Chol Calc (NIH)  Date Value Ref Range Status  08/20/2022 128 (H) 0 - 99 mg/dL Final   HDL  Date Value Ref Range Status  08/20/2022 51 >39 mg/dL Final   Triglycerides  Date Value Ref Range Status  08/20/2022 139 0 - 149 mg/dL Final         Passed - Cr in normal range and within 360 days    Creatinine  Date Value Ref Range Status  03/05/2012 0.79 0.60 - 1.30 mg/dL Final   Creatinine, Ser  Date Value Ref Range Status  01/10/2024 0.61 0.57 - 1.00 mg/dL Final         Passed - Patient is not pregnant      Passed - Valid encounter within last 12 months    Recent Outpatient Visits           4 weeks ago Essential (primary) hypertension   [place] [facility] Simmons-Robinson, Tawanna Cooler, MD   1 year ago Annual physical exam   [place] [facility] Merita Norton T, FNP   2 years ago Essential (primary) hypertension   [place] [facility] Merita Norton T, FNP   2 years ago Encounter for annual physical exam   [facility], Marzella Schlein, MD   2 years ago Viral URI   [facility] [place], Alessandra [place]vels, [place]       Future Appointments             In 2 months Simmons-Robinson, Tawanna Cooler, MD [facility]             metoprolol tartrate (LOPRESSOR) 25 MG tablet [Pharmacy Med Name: METOPROLOL TARTRATE 25 MG TAB] 180 tablet 0    Sig: TAKE 1 TABLET BY MOUTH TWICE A DAY     Cardiovascular:  Beta Blockers  Passed - 02/07/2024  2:19 PM      Passed - Last BP in normal range    BP Readings from Last 1 Encounters:  01/10/24 134/75         Passed - Last Heart Rate in normal range    Pulse Readings from Last 1 Encounters:  01/10/24 68         Passed - Valid encounter within last 6 months    Recent Outpatient Visits           4 weeks ago Essential (primary) hypertension   [place] [facility] Simmons-Robinson, [place], MD   1 year ago Annual physical exam   [place] [facility] Merita Norton T, FNP   2 years ago Essential (primary) hypertension   [place] [facility] Merita Norton T, FNP   2 years ago Encounter for annual physical exam   [facility], Marzella Schlein, MD   2 years ago Viral URI   [facility] [place], [place], [place]  Future Appointments             In 2 months Simmons-Robinson, [place], MD [facility]             losartan-hydrochlorothiazide (HYZAAR) 100-25 MG tablet [Pharmacy Med Name: LOSARTAN-HCTZ 100-25 MG TAB] 90 tablet 0    Sig: TAKE 1 TABLET BY MOUTH EVERY DAY     Cardiovascular: ARB + Diuretic Combos Passed - 02/07/2024  2:19 PM      Passed - K in normal range and within 180 days    Potassium  Date Value Ref Range Status  01/10/2024 4.6 3.5 - 5.2 mmol/L Final  03/05/2012 3.7 3.5 - 5.1 mmol/L Final         Passed - Na in normal range and within 180 days    Sodium  Date Value Ref Range Status  01/10/2024 141 134 - 144 mmol/L Final  03/05/2012 139 136 - 145 mmol/L Final         Passed - Cr in normal range and within 180 days    Creatinine  Date Value Ref Range Status  03/05/2012 0.79 0.60 - 1.30 mg/dL Final   Creatinine, Ser  Date Value Ref Range Status  01/10/2024 0.61 0.57 - 1.00 mg/dL Final         Passed - eGFR is 10 or above and within 180 days    EGFR (African American)  Date Value Ref  Range Status  03/05/2012 >60 >59mL/min Final   GFR calc Af Amer  Date Value Ref Range Status  08/19/2020 >60 >60 mL/min Final   EGFR (Non-African Amer.)  Date Value Ref Range Status  03/05/2012 >60 >29mL/min Final    Comment:    eGFR values <2mL/min/1.73 m2 may be an indication of chronic kidney disease (CKD). Calculated eGFR, using the MRDR Study equation, is useful in  patients with stable renal function. The eGFR calculation will not be reliable in acutely ill patients when serum creatinine is changing rapidly. It is not useful in patients on dialysis. The eGFR calculation may not be applicable to patients at the low and high extremes of body sizes, pregnant women, and vegetarians.    GFR, Estimated  Date Value Ref Range Status  12/18/2023 >60 >60 mL/min Final    Comment:    (NOTE) Calculated using the CKD-EPI Creatinine Equation (2021)    eGFR  Date Value Ref Range Status  01/10/2024 106 >59 mL/min/1.73 Final         Passed - Patient is not pregnant      Passed - Last BP in normal range    BP Readings from Last 1 Encounters:  01/10/24 134/75         Passed - Valid encounter within last 6 months    Recent Outpatient Visits           4 weeks ago Essential (primary) hypertension   [place] [facility] Simmons-Robinson, [place], MD   1 year ago Annual physical exam   [facility] Merita Norton T, FNP   2 years ago Essential (primary) hypertension   [place] [facility] Merita Norton T, FNP   2 years ago Encounter for annual physical exam   [facility] Beryle Flock, Marzella Schlein, MD   2 years ago Viral URI   [facility] [place], Alessandra [place], [place]       Future Appointments  In 2 months Simmons-Robinson, [place], MD [facility], [facility]             amLODipine (NORVASC) 10 MG tablet [Pharmacy Med Name:  AMLODIPINE BESYLATE 10 MG TAB] 90 tablet 0    Sig: TAKE 1 TABLET BY MOUTH EVERY DAY     Cardiovascular: Calcium Channel Blockers 2 Passed - 02/07/2024  2:19 PM      Passed - Last BP in normal range    BP Readings from Last 1 Encounters:  01/10/24 134/75         Passed - Last Heart Rate in normal range    Pulse Readings from Last 1 Encounters:  01/10/24 68         Passed - Valid encounter within last 6 months    Recent Outpatient Visits           4 weeks ago Essential (primary) hypertension   [place] [facility] Simmons-Robinson, [place], MD   1 year ago Annual physical exam   [place] [facility] Merita Norton T, FNP   2 years ago Essential (primary) hypertension   [place] [facility] Merita Norton T, FNP   2 years ago Encounter for annual physical exam   [place] [facility] [facility] Beryle Flock, Marzella Schlein, MD   2 years ago Viral URI   [facility] [place], Alessandra [place], [place]       Future Appointments             In 2 months Simmons-Robinson, Tawanna Cooler, MD [facility]

## 2024-02-07 NOTE — Telephone Encounter (Signed)
Requested medications are due for refill today.  yes  Requested medications are on the active medications list.  yes  Last refill. 01/03/2024 330 0 rf  Future visit scheduled.   yes  Notes to clinic.  Expired labs.    Requested Prescriptions  Pending Prescriptions Disp Refills   rosuvastatin (CRESTOR) 20 MG tablet [Pharmacy Med Name: ROSUVASTATIN CALCIUM 20 MG TAB] 30 tablet 0    Sig: TAKE 1 TABLET BY MOUTH EVERY DAY     Cardiovascular:  Antilipid - Statins 2 Failed - 02/07/2024  2:20 PM      Failed - Lipid Panel in normal range within the last 12 months    Cholesterol, Total  Date Value Ref Range Status  08/20/2022 204 (H) 100 - 199 mg/dL Final   LDL Chol Calc (NIH)  Date Value Ref Range Status  08/20/2022 128 (H) 0 - 99 mg/dL Final   HDL  Date Value Ref Range Status  08/20/2022 51 >39 mg/dL Final   Triglycerides  Date Value Ref Range Status  08/20/2022 139 0 - 149 mg/dL Final         Passed - Cr in normal range and within 360 days    Creatinine  Date Value Ref Range Status  03/05/2012 0.79 0.60 - 1.30 mg/dL Final   Creatinine, Ser  Date Value Ref Range Status  01/10/2024 0.61 0.57 - 1.00 mg/dL Final         Passed - Patient is not pregnant      Passed - Valid encounter within last 12 months    Recent Outpatient Visits           4 weeks ago Essential (primary) hypertension   [place] [facility] Simmons-Robinson, Tawanna Cooler, MD   1 year ago Annual physical exam   [place] [facility] Merita Norton T, FNP   2 years ago Essential (primary) hypertension   [place] [facility] Merita Norton T, FNP   2 years ago Encounter for annual physical exam   [place] [facility], Marzella Schlein, MD   2 years ago Viral URI   [facility], Alessandra [place], [place]       Future Appointments             In 2 months Simmons-Robinson, [place], MD [facility]            Signed Prescriptions Disp Refills   metoprolol tartrate (LOPRESSOR) 25 MG tablet 180 tablet 0    Sig: TAKE 1 TABLET BY MOUTH TWICE A DAY     Cardiovascular:  Beta Blockers Passed - 02/07/2024  2:20 PM      Passed - Last BP in normal range    BP Readings from Last 1 Encounters:  01/10/24 134/75         Passed - Last Heart Rate in normal range    Pulse Readings from Last 1 Encounters:  01/10/24 68         Passed - Valid encounter within last 6 months    Recent Outpatient Visits           4 weeks ago Essential (primary) hypertension   [place] [facility] Simmons-Robinson, [place], MD   1 year ago Annual physical exam   [facility] Merita Norton T, FNP   2 years ago Essential (primary) hypertension   [place] [facility] Jacky Kindle, [place]  2 years ago Encounter for annual physical exam   [place] [facility] [place], Marzella Schlein, MD   2 years ago Viral URI   [place] [facility] [place], Alessandra [place], [place]       Future Appointments             In 2 months Simmons-Robinson, Tawanna Cooler, MD [facility]             losartan-hydrochlorothiazide (HYZAAR) 100-25 MG tablet 90 tablet 0    Sig: TAKE 1 TABLET BY MOUTH EVERY DAY     Cardiovascular: ARB + Diuretic Combos Passed - 02/07/2024  2:20 PM      Passed - K in normal range and within 180 days    Potassium  Date Value Ref Range Status  01/10/2024 4.6 3.5 - 5.2 mmol/L Final  03/05/2012 3.7 3.5 - 5.1 mmol/L Final         Passed - Na in normal range and within 180 days    Sodium  Date Value Ref Range Status  01/10/2024 141 134 - 144 mmol/L Final  03/05/2012 139 136 - 145 mmol/L Final         Passed - Cr in normal range and within 180 days    Creatinine  Date Value Ref Range Status  03/05/2012 0.79 0.60 - 1.30 mg/dL Final   Creatinine, Ser  Date  Value Ref Range Status  01/10/2024 0.61 0.57 - 1.00 mg/dL Final         Passed - eGFR is 10 or above and within 180 days    EGFR (African American)  Date Value Ref Range Status  03/05/2012 >60 >44mL/min Final   GFR calc Af Amer  Date Value Ref Range Status  08/19/2020 >60 >60 mL/min Final   EGFR (Non-African Amer.)  Date Value Ref Range Status  03/05/2012 >60 >59mL/min Final    Comment:    eGFR values <56mL/min/1.73 m2 may be an indication of chronic kidney disease (CKD). Calculated eGFR, using the MRDR Study equation, is useful in  patients with stable renal function. The eGFR calculation will not be reliable in acutely ill patients when serum creatinine is changing rapidly. It is not useful in patients on dialysis. The eGFR calculation may not be applicable to patients at the low and high extremes of body sizes, pregnant women, and vegetarians.    GFR, Estimated  Date Value Ref Range Status  12/18/2023 >60 >60 mL/min Final    Comment:    (NOTE) Calculated using the CKD-EPI Creatinine Equation (2021)    eGFR  Date Value Ref Range Status  01/10/2024 106 >59 mL/min/1.73 Final         Passed - Patient is not pregnant      Passed - Last BP in normal range    BP Readings from Last 1 Encounters:  01/10/24 134/75         Passed - Valid encounter within last 6 months    Recent Outpatient Visits           4 weeks ago Essential (primary) hypertension   [place] [facility] Simmons-Robinson, [place], MD   1 year ago Annual physical exam   [facility] Mer[facility] Norton T, FNP   2 years ago Essential (primary) hypertension   [facility] Mer[facility] Norton T, FNP   2 years ago Encounter for annual physical exam   [facility] [place], Marzella Schlein,  MD   2 years ago Viral URI   [facility] [place], Alessandra [place], [place]       Future Appointments              In 2 months Simmons-Robinson, Tawanna Cooler, MD [facility]             amLODipine (NORVASC) 10 MG tablet 90 tablet 0    Sig: TAKE 1 TABLET BY MOUTH EVERY DAY     Cardiovascular: Calcium Channel Blockers 2 Passed - 02/07/2024  2:20 PM      Passed - Last BP in normal range    BP Readings from Last 1 Encounters:  01/10/24 134/75         Passed - Last Heart Rate in normal range    Pulse Readings from Last 1 Encounters:  01/10/24 68         Passed - Valid encounter within last 6 months    Recent Outpatient Visits           4 weeks ago Essential (primary) hypertension   [place] [facility] Simmons-Robinson, [place], MD   1 year ago Annual physical exam   [place] [facility] Merita Norton T, FNP   2 years ago Essential (primary) hypertension   [facility] Merita Norton T, FNP   2 years ago Encounter for annual physical exam   [facility] [place], Marzella Schlein, MD   2 years ago Viral URI   [facility] [place], Alessandra [place], [place]       Future Appointments             In 2 months Simmons-Robinson, Tawanna Cooler, MD [facility]

## 2024-03-14 ENCOUNTER — Other Ambulatory Visit: Payer: Self-pay | Admitting: Family Medicine

## 2024-03-14 DIAGNOSIS — E7800 Pure hypercholesterolemia, unspecified: Secondary | ICD-10-CM

## 2024-03-15 NOTE — Telephone Encounter (Signed)
 Requested Prescriptions  Pending Prescriptions Disp Refills   rosuvastatin (CRESTOR) 20 MG tablet [Pharmacy Med Name: ROSUVASTATIN CALCIUM 20 MG TAB] 90 tablet 0    Sig: TAKE 1 TABLET BY MOUTH EVERY DAY     Cardiovascular:  Antilipid - Statins 2 Failed - 03/15/2024 12:46 PM      Failed - Valid encounter within last 12 months    Recent Outpatient Visits   None     Future Appointments             In 3 weeks Simmons-Robinson, Mak[place], MD [facility]            Failed - Lipid Panel in normal range within the last 12 months    Cholesterol, Total  Date Value Ref Range Status  08/20/2022 204 (H) 100 - 199 mg/dL Final   LDL Chol Calc (NIH)  Date Value Ref Range Status  08/20/2022 128 (H) 0 - 99 mg/dL Final   HDL  Date Value Ref Range Status  08/20/2022 51 >39 mg/dL Final   Triglycerides  Date Value Ref Range Status  08/20/2022 139 0 - 149 mg/dL Final         Passed - Cr in normal range and within 360 days    Creatinine  Date Value Ref Range Status  03/05/2012 0.79 0.60 - 1.30 mg/dL Final   Creatinine, Ser  Date Value Ref Range Status  01/10/2024 0.61 0.57 - 1.00 mg/dL Final         Passed - Patient is not pregnant

## 2024-03-16 ENCOUNTER — Encounter: Payer: Self-pay | Admitting: Oncology

## 2024-03-16 ENCOUNTER — Inpatient Hospital Stay: Payer: BC Managed Care – PPO | Admitting: Oncology

## 2024-03-16 ENCOUNTER — Inpatient Hospital Stay: Payer: BC Managed Care – PPO | Attending: Oncology

## 2024-03-16 VITALS — BP 160/101 | Temp 98.1°F | Resp 16 | Wt 207.4 lb

## 2024-03-16 DIAGNOSIS — F1090 Alcohol use, unspecified, uncomplicated: Secondary | ICD-10-CM | POA: Diagnosis not present

## 2024-03-16 DIAGNOSIS — Z720 Tobacco use: Secondary | ICD-10-CM | POA: Diagnosis not present

## 2024-03-16 DIAGNOSIS — D751 Secondary polycythemia: Secondary | ICD-10-CM | POA: Diagnosis not present

## 2024-03-16 LAB — CMP (CANCER CENTER ONLY)
ALT: 42 U/L (ref 0–44)
AST: 46 U/L — ABNORMAL HIGH (ref 15–41)
Albumin: 4.4 g/dL (ref 3.5–5.0)
Alkaline Phosphatase: 64 U/L (ref 38–126)
Anion gap: 10 (ref 5–15)
BUN: 11 mg/dL (ref 6–20)
CO2: 24 mmol/L (ref 22–32)
Calcium: 9 mg/dL (ref 8.9–10.3)
Chloride: 98 mmol/L (ref 98–111)
Creatinine: 0.65 mg/dL (ref 0.44–1.00)
GFR, Estimated: >60 mL/min (ref >60)
Glucose, Bld: 97 mg/dL (ref 70–99)
Potassium: 4.3 mmol/L (ref 3.5–5.1)
Sodium: 132 mmol/L — ABNORMAL LOW (ref 135–145)
Total Bilirubin: 1.3 mg/dL — ABNORMAL HIGH (ref 0.0–1.2)
Total Protein: 8.2 g/dL — ABNORMAL HIGH (ref 6.5–8.1)

## 2024-03-16 LAB — CBC WITH DIFFERENTIAL (CANCER CENTER ONLY)
Abs Immature Granulocytes: 0.03 10*3/uL (ref 0.00–0.07)
Basophils Absolute: 0 10*3/uL (ref 0.0–0.1)
Basophils Relative: 0 %
Eosinophils Absolute: 0.1 10*3/uL (ref 0.0–0.5)
Eosinophils Relative: 2 %
HCT: 40.4 % (ref 36.0–46.0)
Hemoglobin: 13.8 g/dL (ref 12.0–15.0)
Immature Granulocytes: 0 %
Lymphocytes Relative: 32 %
Lymphs Abs: 3 10*3/uL (ref 0.7–4.0)
MCH: 29.9 pg (ref 26.0–34.0)
MCHC: 34.2 g/dL (ref 30.0–36.0)
MCV: 87.4 fL (ref 80.0–100.0)
Monocytes Absolute: 0.6 10*3/uL (ref 0.1–1.0)
Monocytes Relative: 6 %
Neutro Abs: 5.6 10*3/uL (ref 1.7–7.7)
Neutrophils Relative %: 60 %
Platelet Count: 293 10*3/uL (ref 150–400)
RBC: 4.62 MIL/uL (ref 3.87–5.11)
RDW: 15.1 % (ref 11.5–15.5)
WBC Count: 9.3 10*3/uL (ref 4.0–10.5)
nRBC: 0 % (ref 0.0–0.2)

## 2024-03-16 NOTE — Progress Notes (Signed)
 Hematology/Oncology Consult note [phone] [phone]      CHIEF COMPLAINTS/REASON FOR VISIT:  Secondary erythrocytosis  ASSESSMENT & PLAN:   Secondary erythrocytosis Labs are reviewed and discussed with patient. JAK2 V617F mutation negative, with reflex to other mutations CALR, MPL, JAK 2 Ex 12-15 mutations negative. Negative BCR-ABL.  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  Hct <52, no need for phlebotomy.  Hb has improved.   Tobacco use Encourage her smoke cessation effort  Refer to lung cancer screening program   Orders Placed This Encounter  Procedures   Ambulatory Referral for Lung Cancer Screening    Referral Priority:   Routine    Referral Type:   Consultation    Referral Reason:   Specialty Services Required    Number of Visits Requested:   1   Patient is discharged from my clinic. I recommend patient to continue follow up with primary care physician. Patient may re-establish care in the future if clinically indicated.'  All questions were answered. The patient knows to call the clinic with any problems, questions or concerns.  Rickard Patience, MD, PhD [facility] Hematology Oncology 03/16/2024      HISTORY OF PRESENTING ILLNESS:   Stephanie Jacobs is a  58 y.o.  female with PMH listed below was seen in consultation at the request of  Jacky Kindle, FNP  for evaluation of /erythrocytosis/elevated hemoglobin,   Patient has abnormal CBC with hemoglobin of 16.5 on 08/20/22.  Reviewed patient's previous labs.  elevated hemoglobin is chronic onset, dated back to 2021  Patient denies unintentional weight loss, fever, chills, fatigue, night sweats.  Patient is currently every day smoker Denies previous VTE history. She has a history of sleep apnea, not using CPAP machine.   INTERVAL HISTORY Stephanie Jacobs is a 58 y.o. female who has above history reviewed by me today presents for follow up visit for  secondary erythrocytosis.  She presents to discuss results. No new complains.  Previously smokes 1PPD, now 0.5ppd  Patient reports feeling well.  She has been more active since last visit.  She does exercise regularly.     MEDICAL HISTORY:  Past Medical History:  Diagnosis Date   Elevated liver enzymes 08/27/2022   Hypertension    Sickle cell trait (HCC)     SURGICAL HISTORY: Past Surgical History:  Procedure Laterality Date   ABDOMINAL HYSTERECTOMY     BREAST BIOPSY Right 05/02/2018   Affirm Bx- Fibrocystic breast tissue - coil clip   COLONOSCOPY N/A 07/17/2021   Procedure: COLONOSCOPY;  Surgeon: Wyline Mood, MD;  Location: [facility] ENDOSCOPY;  Service: Gastroenterology;  Laterality: N/A;   COLONOSCOPY WITH PROPOFOL N/A 02/22/2020   Procedure: COLONOSCOPY WITH PROPOFOL;  Surgeon: Wyline Mood, MD;  [facility] END[facility]COPY;  Service: Gastroenterology;  Laterality: N/A;   CYSTOSCOPY  08/07/2019   Procedure: CYSTOSCOPY;  Surgeon: Nadara Mustard, MD;  Location: [facility] ORS;  Service: Gynecology;;   DIAGNOSTIC LAPAROSCOPY     ESOPHAGOGASTRODUODENOSCOPY (EGD) WITH PROPOFOL N/A 12/17/2023   Procedure: ESOPHAGOGASTRODUODENOSCOPY (EGD) WITH PROPOFOL;  Surgeon: Regis Bill, MD;  Location: [facility] ENDOSCOPY;  Service: Endoscopy;  Laterality: N/A;   KNEE ARTHROSCOPY WITH MEDIAL MENISECTOMY Right 08/21/2020   Procedure: RIGHT KNEE ARTHROSCOPY WITH MEDIAL MENISECTOMY;  Surgeon: Juanell Fairly, MD;  Location: [facility] ORS;  Service: Orthopedics;  Laterality: Right;   TOTAL LAPAROSCOPIC HYSTERECTOMY WITH BILATERAL SALPINGO OOPHORECTOMY N/A 08/07/2019   Procedure: TOTAL LAPAROSCOPIC HYSTERECTOMY WITH BILATERAL SALPINGO;  Surgeon: Nadara Mustard, MD;  Location: [facility] ORS;  Service: Gynecology;  Laterality: N/A;   TUBAL LIGATION  1995    SOCIAL HISTORY: Social History   Socioeconomic History   Marital status: Married    Spouse name: Not on file   Number of children: Not on file   Years of  education: Not on file   Highest education level: Not on file  Occupational History   Not on file  Tobacco Use   Smoking status: Every Day    Current packs/day: 1.00    Average packs/day: 1 pack/day for 35.0 years (35.0 ttl pk-yrs)    Types: Cigarettes   Smokeless tobacco: Never  Vaping Use   Vaping status: Never Used  Substance and Sexual Activity   Alcohol use: Yes    Alcohol/week: 7.0 standard drinks of alcohol    Types: 7 Glasses of wine per week    Comment: One glass of red wine in the evenings.   Drug use: No   Sexual activity: Yes    Birth control/protection: Surgical    Comment: Hysterectomy  Other Topics Concern   Not on file  Social History Narrative   Not on file   Social Drivers of Health   Financial Resource Strain: Not on file  Food Insecurity: No Food Insecurity (12/16/2023)   Hunger Vital Sign    Worried About Running Out of Food in the Last Year: Never true    Ran Out of Food in the Last Year: Never true  Transportation Needs: No Transportation Needs (12/16/2023)   PRAPARE - Administrator, Civil Service (Medical): No    Lack of Transportation (Non-Medical): No  Physical Activity: Not on file  Stress: Not on file  Social Connections: Not on file  Intimate Partner Violence: Not At Risk (12/16/2023)   Humiliation, Afraid, Rape, and Kick questionnaire    Fear of Current or Ex-Partner: No    Emotionally Abused: No    Physically Abused: No    Sexually Abused: No    FAMILY HISTORY: Family History  Problem Relation Age of Onset   Hypertension Mother    Diabetes Mother    Sickle cell anemia Brother    Sickle cell trait Brother    Breast cancer Neg Hx     ALLERGIES:  is allergic to tape.  MEDICATIONS:  Current Outpatient Medications  Medication Sig Dispense Refill   amLODipine (NORVASC) 10 MG tablet TAKE 1 TABLET BY MOUTH EVERY DAY 90 tablet 0   folic acid (FOLVITE) 1 MG tablet Take 1 tablet (1 mg total) by mouth daily. 30 tablet 1    lactulose (CHRONULAC) 10 GM/15ML solution Take 15 mLs (10 g total) by mouth daily as needed for mild constipation. 236 mL 1   losartan-hydrochlorothiazide (HYZAAR) 100-25 MG tablet TAKE 1 TABLET BY MOUTH EVERY DAY 90 tablet 0   metoprolol tartrate (LOPRESSOR) 25 MG tablet TAKE 1 TABLET BY MOUTH TWICE A DAY 180 tablet 0   Multiple Vitamin (MULTIVITAMIN) tablet Take 1 tablet by mouth daily.     ondansetron (ZOFRAN-ODT) 4 MG disintegrating tablet Take 1 tablet (4 mg total) by mouth every 8 (eight) hours as needed for nausea or vomiting. 20 tablet 0   rosuvastatin (CRESTOR) 20 MG tablet TAKE 1 TABLET BY MOUTH EVERY DAY 90 tablet 0   thiamine (VITAMIN B-1) 100 MG tablet Take 1 tablet (100 mg total) by mouth daily. 30 tablet 1   valACYclovir (VALTREX) 1000 MG tablet Take 1 tablet (1,000 mg total) by mouth 2 (two)  times daily. Use one tablet, twice daily for flares; 3-7 days typically course 20 tablet 11   promethazine-dextromethorphan (PROMETHAZINE-DM) 6.25-15 MG/5ML syrup Take 5 mLs by mouth every 4 (four) hours as needed. (Patient not taking: Reported on 03/16/2024)     No current facility-administered medications for this visit.     Review of Systems  Constitutional:  Negative for appetite change, chills, fatigue and fever.  HENT:   Negative for hearing loss and voice change.   Eyes:  Negative for eye problems.  Respiratory:  Negative for chest tightness and cough.   Cardiovascular:  Negative for chest pain.  Gastrointestinal:  Negative for abdominal distention, abdominal pain and blood in stool.  Endocrine: Negative for hot flashes.  Genitourinary:  Negative for difficulty urinating and frequency.   Musculoskeletal:  Negative for arthralgias.  Skin:  Negative for itching and rash.  Neurological:  Negative for extremity weakness.  Hematological:  Negative for adenopathy.  Psychiatric/Behavioral:  Negative for confusion.     PHYSICAL EXAMINATION:  Vitals:   03/16/24 1204 03/16/24 1206   BP: (!) 164/113 (!) 160/101  Resp:    Temp:     Filed Weights   03/16/24 1200  Weight: 207 lb 6.4 oz (94.1 kg)    Physical Exam Constitutional:      General: She is not in acute distress.    Appearance: She is obese.  HENT:     Head: Normocephalic and atraumatic.  Eyes:     General: No scleral icterus. Cardiovascular:     Rate and Rhythm: Normal rate and regular rhythm.  Pulmonary:     Effort: Pulmonary effort is normal. No respiratory distress.  Abdominal:     General: There is no distension.  Musculoskeletal:        General: Normal range of motion.     Cervical back: Normal range of motion and neck supple.  Neurological:     Mental Status: She is alert and oriented to person, place, and time. Mental status is at baseline.  Psychiatric:        Mood and Affect: Mood normal.     LABORATORY DATA:  I have reviewed the data as listed    Latest Ref Rng & Units 03/16/2024   11:41 AM 01/10/2024   11:18 AM 12/18/2023    5:54 AM  CBC  WBC 4.0 - 10.5 K/uL 9.3  7.9  17.3   Hemoglobin 12.0 - 15.0 g/dL 82.9  56.2  13.0   Hematocrit 36.0 - 46.0 % 40.4  47.1  41.3   Platelets 150 - 400 K/uL 293  368  248       Latest Ref Rng & Units 03/16/2024   11:41 AM 01/10/2024   11:18 AM 12/18/2023    5:54 AM  CMP  Glucose 70 - 99 mg/dL 97  78  865   BUN 6 - 20 mg/dL 11  10  12    Creatinine 0.44 - 1.00 mg/dL 7.84  6.96  2.95   Sodium 135 - 145 mmol/L 132  141  134   Potassium 3.5 - 5.1 mmol/L 4.3  4.6  3.4   Chloride 98 - 111 mmol/L 98  97  97   CO2 22 - 32 mmol/L 24  26  28    Calcium 8.9 - 10.3 mg/dL 9.0  28.4  8.9   Total Protein 6.5 - 8.1 g/dL 8.2  8.0    Total Bilirubin 0.0 - 1.2 mg/dL 1.3  0.5    Alkaline Phos 38 -  126 U/L 64  87    AST 15 - 41 U/L 46  45    ALT 0 - 44 U/L 42  35       RADIOGRAPHIC STUDIES: I have personally reviewed the radiological images as listed and agreed with the findings in the report. No results found.

## 2024-03-16 NOTE — Progress Notes (Signed)
 Patient denies new or acute problems/concerns today.

## 2024-03-16 NOTE — Assessment & Plan Note (Signed)
Encourage her smoke cessation effort  Refer to lung cancer screening program

## 2024-03-16 NOTE — Assessment & Plan Note (Addendum)
Labs are reviewed and discussed with patient. JAK2 V617F mutation negative, with reflex to other mutations CALR, MPL, JAK 2 Ex 12-15 mutations negative. Negative BCR-ABL.  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  Hct <52, no need for phlebotomy.  Hb has improved.

## 2024-03-22 ENCOUNTER — Telehealth: Payer: Self-pay | Admitting: Family Medicine

## 2024-03-22 NOTE — Telephone Encounter (Signed)
 Copied from CRM [id number]. Topic: Clinical - Medication Question >> Mar 22, 2024  8:37 AM Fuller Mandril wrote: Reason for CRM: Patient called wanted to know if provider would consider starting Chantix for her. She has taking it previously but it was years ago. Has discussed with provider but was not ready at that time. Is ready now. [id number] [place], [place] - [phone] Thank You

## 2024-03-22 NOTE — Telephone Encounter (Signed)
 Will plan to discuss new start for medication to help with smoking cessation at next office visit scheduled for 04/09/24

## 2024-03-22 NOTE — Telephone Encounter (Signed)
 Left voice message for patient stating that new start of medication for smoking cessation will be discussed at next office visit on 04/09/24.

## 2024-03-27 DIAGNOSIS — Z20822 Contact with and (suspected) exposure to covid-19: Secondary | ICD-10-CM | POA: Diagnosis not present

## 2024-03-27 DIAGNOSIS — R0602 Shortness of breath: Secondary | ICD-10-CM | POA: Diagnosis not present

## 2024-03-27 DIAGNOSIS — J069 Acute upper respiratory infection, unspecified: Secondary | ICD-10-CM | POA: Diagnosis not present

## 2024-03-30 ENCOUNTER — Other Ambulatory Visit: Payer: Self-pay

## 2024-03-30 ENCOUNTER — Telehealth: Payer: Self-pay | Admitting: Acute Care

## 2024-03-30 DIAGNOSIS — Z122 Encounter for screening for malignant neoplasm of respiratory organs: Secondary | ICD-10-CM

## 2024-03-30 DIAGNOSIS — Z87891 Personal history of nicotine dependence: Secondary | ICD-10-CM

## 2024-03-30 NOTE — Telephone Encounter (Signed)
 Lung Cancer Screening Narrative/Criteria Questionnaire (Cigarette Smokers Only- No Cigars/Pipes/vapes)   Stephanie Jacobs   SDMV:04/20/24 at 11am/Natalie                                           1966-10-05              LDCT: 04/25/24 at 3pm / [facility]    58 y.o.   Phone: [phone]  Lung Screening Narrative (confirm age 11-77 yrs Medicare / 50-80 yrs Private pay insurance)   Insurance information:BCBS   Referring Provider:Yu   This screening involves an initial phone call with a team member from our program. It is called a shared decision making visit. The initial meeting is required by insurance and Medicare to make sure you understand the program. This appointment takes about 15-20 minutes to complete. The CT scan will completed at a separate date/time. This scan takes about 5-10 minutes to complete and you may eat and drink before and after the scan.  Criteria questions for Lung Cancer Screening:   Are you a current or former smoker? Former Age began smoking: 74 y   If you are a former smoker, what year did you quit smoking? Quit April 1 of 2025   To calculate your smoking history, I need an accurate estimate of how many packs of cigarettes you smoked per day and for how many years. (Not just the number of PPD you are now smoking)   Years smoking 40 x Packs per day 1 = Pack years 40   (at least 20 pack yrs)   (Make sure they understand that we need to know how much they have smoked in the past, not just the number of PPD they are smoking now)  Do you have a personal history of cancer?  No    Do you have a family history of cancer? No  Are you coughing up blood?  No  Have you had unexplained weight loss of 15 lbs or more in the last 6 months? No  It looks like you meet all criteria.     Additional information: N/A

## 2024-04-09 ENCOUNTER — Ambulatory Visit: Payer: Self-pay | Admitting: Family Medicine

## 2024-04-20 ENCOUNTER — Ambulatory Visit (INDEPENDENT_AMBULATORY_CARE_PROVIDER_SITE_OTHER): Admitting: Acute Care

## 2024-04-20 DIAGNOSIS — Z87891 Personal history of nicotine dependence: Secondary | ICD-10-CM

## 2024-04-20 NOTE — Progress Notes (Addendum)
 Provider Attestation I agree with the documentation of the Shared Decision Making visit,  smoking cessation counseling if appropriate, and verification or eligibility for lung cancer screening as documented by the RN Nurse Navigator.   Lauraine PHEBE Lites, MSN, AGACNP-BC [facility] See Amion for personal pager PCCM on call pager [phone]     Virtual Visit via Telephone Note  I connected with Reena DELENA Castilla on 04/20/24 at 11:00 AM EDT by telephone and verified that I am speaking with the correct person using two identifiers.  Location: Patient: Stephanie Jacobs Provider: Laneta Speaks, RN   I discussed the limitations, risks, security and privacy concerns of performing an evaluation and management service by telephone and the availability of in person appointments. I also discussed with the patient that there may be a patient responsible charge related to this service. The patient expressed understanding and agreed to proceed.    Shared Decision Making Visit Lung Cancer Screening Program [phone])   Eligibility: Age 58 y.o. Pack Years Smoking History Calculation 40 (# packs/per year x # years smoked) Recent History of coughing up blood  no Unexplained weight loss? no ( >Than 15 pounds within the last 6 months ) Prior History Lung / other cancer no (Diagnosis within the last 5 years already requiring surveillance chest CT Scans). Smoking Status Former Smoker Former Smokers: Years since quit: < 1 year  Quit Date: 03/20/2024  Visit Components: Discussion included one or more decision making aids. yes Discussion included risk/benefits of screening. yes Discussion included potential follow up diagnostic testing for abnormal scans. yes Discussion included meaning and risk of over diagnosis. yes Discussion included meaning and risk of False Positives. yes Discussion included meaning of total radiation exposure. yes  Counseling Included: Importance of  adherence to annual lung cancer LDCT screening. yes Impact of comorbidities on ability to participate in the program. yes Ability and willingness to under diagnostic treatment. yes  Smoking Cessation Counseling: Current Smokers:  Discussed importance of smoking cessation. yes Information about tobacco cessation classes and interventions provided to patient. yes Patient provided with ticket for LDCT Scan. no Symptomatic Patient. no  Counseling(Intermediate counseling: > three minutes) [id number] Diagnosis Code: Tobacco Use Z72.0 Asymptomatic Patient yes  Counseling (Intermediate counseling: > three minutes counseling) [id number] Former Smokers:  Discussed the importance of maintaining cigarette abstinence. yes Diagnosis Code: Personal History of Nicotine Dependence. S12.108 Information about tobacco cessation classes and interventions provided to patient. Yes Patient provided with ticket for LDCT Scan. no Written Order for Lung Cancer Screening with LDCT placed in Epic. Yes (CT Chest Lung Cancer Screening Low Dose W/O CM) [id number] Z12.2-Screening of respiratory organs Z87.891-Personal history of nicotine dependence   Laneta Speaks, RN

## 2024-04-20 NOTE — Patient Instructions (Signed)

## 2024-04-23 ENCOUNTER — Encounter: Payer: Self-pay | Admitting: Acute Care

## 2024-04-25 ENCOUNTER — Ambulatory Visit
Admission: RE | Admit: 2024-04-25 | Discharge: 2024-04-25 | Disposition: A | Discharge status: Home / Self Care | Source: Ambulatory Visit | Attending: Family Medicine | Admitting: Family Medicine

## 2024-04-25 DIAGNOSIS — F17210 Nicotine dependence, cigarettes, uncomplicated: Secondary | ICD-10-CM | POA: Diagnosis not present

## 2024-04-25 DIAGNOSIS — Z122 Encounter for screening for malignant neoplasm of respiratory organs: Secondary | ICD-10-CM | POA: Diagnosis not present

## 2024-04-25 DIAGNOSIS — Z87891 Personal history of nicotine dependence: Secondary | ICD-10-CM | POA: Diagnosis not present

## 2024-05-07 ENCOUNTER — Ambulatory Visit (INDEPENDENT_AMBULATORY_CARE_PROVIDER_SITE_OTHER): Admitting: Family Medicine

## 2024-05-07 ENCOUNTER — Encounter: Payer: Self-pay | Admitting: Family Medicine

## 2024-05-07 VITALS — BP 128/82 | HR 73 | Ht 63.0 in | Wt 196.0 lb

## 2024-05-07 DIAGNOSIS — Z720 Tobacco use: Secondary | ICD-10-CM | POA: Diagnosis not present

## 2024-05-07 DIAGNOSIS — Z1211 Encounter for screening for malignant neoplasm of colon: Secondary | ICD-10-CM

## 2024-05-07 DIAGNOSIS — I10 Essential (primary) hypertension: Secondary | ICD-10-CM

## 2024-05-07 DIAGNOSIS — Z860100 Personal history of colon polyps, unspecified: Secondary | ICD-10-CM

## 2024-05-07 DIAGNOSIS — E782 Mixed hyperlipidemia: Secondary | ICD-10-CM | POA: Insufficient documentation

## 2024-05-07 MED ORDER — VARENICLINE TARTRATE (STARTER) 0.5 MG X 11 & 1 MG X 42 PO TBPK: ORAL_TABLET | ORAL | 0 refills | Status: AC

## 2024-05-07 NOTE — Progress Notes (Signed)
 Established patient visit   Patient: Stephanie Jacobs   DOB: 02-10-1966   58 y.o. Female  MRN: [id number] Visit Date: 05/07/2024  Today's healthcare provider: Mimi Alt, MD   Chief Complaint  Patient presents with   Hypertension   Subjective       Discussed the use of AI scribe software for clinical note transcription with the patient, who gave verbal consent to proceed.  History of Present Illness Stephanie Jacobs is a 58 year old female with hypertension who presents for follow-up.  Her blood pressure was initially 150/88 mmHg but improved to 140/88 mmHg during the visit. She is currently on amlodipine  2 mg daily, losartan  100 mg, hydrochlorothiazide  25 mg daily, and metoprolol  tartrate 25 mg twice daily. She skipped her medication over the weekend due to being busy but resumed taking them this morning. No lightheadedness, dizziness, or wooziness, but she feels stressed.  She is on Crestor  20 mg daily for hyperlipidemia and uses Valtrex  1000 mg twice daily as needed for HSV flares. For intermittent constipation, she uses 15 mL of Electrolyte.  She has a history of smoking and recently attempted to quit, abstaining for the entire month of April until last week. She used nicotine gum during her cessation attempt.  In terms of health maintenance, she is due for a colonoscopy this year, with a history of polyps noted in her last colonoscopy in 2022. She has received the first dose of the Shingrix vaccine but not the second due to a shortage. She underwent a low-dose chest CT for lung cancer screening on Apr 25, 2024, but the results are pending due to a delay in reading.  She reports losing 10 pounds since January and mentions using black seed bitters, which she believes may have contributed to her improved lab values.     Past Medical History:  Diagnosis Date   Elevated liver enzymes 08/27/2022   Hypertension    Sickle cell trait (HCC)      Medications: Outpatient Medications Prior to Visit  Medication Sig   amLODipine  (NORVASC ) 10 MG tablet TAKE 1 TABLET BY MOUTH EVERY DAY   folic acid  (FOLVITE ) 1 MG tablet Take 1 tablet (1 mg total) by mouth daily.   lactulose  (CHRONULAC ) 10 GM/15ML solution Take 15 mLs (10 g total) by mouth daily as needed for mild constipation.   losartan -hydrochlorothiazide  (HYZAAR) 100-25 MG tablet TAKE 1 TABLET BY MOUTH EVERY DAY   metoprolol  tartrate (LOPRESSOR ) 25 MG tablet TAKE 1 TABLET BY MOUTH TWICE A DAY   Multiple Vitamin (MULTIVITAMIN) tablet Take 1 tablet by mouth daily.   ondansetron  (ZOFRAN -ODT) 4 MG disintegrating tablet Take 1 tablet (4 mg total) by mouth every 8 (eight) hours as needed for nausea or vomiting.   rosuvastatin  (CRESTOR ) 20 MG tablet TAKE 1 TABLET BY MOUTH EVERY DAY   thiamine  (VITAMIN B-1) 100 MG tablet Take 1 tablet (100 mg total) by mouth daily.   valACYclovir  (VALTREX ) 1000 MG tablet Take 1 tablet (1,000 mg total) by mouth 2 (two) times daily. Use one tablet, twice daily for flares; 3-7 days typically course   promethazine -dextromethorphan (PROMETHAZINE -DM) 6.25-15 MG/5ML syrup Take 5 mLs by mouth every 4 (four) hours as needed. (Patient not taking: Reported on 05/07/2024)   No facility-administered medications prior to visit.    Review of Systems      Objective    BP 128/82 (Cuff Size: Large)   Pulse 73   Ht 5\' 3"  (1.6 m)  Wt 196 lb (88.9 kg)   LMP 07/21/2019 (Exact Date)   SpO2 98%   BMI 34.72 kg/m  BP Readings from Last 3 Encounters:  05/07/24 128/82  03/16/24 (!) 160/101  01/10/24 134/75   Wt Readings from Last 3 Encounters:  05/07/24 196 lb (88.9 kg)  04/25/24 207 lb (93.9 kg)  03/16/24 207 lb 6.4 oz (94.1 kg)        Physical Exam Vitals reviewed.  Constitutional:      General: She is not in acute distress.    Appearance: Normal appearance. She is not ill-appearing.  Cardiovascular:     Rate and Rhythm: Normal rate and regular rhythm.   Pulmonary:     Effort: Pulmonary effort is normal. No respiratory distress.     Breath sounds: No wheezing, rhonchi or rales.  Neurological:     Mental Status: She is alert and oriented to person, place, and time.  Psychiatric:        Mood and Affect: Mood normal.        Behavior: Behavior normal.       No results found for any visits on 05/07/24.  Assessment & Plan     Problem List Items Addressed This Visit       Cardiovascular and Mediastinum   Essential (primary) hypertension - Primary   Hypertension, Chronic Hypertension with current blood pressure readings of 140/88 mmHg, previously elevated to 150 systolic. Reports occasional non-adherence to medication regimen due to being busy, but no symptoms of dizziness or lightheadedness. Stress is a contributing factor to elevated blood pressure. Manual blood pressure reading showed improvement to 128/82 mmHg. - Continue current antihypertensive regimen: amlodipine  2 mg daily, losartan  100 mg daily, hydrochlorothiazide  25 mg daily. - Recheck blood pressure manually to confirm readings. - Follow up in four months for annual physical.        Other   Tobacco use (Chronic)   Current tobacco use Current tobacco use with recent cessation attempt in April, but resumed smoking last week. Expressed motivation to quit and has previously used nicotine gum successfully. Will start Chantix  for smoking cessation. - Start Chantix  starter pack for smoking cessation. - Instruct to send a message upon completion of the starter pack to transition to continuation pack. - Encourage to set a follow-up appointment in September for physical examination.      Relevant Medications   Varenicline  Tartrate, Starter, (CHANTIX  STARTING MONTH PAK) 0.5 MG X 11 & 1 MG X 42 TBPK   Mixed hyperlipidemia    Hyperlipidemia, Chronic  Hyperlipidemia managed with Crestor  20 mg daily. - Continue Crestor  20 mg daily.      Other Visit Diagnoses       Screening  for colon cancer       Relevant Orders   Ambulatory referral to Gastroenterology     History of colon polyps       Relevant Orders   Ambulatory referral to Gastroenterology       Assessment & Plan   Herpes Simplex Virus (HSV) Herpes Simplex Virus managed with Valtrex  1000 mg twice daily as needed for flares.  General Health Maintenance Due for a colonoscopy this year with polyps. Lung cancer screening completed on Apr 25, 2024, but results are pending due to a delay in radiology. Received the first dose of Shingrix but not the second due to a shortage. Pneumococcal vaccination status unclear. - Provide referral for colonoscopy due to polyps. - Schedule second dose of Shingrix vaccine, potentially during September physical. -  Discuss pneumococcal vaccination status and plan accordingly.     Return in about 4 months (around 09/07/2024) for CPE.         Mimi Alt, MD  [facility] [phone] (phone) [phone] (fax)  [facility]

## 2024-05-07 NOTE — Assessment & Plan Note (Signed)
  Hyperlipidemia, Chronic  Hyperlipidemia managed with Crestor  20 mg daily. - Continue Crestor  20 mg daily.

## 2024-05-07 NOTE — Assessment & Plan Note (Signed)
 Current tobacco use Current tobacco use with recent cessation attempt in April, but resumed smoking last week. Expressed motivation to quit and has previously used nicotine gum successfully. Will start Chantix  for smoking cessation. - Start Chantix  starter pack for smoking cessation. - Instruct to send a message upon completion of the starter pack to transition to continuation pack. - Encourage to set a follow-up appointment in September for physical examination.

## 2024-05-07 NOTE — Assessment & Plan Note (Signed)
 Hypertension, Chronic Hypertension with current blood pressure readings of 140/88 mmHg, previously elevated to 150 systolic. Reports occasional non-adherence to medication regimen due to being busy, but no symptoms of dizziness or lightheadedness. Stress is a contributing factor to elevated blood pressure. Manual blood pressure reading showed improvement to 128/82 mmHg. - Continue current antihypertensive regimen: amlodipine  2 mg daily, losartan  100 mg daily, hydrochlorothiazide  25 mg daily. - Recheck blood pressure manually to confirm readings. - Follow up in four months for annual physical.

## 2024-05-13 ENCOUNTER — Other Ambulatory Visit: Payer: Self-pay | Admitting: Family Medicine

## 2024-05-13 DIAGNOSIS — I10 Essential (primary) hypertension: Secondary | ICD-10-CM

## 2024-05-21 ENCOUNTER — Other Ambulatory Visit: Payer: Self-pay

## 2024-05-21 DIAGNOSIS — Z87891 Personal history of nicotine dependence: Secondary | ICD-10-CM

## 2024-05-21 DIAGNOSIS — Z122 Encounter for screening for malignant neoplasm of respiratory organs: Secondary | ICD-10-CM

## 2024-08-22 ENCOUNTER — Other Ambulatory Visit: Payer: Self-pay | Admitting: Family Medicine

## 2024-08-22 DIAGNOSIS — I10 Essential (primary) hypertension: Secondary | ICD-10-CM

## 2024-10-10 ENCOUNTER — Ambulatory Visit: Payer: Self-pay

## 2024-10-10 NOTE — Telephone Encounter (Signed)
 FYI Only or Action Required?: FYI only for provider.  Patient was last seen in primary care on 05/07/2024 by Sharma Coyer, MD.  Called Nurse Triage reporting Nasal Congestion.  Symptoms began yesterday.  Interventions attempted: Nothing.  Symptoms are: gradually worsening.  Triage Disposition: Home Care  Patient/caregiver understands and will follow disposition?: Yes Home care instructions provided. Pt advised to follow-up if symptoms worsen or persist.   Message from [facility] sent at 10/10/2024  9:30 AM EDT  Summary: cold luke symptoms / rx req   Reason for Triage: The patient shares that they are currently experiencing chills, body aches, and nausea for the second day ina row. The patient has tested negative for COVID 19 but would like to speak with a member of staff further when possible about a possible prescription.         Reason for Disposition  Cough  Answer Assessment - Initial Assessment Questions 1. ONSET: When did the cough begin?      Started yesterday  2. SEVERITY: How bad is the cough today?      Getting  3. SPUTUM: Describe the color of your sputum (e.g., none, dry cough; clear, white, yellow, green)     Clear mucus  4. HEMOPTYSIS: Are you coughing up any blood? If Yes, ask: How much? (e.g., flecks, streaks, tablespoons, etc.)     No  5. DIFFICULTY BREATHING: Are you having difficulty breathing? If Yes, ask: How bad is it? (e.g., mild, moderate, severe)      Hard to catch breath after doing ahard cough  6. FEVER: Do you have a fever? If Yes, ask: What is your temperature, how was it measured, and when did it start?     Unsure if she has a fever  7. CARDIAC HISTORY: Do you have any history of heart disease? (e.g., heart attack, congestive heart failure)      HTN  8. LUNG HISTORY: Do you have any history of lung disease?  (e.g., pulmonary embolus, asthma, emphysema)     No 9. PE RISK FACTORS: Do you have a history  of blood clots? (or: recent major surgery, recent prolonged travel, bedridden)     No  10. OTHER SYMPTOMS: Do you have any other symptoms? (e.g., runny nose, wheezing, chest pain)       Congestion, body aches, nausea, vomiting (3x within last 24 hours)  11. PREGNANCY: Is there any chance you are pregnant? When was your last menstrual period?       No  12. TRAVEL: Have you traveled out of the country in the last month? (e.g., travel history, exposures)       *No Answer*  Protocols used: Cough - Acute Productive-A-AH

## 2024-12-03 ENCOUNTER — Encounter: Admitting: Family Medicine

## 2025-02-22 ENCOUNTER — Encounter: Admitting: Family Medicine
